# Patient Record
Sex: Female | Born: 1935 | Race: White | Hispanic: No | Smoking: Never smoker
Health system: Southern US, Community
[De-identification: ages and names within clinical notes are randomized; demographics above are authoritative.]

## PROBLEM LIST (undated history)

## (undated) DIAGNOSIS — N184 Chronic kidney disease, stage 4 (severe): Secondary | ICD-10-CM

## (undated) DIAGNOSIS — E785 Hyperlipidemia, unspecified: Secondary | ICD-10-CM

## (undated) DIAGNOSIS — J189 Pneumonia, unspecified organism: Secondary | ICD-10-CM

## (undated) DIAGNOSIS — I4891 Unspecified atrial fibrillation: Secondary | ICD-10-CM

## (undated) DIAGNOSIS — I10 Essential (primary) hypertension: Secondary | ICD-10-CM

## (undated) DIAGNOSIS — H9190 Unspecified hearing loss, unspecified ear: Secondary | ICD-10-CM

## (undated) DIAGNOSIS — I272 Pulmonary hypertension, unspecified: Secondary | ICD-10-CM

## (undated) DIAGNOSIS — D649 Anemia, unspecified: Secondary | ICD-10-CM

## (undated) DIAGNOSIS — I739 Peripheral vascular disease, unspecified: Secondary | ICD-10-CM

## (undated) DIAGNOSIS — K219 Gastro-esophageal reflux disease without esophagitis: Secondary | ICD-10-CM

## (undated) DIAGNOSIS — I119 Hypertensive heart disease without heart failure: Secondary | ICD-10-CM

## (undated) DIAGNOSIS — N289 Disorder of kidney and ureter, unspecified: Secondary | ICD-10-CM

## (undated) DIAGNOSIS — R42 Dizziness and giddiness: Secondary | ICD-10-CM

## (undated) DIAGNOSIS — I509 Heart failure, unspecified: Secondary | ICD-10-CM

## (undated) DIAGNOSIS — I2510 Atherosclerotic heart disease of native coronary artery without angina pectoris: Secondary | ICD-10-CM

## (undated) HISTORY — DX: Heart failure, unspecified: I50.9

## (undated) HISTORY — DX: Hyperlipidemia, unspecified: E78.5

## (undated) HISTORY — DX: Anemia, unspecified: D64.9

## (undated) HISTORY — DX: Peripheral vascular disease, unspecified: I73.9

---
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
## 2004-12-04 ENCOUNTER — Ambulatory Visit: Payer: Self-pay | Admitting: Family Medicine

## 2006-03-15 ENCOUNTER — Emergency Department: Payer: Self-pay | Admitting: Internal Medicine

## 2006-03-15 ENCOUNTER — Other Ambulatory Visit: Payer: Self-pay

## 2006-10-31 ENCOUNTER — Other Ambulatory Visit: Payer: Self-pay

## 2006-11-01 ENCOUNTER — Inpatient Hospital Stay: Payer: Self-pay | Admitting: Internal Medicine

## 2007-04-03 ENCOUNTER — Ambulatory Visit: Payer: Self-pay | Admitting: Internal Medicine

## 2007-05-26 ENCOUNTER — Ambulatory Visit: Payer: Self-pay | Admitting: Internal Medicine

## 2007-06-30 ENCOUNTER — Ambulatory Visit: Payer: Self-pay | Admitting: Internal Medicine

## 2007-08-03 ENCOUNTER — Ambulatory Visit: Payer: Self-pay | Admitting: Vascular Surgery

## 2009-06-13 ENCOUNTER — Ambulatory Visit: Payer: Self-pay | Admitting: Unknown Physician Specialty

## 2009-10-16 ENCOUNTER — Inpatient Hospital Stay: Payer: Self-pay | Admitting: Vascular Surgery

## 2010-08-01 ENCOUNTER — Ambulatory Visit: Payer: Self-pay | Admitting: Internal Medicine

## 2010-09-05 ENCOUNTER — Ambulatory Visit: Payer: Self-pay | Admitting: Otolaryngology

## 2010-11-07 ENCOUNTER — Ambulatory Visit: Payer: Self-pay | Admitting: Family Medicine

## 2010-11-08 ENCOUNTER — Inpatient Hospital Stay: Payer: Self-pay | Admitting: Surgery

## 2011-03-16 ENCOUNTER — Ambulatory Visit: Payer: Self-pay | Admitting: Internal Medicine

## 2011-03-17 ENCOUNTER — Inpatient Hospital Stay: Payer: Self-pay | Admitting: Internal Medicine

## 2011-05-07 ENCOUNTER — Ambulatory Visit: Payer: Self-pay | Admitting: Gastroenterology

## 2011-05-10 LAB — PATHOLOGY REPORT

## 2014-03-21 DIAGNOSIS — G5600 Carpal tunnel syndrome, unspecified upper limb: Secondary | ICD-10-CM | POA: Insufficient documentation

## 2014-08-15 ENCOUNTER — Ambulatory Visit: Payer: Self-pay | Admitting: Physician Assistant

## 2014-08-27 ENCOUNTER — Ambulatory Visit: Payer: Self-pay | Admitting: Family Medicine

## 2014-09-03 ENCOUNTER — Emergency Department: Payer: Self-pay | Admitting: Emergency Medicine

## 2014-09-03 LAB — COMPREHENSIVE METABOLIC PANEL
Albumin: 3.1 g/dL — ABNORMAL LOW (ref 3.4–5.0)
Alkaline Phosphatase: 85 U/L
Anion Gap: 6 — ABNORMAL LOW (ref 7–16)
BUN: 34 mg/dL — ABNORMAL HIGH (ref 7–18)
Bilirubin,Total: 0.5 mg/dL (ref 0.2–1.0)
Calcium, Total: 8.6 mg/dL (ref 8.5–10.1)
Chloride: 107 mmol/L (ref 98–107)
Co2: 27 mmol/L (ref 21–32)
Creatinine: 1.66 mg/dL — ABNORMAL HIGH (ref 0.60–1.30)
EGFR (African American): 38 — ABNORMAL LOW
EGFR (Non-African Amer.): 31 — ABNORMAL LOW
Glucose: 113 mg/dL — ABNORMAL HIGH (ref 65–99)
Osmolality: 288 (ref 275–301)
Potassium: 4.4 mmol/L (ref 3.5–5.1)
SGOT(AST): 14 U/L — ABNORMAL LOW (ref 15–37)
SGPT (ALT): 9 U/L — ABNORMAL LOW
Sodium: 140 mmol/L (ref 136–145)
Total Protein: 7.1 g/dL (ref 6.4–8.2)

## 2014-09-03 LAB — CBC
HCT: 25.1 % — ABNORMAL LOW (ref 35.0–47.0)
HGB: 8.2 g/dL — ABNORMAL LOW (ref 12.0–16.0)
MCH: 27 pg (ref 26.0–34.0)
MCHC: 32.6 g/dL (ref 32.0–36.0)
MCV: 83 fL (ref 80–100)
Platelet: 158 10*3/uL (ref 150–440)
RBC: 3.04 10*6/uL — ABNORMAL LOW (ref 3.80–5.20)
RDW: 16.2 % — ABNORMAL HIGH (ref 11.5–14.5)
WBC: 5.6 10*3/uL (ref 3.6–11.0)

## 2014-09-08 ENCOUNTER — Encounter: Payer: Self-pay | Admitting: Surgery

## 2014-09-10 ENCOUNTER — Emergency Department: Payer: Self-pay | Admitting: Emergency Medicine

## 2014-09-15 ENCOUNTER — Encounter: Payer: Self-pay | Admitting: Surgery

## 2014-09-22 ENCOUNTER — Encounter: Payer: Self-pay | Admitting: Surgery

## 2014-09-22 DIAGNOSIS — I872 Venous insufficiency (chronic) (peripheral): Secondary | ICD-10-CM | POA: Insufficient documentation

## 2014-09-22 DIAGNOSIS — I272 Pulmonary hypertension, unspecified: Secondary | ICD-10-CM | POA: Insufficient documentation

## 2014-10-03 ENCOUNTER — Encounter: Payer: Self-pay | Admitting: Surgery

## 2014-10-20 DIAGNOSIS — I340 Nonrheumatic mitral (valve) insufficiency: Secondary | ICD-10-CM | POA: Insufficient documentation

## 2014-10-20 DIAGNOSIS — I350 Nonrheumatic aortic (valve) stenosis: Secondary | ICD-10-CM | POA: Insufficient documentation

## 2014-10-20 DIAGNOSIS — I071 Rheumatic tricuspid insufficiency: Secondary | ICD-10-CM | POA: Insufficient documentation

## 2014-11-01 ENCOUNTER — Encounter: Payer: Self-pay | Admitting: Surgery

## 2015-01-05 DIAGNOSIS — I10 Essential (primary) hypertension: Secondary | ICD-10-CM | POA: Insufficient documentation

## 2016-09-12 ENCOUNTER — Encounter: Payer: Self-pay | Admitting: *Deleted

## 2016-09-12 ENCOUNTER — Inpatient Hospital Stay
Admission: EM | Admit: 2016-09-12 | Discharge: 2016-09-16 | DRG: 871 | Disposition: A | Discharge status: Home / Self Care | Payer: Medicare Other | Attending: Internal Medicine | Admitting: Internal Medicine

## 2016-09-12 ENCOUNTER — Emergency Department: Payer: Medicare Other

## 2016-09-12 DIAGNOSIS — A419 Sepsis, unspecified organism: Secondary | ICD-10-CM | POA: Diagnosis not present

## 2016-09-12 DIAGNOSIS — R0602 Shortness of breath: Secondary | ICD-10-CM | POA: Diagnosis not present

## 2016-09-12 DIAGNOSIS — J9601 Acute respiratory failure with hypoxia: Secondary | ICD-10-CM | POA: Diagnosis present

## 2016-09-12 DIAGNOSIS — I129 Hypertensive chronic kidney disease with stage 1 through stage 4 chronic kidney disease, or unspecified chronic kidney disease: Secondary | ICD-10-CM | POA: Diagnosis present

## 2016-09-12 DIAGNOSIS — M6281 Muscle weakness (generalized): Secondary | ICD-10-CM

## 2016-09-12 DIAGNOSIS — R0902 Hypoxemia: Secondary | ICD-10-CM

## 2016-09-12 DIAGNOSIS — N179 Acute kidney failure, unspecified: Secondary | ICD-10-CM | POA: Diagnosis present

## 2016-09-12 DIAGNOSIS — R262 Difficulty in walking, not elsewhere classified: Secondary | ICD-10-CM

## 2016-09-12 DIAGNOSIS — N189 Chronic kidney disease, unspecified: Secondary | ICD-10-CM | POA: Diagnosis present

## 2016-09-12 DIAGNOSIS — R2681 Unsteadiness on feet: Secondary | ICD-10-CM

## 2016-09-12 DIAGNOSIS — J189 Pneumonia, unspecified organism: Secondary | ICD-10-CM | POA: Diagnosis present

## 2016-09-12 DIAGNOSIS — Z79899 Other long term (current) drug therapy: Secondary | ICD-10-CM | POA: Diagnosis not present

## 2016-09-12 DIAGNOSIS — J181 Lobar pneumonia, unspecified organism: Secondary | ICD-10-CM

## 2016-09-12 DIAGNOSIS — R0781 Pleurodynia: Secondary | ICD-10-CM

## 2016-09-12 HISTORY — DX: Essential (primary) hypertension: I10

## 2016-09-12 LAB — CBC WITH DIFFERENTIAL/PLATELET
Basophils Absolute: 0.1 10*3/uL (ref 0–0.1)
Basophils Relative: 1 %
Eosinophils Absolute: 0 10*3/uL (ref 0–0.7)
Eosinophils Relative: 0 %
HCT: 30.6 % — ABNORMAL LOW (ref 35.0–47.0)
Hemoglobin: 10.4 g/dL — ABNORMAL LOW (ref 12.0–16.0)
Lymphocytes Relative: 13 %
Lymphs Abs: 1 10*3/uL (ref 1.0–3.6)
MCH: 27.9 pg (ref 26.0–34.0)
MCHC: 33.8 g/dL (ref 32.0–36.0)
MCV: 82.4 fL (ref 80.0–100.0)
Monocytes Absolute: 1.2 10*3/uL — ABNORMAL HIGH (ref 0.2–0.9)
Monocytes Relative: 15 %
Neutro Abs: 5.4 10*3/uL (ref 1.4–6.5)
Neutrophils Relative %: 71 %
Platelets: 102 10*3/uL — ABNORMAL LOW (ref 150–440)
RBC: 3.72 MIL/uL — ABNORMAL LOW (ref 3.80–5.20)
RDW: 15.8 % — ABNORMAL HIGH (ref 11.5–14.5)
WBC: 7.7 10*3/uL (ref 3.6–11.0)

## 2016-09-12 LAB — COMPREHENSIVE METABOLIC PANEL
ALT: 10 U/L — ABNORMAL LOW (ref 14–54)
AST: 23 U/L (ref 15–41)
Albumin: 3.8 g/dL (ref 3.5–5.0)
Alkaline Phosphatase: 72 U/L (ref 38–126)
Anion gap: 12 (ref 5–15)
BUN: 33 mg/dL — ABNORMAL HIGH (ref 6–20)
CO2: 21 mmol/L — ABNORMAL LOW (ref 22–32)
Calcium: 8.3 mg/dL — ABNORMAL LOW (ref 8.9–10.3)
Chloride: 104 mmol/L (ref 101–111)
Creatinine, Ser: 1.38 mg/dL — ABNORMAL HIGH (ref 0.44–1.00)
GFR calc Af Amer: 39 mL/min — ABNORMAL LOW (ref >60)
GFR calc non Af Amer: 34 mL/min — ABNORMAL LOW (ref >60)
Glucose, Bld: 126 mg/dL — ABNORMAL HIGH (ref 65–99)
Potassium: 3.7 mmol/L (ref 3.5–5.1)
Sodium: 137 mmol/L (ref 135–145)
Total Bilirubin: 0.5 mg/dL (ref 0.3–1.2)
Total Protein: 7.5 g/dL (ref 6.5–8.1)

## 2016-09-12 LAB — CBC
HCT: 29 % — ABNORMAL LOW (ref 35.0–47.0)
Hemoglobin: 9.7 g/dL — ABNORMAL LOW (ref 12.0–16.0)
MCH: 27.8 pg (ref 26.0–34.0)
MCHC: 33.5 g/dL (ref 32.0–36.0)
MCV: 83 fL (ref 80.0–100.0)
Platelets: 100 10*3/uL — ABNORMAL LOW (ref 150–440)
RBC: 3.49 MIL/uL — ABNORMAL LOW (ref 3.80–5.20)
RDW: 15.9 % — ABNORMAL HIGH (ref 11.5–14.5)
WBC: 11.6 10*3/uL — ABNORMAL HIGH (ref 3.6–11.0)

## 2016-09-12 LAB — CREATININE, SERUM
Creatinine, Ser: 1.48 mg/dL — ABNORMAL HIGH (ref 0.44–1.00)
GFR calc Af Amer: 36 mL/min — ABNORMAL LOW (ref >60)
GFR calc non Af Amer: 31 mL/min — ABNORMAL LOW (ref >60)

## 2016-09-12 LAB — RAPID INFLUENZA A&B ANTIGENS
Influenza A (ARMC): NEGATIVE
Influenza B (ARMC): NEGATIVE

## 2016-09-12 LAB — LACTIC ACID, PLASMA: Lactic Acid, Venous: 0.7 mmol/L (ref 0.5–1.9)

## 2016-09-12 LAB — GLUCOSE, CAPILLARY: Glucose-Capillary: 96 mg/dL (ref 65–99)

## 2016-09-12 MED ORDER — ACETAMINOPHEN 500 MG PO TABS
ORAL_TABLET | ORAL | Status: AC
  Administered 2016-09-12: 1000 mg via ORAL
  Filled 2016-09-12: qty 2

## 2016-09-12 MED ORDER — ACETAMINOPHEN 500 MG PO TABS
1000.0000 mg | ORAL_TABLET | Freq: Once | ORAL | Status: AC
Start: 2016-09-12 — End: 2016-09-12
  Administered 2016-09-12: 1000 mg via ORAL

## 2016-09-12 MED ORDER — IRBESARTAN 75 MG PO TABS
300.0000 mg | ORAL_TABLET | Freq: Every day | ORAL | Status: DC
  Administered 2016-09-12 – 2016-09-13 (×2): 300 mg via ORAL
  Filled 2016-09-12: qty 1
  Filled 2016-09-12 (×2): qty 4

## 2016-09-12 MED ORDER — DEXTROSE 5 % IV SOLN
500.0000 mg | INTRAVENOUS | Status: DC
  Administered 2016-09-12 – 2016-09-15 (×4): 500 mg via INTRAVENOUS
  Filled 2016-09-12 (×5): qty 500

## 2016-09-12 MED ORDER — CEFTRIAXONE SODIUM-DEXTROSE 1-3.74 GM-% IV SOLR
INTRAVENOUS | Status: DC
Start: 2016-09-12 — End: 2016-09-12
  Filled 2016-09-12: qty 50

## 2016-09-12 MED ORDER — DEXTROSE 5 % IV SOLN
INTRAVENOUS | Status: AC
  Administered 2016-09-12: 500 mg via INTRAVENOUS
  Filled 2016-09-12: qty 500

## 2016-09-12 MED ORDER — IPRATROPIUM-ALBUTEROL 0.5-2.5 (3) MG/3ML IN SOLN
3.0000 mL | RESPIRATORY_TRACT | Status: DC
Start: 2016-09-12 — End: 2016-09-13
  Administered 2016-09-12 – 2016-09-13 (×3): 3 mL via RESPIRATORY_TRACT
  Filled 2016-09-12 (×3): qty 3

## 2016-09-12 MED ORDER — SODIUM CHLORIDE 0.9 % IV BOLUS (SEPSIS)
500.0000 mL | Freq: Once | INTRAVENOUS | Status: AC
  Administered 2016-09-12: 500 mL via INTRAVENOUS

## 2016-09-12 MED ORDER — AMLODIPINE BESYLATE 10 MG PO TABS
10.0000 mg | ORAL_TABLET | Freq: Every day | ORAL | Status: DC
  Administered 2016-09-12 – 2016-09-16 (×5): 10 mg via ORAL
  Filled 2016-09-12 (×5): qty 1

## 2016-09-12 MED ORDER — SODIUM CHLORIDE 0.9% FLUSH
3.0000 mL | Freq: Two times a day (BID) | INTRAVENOUS | Status: DC
  Administered 2016-09-12 – 2016-09-16 (×8): 3 mL via INTRAVENOUS

## 2016-09-12 MED ORDER — ONDANSETRON 4 MG PO TBDP
4.0000 mg | ORAL_TABLET | Freq: Once | ORAL | Status: AC
  Administered 2016-09-12: 4 mg via ORAL
  Filled 2016-09-12: qty 1

## 2016-09-12 MED ORDER — VANCOMYCIN HCL IN DEXTROSE 1-5 GM/200ML-% IV SOLN
INTRAVENOUS | Status: AC
  Filled 2016-09-12: qty 200

## 2016-09-12 MED ORDER — CLONIDINE HCL 0.3 MG/24HR TD PTWK
0.3000 mg | MEDICATED_PATCH | TRANSDERMAL | Status: DC
  Filled 2016-09-12: qty 1

## 2016-09-12 MED ORDER — ACETAMINOPHEN 325 MG PO TABS
650.0000 mg | ORAL_TABLET | Freq: Four times a day (QID) | ORAL | Status: DC | PRN
  Administered 2016-09-12: 650 mg via ORAL
  Filled 2016-09-12: qty 2

## 2016-09-12 MED ORDER — PIPERACILLIN-TAZOBACTAM 3.375 G IVPB 30 MIN
3.3750 g | Freq: Once | INTRAVENOUS | Status: AC
  Administered 2016-09-12: 3.375 g via INTRAVENOUS
  Filled 2016-09-12: qty 50

## 2016-09-12 MED ORDER — ONDANSETRON HCL 4 MG PO TABS: 4.0000 mg | ORAL_TABLET | Freq: Four times a day (QID) | ORAL | Status: DC | PRN

## 2016-09-12 MED ORDER — CEFTRIAXONE SODIUM 1 G IJ SOLR: 1.0000 g | INTRAMUSCULAR | Status: DC

## 2016-09-12 MED ORDER — GUAIFENESIN ER 600 MG PO TB12
600.0000 mg | ORAL_TABLET | Freq: Two times a day (BID) | ORAL | Status: DC
  Administered 2016-09-12 – 2016-09-16 (×8): 600 mg via ORAL
  Filled 2016-09-12 (×11): qty 1

## 2016-09-12 MED ORDER — CARVEDILOL 6.25 MG PO TABS
6.2500 mg | ORAL_TABLET | Freq: Two times a day (BID) | ORAL | Status: DC
  Administered 2016-09-12 – 2016-09-13 (×2): 6.25 mg via ORAL
  Filled 2016-09-12 (×3): qty 1

## 2016-09-12 MED ORDER — ONDANSETRON HCL 4 MG/2ML IJ SOLN: 4.0000 mg | Freq: Four times a day (QID) | INTRAMUSCULAR | Status: DC | PRN

## 2016-09-12 MED ORDER — ENOXAPARIN SODIUM 30 MG/0.3ML ~~LOC~~ SOLN
30.0000 mg | SUBCUTANEOUS | Status: DC
  Administered 2016-09-12 – 2016-09-15 (×4): 30 mg via SUBCUTANEOUS
  Filled 2016-09-12 (×4): qty 0.3

## 2016-09-12 MED ORDER — TRAMADOL HCL 50 MG PO TABS: 50.0000 mg | ORAL_TABLET | Freq: Four times a day (QID) | ORAL | Status: DC | PRN

## 2016-09-12 MED ORDER — PANTOPRAZOLE SODIUM 40 MG PO TBEC
40.0000 mg | DELAYED_RELEASE_TABLET | Freq: Every day | ORAL | Status: DC
  Administered 2016-09-12 – 2016-09-16 (×5): 40 mg via ORAL
  Filled 2016-09-12 (×5): qty 1

## 2016-09-12 MED ORDER — IPRATROPIUM-ALBUTEROL 0.5-2.5 (3) MG/3ML IN SOLN
3.0000 mL | Freq: Once | RESPIRATORY_TRACT | Status: AC
  Administered 2016-09-12: 3 mL via RESPIRATORY_TRACT

## 2016-09-12 MED ORDER — IPRATROPIUM-ALBUTEROL 0.5-2.5 (3) MG/3ML IN SOLN
RESPIRATORY_TRACT | Status: AC
  Filled 2016-09-12: qty 3

## 2016-09-12 MED ORDER — CEFTRIAXONE SODIUM-DEXTROSE 1-3.74 GM-% IV SOLR
1.0000 g | INTRAVENOUS | Status: DC
  Administered 2016-09-12 – 2016-09-15 (×4): 1 g via INTRAVENOUS
  Filled 2016-09-12 (×5): qty 50

## 2016-09-12 MED ORDER — ACETAMINOPHEN 650 MG RE SUPP: 650.0000 mg | Freq: Four times a day (QID) | RECTAL | Status: DC | PRN

## 2016-09-12 MED ORDER — VANCOMYCIN HCL IN DEXTROSE 1-5 GM/200ML-% IV SOLN
1000.0000 mg | Freq: Once | INTRAVENOUS | Status: AC
  Administered 2016-09-12: 1000 mg via INTRAVENOUS
  Filled 2016-09-12: qty 200

## 2016-09-12 MED ORDER — HYDROCOD POLST-CPM POLST ER 10-8 MG/5ML PO SUER
5.0000 mL | Freq: Two times a day (BID) | ORAL | Status: DC
  Administered 2016-09-12 – 2016-09-16 (×9): 5 mL via ORAL
  Filled 2016-09-12 (×10): qty 5

## 2016-09-12 MED ORDER — ALBUTEROL SULFATE (2.5 MG/3ML) 0.083% IN NEBU: 2.5000 mg | INHALATION_SOLUTION | Freq: Four times a day (QID) | RESPIRATORY_TRACT | Status: DC

## 2016-09-12 MED ORDER — IPRATROPIUM-ALBUTEROL 0.5-2.5 (3) MG/3ML IN SOLN
RESPIRATORY_TRACT | Status: AC
  Administered 2016-09-12: 3 mL via RESPIRATORY_TRACT
  Filled 2016-09-12: qty 3

## 2016-09-12 NOTE — ED Provider Notes (Signed)
Time Seen: Approximately 0915 I have reviewed the triage notes  Chief Complaint: Fever and Cough   History of Present Illness: Felicia [name] is a 83 y.o. female who presents with acute onset no fever, productive cough, and some shortness of breath. Patient was transported here by EMS from home. She arrives with a temperature of 102.9 orally and pulse ox of 89% on room air. She denies any chest pain. She describes her cough is "" wet, laterally "" cough. She denies any hemoptysis though green tinged sputum. She denies any pulmonary emboli risk factors. She states she felt fine the previous evening.   Past Medical History:  Diagnosis Date  . Hypertension     There are no active problems to display for this patient.   History reviewed. No pertinent surgical history.  History reviewed. No pertinent surgical history.    Allergies:  Patient has no known allergies.  Family History: No family history on file.  Social History: Social History  Substance Use Topics  . Smoking status: Never Smoker  . Smokeless tobacco: Never Used  . 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   Review of Systems:   10 point review of systems was performed and was otherwise negative:  Constitutional: Fever noted above Eyes: No visual disturbances ENT: No sore throat, ear pain Cardiac: No chest pain Respiratory: Shortness of breath especially while lying flat Abdomen: No abdominal pain, no vomiting, No diarrhea Endocrine: No weight loss, No night sweats Extremities: No peripheral edema, cyanosis Skin: No rashes, easy bruising Neurologic: No focal weakness, trouble with speech or swollowing Urologic: No dysuria, Hematuria, or urinary frequency   Physical Exam:  ED Triage Vitals  Enc Vitals Group     BP 09/12/16 0836 (!) 208/76     Pulse Rate 09/12/16 0836 98     Resp 09/12/16 0836 18     Temp 09/12/16 0836 (!) 102.9 F (39.4 C)     Temp  Source 09/12/16 0836 Oral     SpO2 09/12/16 0836 (!) 89 %     Weight 09/12/16 0837 190 lb (86.2 kg)     Height 09/12/16 0837 5\' 9"  (1.753 m)     Head Circumference --      Peak Flow --      Pain Score --      Pain Loc --      Pain Edu? --      Excl. in [place]? --     General: Awake , Alert , and Oriented times 3; GCS 15 Patient slightly tachypneic though able to speak in full and complete sentences with wet sounding cough, no wheezing Head: Normal cephalic , atraumatic Eyes: Pupils equal , round, reactive to light Nose/Throat: No nasal drainage, patent upper airway without erythema or exudate.  Neck: Supple, Full range of motion, No anterior adenopathy or palpable thyroid masses. No meningeal signs Lungs: Coarse breath sounds auscultated bilaterally especially at the bases without wheezing noted  Heart: Regular rate, regular rhythm without murmurs , gallops , or rubs Abdomen: Soft, non tender without rebound, guarding , or rigidity; bowel sounds positive and symmetric in all 4 quadrants. No organomegaly .        Extremities: 2 plus symmetric pulses. No edema, clubbing or cyanosis Neurologic: normal ambulation, Motor symmetric without deficits, sensory intact Skin: warm, dry, no rashes   Labs:   All laboratory work was reviewed including any pertinent negatives or positives listed below:  Labs  Reviewed  CBC WITH DIFFERENTIAL/PLATELET - Abnormal; Notable for the following:       Result Value   RBC 3.72 (*)    Hemoglobin 10.4 (*)    HCT 30.6 (*)    RDW 15.8 (*)    Platelets 102 (*)    Monocytes Absolute 1.2 (*)    All other components within normal limits  COMPREHENSIVE METABOLIC PANEL - Abnormal; Notable for the following:    CO2 21 (*)    Glucose, Bld 126 (*)    BUN 33 (*)    Creatinine, Ser 1.38 (*)    Calcium 8.3 (*)    ALT 10 (*)    GFR calc non Af Amer 34 (*)    GFR calc Af Amer 39 (*)    All other components within normal limits  RAPID INFLUENZA A&B ANTIGENS ([facility]  ONLY)  CULTURE, BLOOD (ROUTINE X 2)  CULTURE, BLOOD (ROUTINE X 2)  LACTIC ACID, PLASMA    Radiology:  "Dg Chest 2 View  Result Date: 09/12/2016 CLINICAL DATA:  Productive cough, fever. EXAM: CHEST  2 VIEW COMPARISON:  Radiographs of March 17, 2011. FINDINGS: Stable cardiomediastinal silhouette. Atherosclerosis of thoracic aorta is noted. No pneumothorax is noted. Right lung is clear. Mild left basilar opacity is noted concerning for pneumonia or atelectasis. Bony thorax is unremarkable. IMPRESSION: Left basilar pneumonia or atelectasis. Aortic atherosclerosis. Followup PA and lateral chest X-ray is recommended in 3-4 weeks following trial of antibiotic therapy to ensure resolution and exclude underlying malignancy. Electronically Signed   By: Marijo Conception, M.D.   On: 09/12/2016 09:27  "  I personally reviewed the radiologic studies    ED Course:  Patient arrives slightly hypoxic. Patient has the appearance of community-acquired pneumonia with fever and infiltrate seen on chest x-ray with a negative flu study. Patient was placed on a 2 L nasal cannula which improved her pulse ox up into the mid 90s. She was given a DuoNeb had been started on Zosyn and IV vancomycin with thoughts we may be able to treat and discharge her. However, after the breathing treatment and antibiotics she was ambulated here in emergency department her pulse ox dropped to 76% on room air. Clinical Course      Assessment:  Community-acquired pneumonia Hypoxia      Plan:  Inpatient            Daymon Larsen, MD 09/12/16 1241

## 2016-09-12 NOTE — ED Notes (Signed)
Pt resting in bed, resp even and unlabored, family at bedside

## 2016-09-12 NOTE — ED Notes (Signed)
Patient ambulated in hall with no oxygen. Patient SOB and SpO2 dropped to 76%. Patient returned to room and placed on 2L [place]. SpO2 up to 95%.

## 2016-09-12 NOTE — ED Triage Notes (Signed)
Pt arrives via EMS from home with a fever and productive green cough, pt awake upon arrival, states symptoms began this AM, EDP at bedside, rhonchi in lung fields

## 2016-09-12 NOTE — ED Notes (Signed)
Soiled brief changed at this time. Clean, dry brief applied. Linens changed.

## 2016-09-12 NOTE — ED Notes (Signed)
Pt placed on 2L [place]

## 2016-09-12 NOTE — ED Notes (Signed)
Pt sleeping in room on 2L via [place]. Pt's sat 86%. Pt increased to 4L via [place].

## 2016-09-12 NOTE — H&P (Signed)
[place] at [place] NAME: Felicia [name]    MR#:  [id number]  DATE OF BIRTH:  [birth year]  DATE OF ADMISSION:  09/12/2016  PRIMARY CARE PHYSICIAN: No primary care provider on file.   REQUESTING/REFERRING PHYSICIAN:   CHIEF COMPLAINT:   Chief Complaint  Patient presents with  . Fever  . Cough    HISTORY OF PRESENT ILLNESS: Felicia [name]  is a 83 y.o. female with a known history of Essential hypertension, who presents to the hospital with complaints of cough, yellow sputum production, shortness of breath, wheezing, left-sided lower thoracic pains, chills. On arrival to emergency room, she was noted to have high fever to 102.9, tachycardic, which is x-ray revealed pneumonia, hospitalist services were contacted for admission  PAST MEDICAL HISTORY:   Past Medical History:  Diagnosis Date  . Hypertension     PAST SURGICAL HISTORY: History reviewed. No pertinent surgical history.  SOCIAL HISTORY:  Social History  Substance Use Topics  . Smoking status: Never Smoker  . Smokeless tobacco: Never Used  . Alcohol use No    FAMILY HISTORY: No family history on file.  DRUG ALLERGIES: No Known Allergies  Review of Systems  Constitutional: Positive for chills. Negative for fever and weight loss.  HENT: Negative for congestion.   Eyes: Negative for blurred vision and double vision.  Respiratory: Positive for cough, sputum production, shortness of breath and wheezing.   Cardiovascular: Positive for chest pain. Negative for palpitations, orthopnea, leg swelling and PND.  Gastrointestinal: Positive for nausea. Negative for abdominal pain, blood in stool, constipation, diarrhea and vomiting.  Genitourinary: Negative for dysuria, frequency, hematuria and urgency.  Musculoskeletal: Negative for falls.  Neurological: Positive for weakness. Negative for dizziness, tremors, focal weakness and headaches.  Endo/Heme/Allergies: Does not  bruise/bleed easily.  Psychiatric/Behavioral: Negative for depression. The patient does not have insomnia.     MEDICATIONS AT HOME:  Prior to Admission medications   Medication Sig Start Date End Date Taking? Authorizing Provider  amLODipine (NORVASC) 10 MG tablet Take 10 mg by mouth daily.   Yes Historical Provider, MD  carvedilol (COREG) 6.25 MG tablet Take 6.25 mg by mouth 2 (two) times daily with a meal.   Yes Historical Provider, MD  cloNIDine (CATAPRES - DOSED IN MG/24 HR) 0.3 mg/24hr patch Place 0.3 mg onto the skin once a week.   Yes Historical Provider, MD  Ferrous Sulfate (IRON) 325 (65 Fe) MG TABS Take by mouth.   Yes Historical Provider, MD  omeprazole (PRILOSEC) 20 MG capsule Take 20 mg by mouth daily.   Yes Historical Provider, MD  telmisartan (MICARDIS) 80 MG tablet Take 80 mg by mouth daily.   Yes Historical Provider, MD      PHYSICAL EXAMINATION:   VITAL SIGNS: Blood pressure (!) 141/41, pulse 75, temperature 98.8 F (37.1 C), temperature source Oral, resp. rate 15, height 5\' 9"  (1.753 m), weight 86.2 kg (190 lb), SpO2 95 %.  GENERAL:  83 y.o.-year-old patient lying in the bed In mild-to-moderate respiratory distress due to dyspnea and intermittent cough, rattling in the chest.  EYES: Pupils equal, round, reactive to light and accommodation. No scleral icterus. Extraocular muscles intact.  HEENT: Head atraumatic, normocephalic. Oropharynx and nasopharynx clear.  NECK:  Supple, no jugular venous distention. No thyroid enlargement, no tenderness.  LUNGS: Some diminished breath sounds bilaterally, no wheezing, bilateral copious rales,rhonchi and crepitations noted. Intermittent use of accessory muscles of respiration, with movements and speech.  CARDIOVASCULAR: S1,  S2 , tachycardic. No murmurs, rubs, or gallops.  ABDOMEN: Soft, nontender, nondistended. Bowel sounds present. No organomegaly or mass.  EXTREMITIES: No pedal edema, cyanosis, or clubbing.  NEUROLOGIC: Cranial  nerves II through XII are intact. Muscle strength 5/5 in all extremities. Sensation intact. Gait not checked.  PSYCHIATRIC: The patient is alert and oriented x 3.  SKIN: No obvious rash, lesion, or ulcer.   LABORATORY PANEL:   CBC  Recent Labs Lab 09/12/16 0843  WBC 7.7  HGB 10.4*  HCT 30.6*  PLT 102*  MCV 82.4  MCH 27.9  MCHC 33.8  RDW 15.8*  LYMPHSABS 1.0  MONOABS 1.2*  EOSABS 0.0  BASOSABS 0.1   ------------------------------------------------------------------------------------------------------------------  Chemistries   Recent Labs Lab 09/12/16 0843  NA 137  K 3.7  CL 104  CO2 21*  GLUCOSE 126*  BUN 33*  CREATININE 1.38*  CALCIUM 8.3*  AST 23  ALT 10*  ALKPHOS 72  BILITOT 0.5   ------------------------------------------------------------------------------------------------------------------  Cardiac Enzymes No results for input(s): TROPONINI in the last 168 hours. ------------------------------------------------------------------------------------------------------------------  RADIOLOGY: Dg Chest 2 View  Result Date: 09/12/2016 CLINICAL DATA:  Productive cough, fever. EXAM: CHEST  2 VIEW COMPARISON:  Radiographs of March 17, 2011. FINDINGS: Stable cardiomediastinal silhouette. Atherosclerosis of thoracic aorta is noted. No pneumothorax is noted. Right lung is clear. Mild left basilar opacity is noted concerning for pneumonia or atelectasis. Bony thorax is unremarkable. IMPRESSION: Left basilar pneumonia or atelectasis. Aortic atherosclerosis. Followup PA and lateral chest X-ray is recommended in 3-4 weeks following trial of antibiotic therapy to ensure resolution and exclude underlying malignancy. Electronically Signed   By: Marijo Conception, M.D.   On: 09/12/2016 09:27    EKG: Orders placed or performed in visit on 10/31/06  . EKG 12-Lead    IMPRESSION AND PLAN:  Active Problems:   Sepsis ([place])   Community acquired pneumonia   Hypoxia    Pleuritic chest pain   Pneumonia  #1. Sepsis due to community-acquired pneumonia, admit patient to medical floor, initiated on Rocephin and Zithromax,, get sputum culture #2. Community-acquired pneumonia, continue antibiotic therapy, sputum culture, just antibiotics depending on culture results #3. Hypoxia, oxygen therapy, wean off oxygen as tolerated, patient is not on oxygen at home #4. Chest pain, pleuritic, due to left-sided pneumonia, supportive therapy, tramadol when necessary #5 chronic renal insufficiency, seems to be stable since 2016 #6. Malignant essential hypertension, continue outpatient medications, advanced him as needed  All the records are reviewed and case discussed with ED provider. Management plans discussed with the patient, family and they are in agreement.  CODE STATUS: Code Status History    This patient does not have a recorded code status. Please follow your organizational policy for patients in this situation.       TOTAL TIME TAKING CARE OF THIS PATIENT: 50 minutes.    Theodoro Grist M.D on 09/12/2016 at 1:33 PM  Between 7am to 6pm - Pager - [phone] After 6pm go to [email or web address] - password [email or web address] [facility]  [facility]  Office  [phone]  CC: Primary care physician; No primary care provider on file.

## 2016-09-13 LAB — BLOOD CULTURE ID PANEL (REFLEXED)

## 2016-09-13 LAB — BASIC METABOLIC PANEL
Anion gap: 7 (ref 5–15)
BUN: 42 mg/dL — ABNORMAL HIGH (ref 6–20)
CO2: 25 mmol/L (ref 22–32)
Calcium: 7.5 mg/dL — ABNORMAL LOW (ref 8.9–10.3)
Chloride: 105 mmol/L (ref 101–111)
Creatinine, Ser: 1.86 mg/dL — ABNORMAL HIGH (ref 0.44–1.00)
GFR calc Af Amer: 27 mL/min — ABNORMAL LOW (ref >60)
GFR calc non Af Amer: 24 mL/min — ABNORMAL LOW (ref >60)
Glucose, Bld: 99 mg/dL (ref 65–99)
Potassium: 3.7 mmol/L (ref 3.5–5.1)
Sodium: 137 mmol/L (ref 135–145)

## 2016-09-13 LAB — CBC
HCT: 28.2 % — ABNORMAL LOW (ref 35.0–47.0)
Hemoglobin: 9.4 g/dL — ABNORMAL LOW (ref 12.0–16.0)
MCH: 27.5 pg (ref 26.0–34.0)
MCHC: 33.3 g/dL (ref 32.0–36.0)
MCV: 82.5 fL (ref 80.0–100.0)
Platelets: 93 10*3/uL — ABNORMAL LOW (ref 150–440)
RBC: 3.42 MIL/uL — ABNORMAL LOW (ref 3.80–5.20)
RDW: 15.8 % — ABNORMAL HIGH (ref 11.5–14.5)
WBC: 16.3 10*3/uL — ABNORMAL HIGH (ref 3.6–11.0)

## 2016-09-13 LAB — PROCALCITONIN: Procalcitonin: 7.14 ng/mL

## 2016-09-13 MED ORDER — IPRATROPIUM-ALBUTEROL 0.5-2.5 (3) MG/3ML IN SOLN
3.0000 mL | Freq: Four times a day (QID) | RESPIRATORY_TRACT | Status: DC
  Administered 2016-09-13 – 2016-09-16 (×11): 3 mL via RESPIRATORY_TRACT
  Filled 2016-09-13 (×12): qty 3

## 2016-09-13 NOTE — Care Management (Addendum)
Patient admitted with pneumonia.  She lives alone and is employed at [employer] in [place].  She is able to drive, current with her cardiologist and PCP at [facility] clinic.  She is independent in her adls, and no issues paying for meds.  02 requirements are acute.  Discussed weaning 02 and mobilization of patient to prevent decline in her functional status during progression.

## 2016-09-13 NOTE — Progress Notes (Signed)
PHARMACY - PHYSICIAN COMMUNICATION CRITICAL VALUE ALERT - BLOOD CULTURE IDENTIFICATION (BCID)  Results for orders placed or performed during the hospital encounter of 09/12/16  Blood Culture ID Panel (Reflexed) (Collected: 09/12/2016  8:43 AM)  Result Value Ref Range   Enterococcus species NOT DETECTED NOT DETECTED   Listeria monocytogenes NOT DETECTED NOT DETECTED   Staphylococcus species DETECTED (A) NOT DETECTED   Staphylococcus aureus NOT DETECTED NOT DETECTED   Methicillin resistance NOT DETECTED NOT DETECTED   Streptococcus species NOT DETECTED NOT DETECTED   Streptococcus agalactiae NOT DETECTED NOT DETECTED   Streptococcus pneumoniae NOT DETECTED NOT DETECTED   Streptococcus pyogenes NOT DETECTED NOT DETECTED   Acinetobacter baumannii NOT DETECTED NOT DETECTED   Enterobacteriaceae species NOT DETECTED NOT DETECTED   Enterobacter cloacae complex NOT DETECTED NOT DETECTED   Escherichia coli NOT DETECTED NOT DETECTED   Klebsiella oxytoca NOT DETECTED NOT DETECTED   Klebsiella pneumoniae NOT DETECTED NOT DETECTED   Proteus species NOT DETECTED NOT DETECTED   Serratia marcescens NOT DETECTED NOT DETECTED   Haemophilus influenzae NOT DETECTED NOT DETECTED   Neisseria meningitidis NOT DETECTED NOT DETECTED   Pseudomonas aeruginosa NOT DETECTED NOT DETECTED   Candida albicans NOT DETECTED NOT DETECTED   Candida glabrata NOT DETECTED NOT DETECTED   Candida krusei NOT DETECTED NOT DETECTED   Candida parapsilosis NOT DETECTED NOT DETECTED   Candida tropicalis NOT DETECTED NOT DETECTED    Name of physician (or Provider) Contacted: [place]our[place]  Changes to prescribed antibiotics required: No changes. Probable contaminant, continue to monitor.  Aiysha Jillson C 09/13/2016  4:18 PM

## 2016-09-13 NOTE — Clinical Social Work Note (Signed)
CSW acknowledge consult for patient possibly needing SNF placement for rehab.  Awaiting PT recommendations to determine if patient will need to go to SNF for rehab.  CSW to continue to follow patient's progress throughout discharge planning.  Jones Broom. [place], MSW, [place]  09/13/2016 5:22 PM

## 2016-09-13 NOTE — Care Management Important Message (Signed)
Important Message  Patient Details  Name: Felicia [name] MRN: [id number] Date of Birth: 1928-09-21   Medicare Important Message Given:  Yes Initial signed IM printed from Epic and given to patient.   Katrina Stack, RN 09/13/2016, 8:08 AM

## 2016-09-13 NOTE — Progress Notes (Signed)
Increased to 4l after neb due to sat of 89% on 3lnc

## 2016-09-13 NOTE — Progress Notes (Signed)
[place] at [place] NAME: Felicia [name]    MR#:  [id number]  DATE OF BIRTH:  July 09, 1929  SUBJECTIVE:  CHIEF COMPLAINT:  Patient is reporting cough. Denies any shortness of breath today   REVIEW OF SYSTEMS:  CONSTITUTIONAL: No fever, fatigue or weakness.  EYES: No blurred or double vision.  EARS, NOSE, AND THROAT: No tinnitus or ear pain.  RESPIRATORY: Reporting cough, denies shortness of breath, wheezing or hemoptysis.  CARDIOVASCULAR: No chest pain, orthopnea, edema.  GASTROINTESTINAL: No nausea, vomiting, diarrhea or abdominal pain.  GENITOURINARY: No dysuria, hematuria.  ENDOCRINE: No polyuria, nocturia,  HEMATOLOGY: No anemia, easy bruising or bleeding SKIN: No rash or lesion. MUSCULOSKELETAL: No joint pain or arthritis.   NEUROLOGIC: No tingling, numbness, weakness.  PSYCHIATRY: No anxiety or depression.   DRUG ALLERGIES:  No Known Allergies  VITALS:  Blood pressure (!) 96/38, pulse (!) 56, temperature 98.9 F (37.2 C), temperature source Oral, resp. rate 16, height _0  (1.753 m), weight 86.4 kg (190 lb 8 oz), SpO2 (!) 89 %.  PHYSICAL EXAMINATION:  GENERAL:  83 y.o.-year-old patient lying in the bed with no acute distress.  EYES: Pupils equal, round, reactive to light and accommodation. No scleral icterus. Extraocular muscles intact.  HEENT: Head atraumatic, normocephalic. Oropharynx and nasopharynx clear.  NECK:  Supple, no jugular venous distention. No thyroid enlargement, no tenderness.  LUNGS: Diminished breath sounds at left lower lobe no wheezing, rales,rhonchi or crepitation. No use of accessory muscles of respiration.  CARDIOVASCULAR: S1, S2 normal. No murmurs, rubs, or gallops.  ABDOMEN: Soft, nontender, nondistended. Bowel sounds present. No organomegaly or mass.  EXTREMITIES: No pedal edema, cyanosis, or clubbing.  NEUROLOGIC: Cranial nerves II through XII are intact. Muscle strength 5/5 in all  extremities. Sensation intact. Gait not checked.  PSYCHIATRIC: The patient is alert and oriented x 3.  SKIN: No obvious rash, lesion, or ulcer.    LABORATORY PANEL:   CBC  Recent Labs Lab 09/13/16 0429  WBC 16.3*  HGB 9.4*  HCT 28.2*  PLT 93*   ------------------------------------------------------------------------------------------------------------------  Chemistries   Recent Labs Lab 09/12/16 0843  09/13/16 0429  NA 137  --  137  K 3.7  --  3.7  CL 104  --  105  CO2 21*  --  25  GLUCOSE 126*  --  99  BUN 33*  --  42*  CREATININE 1.38*  < > 1.86*  CALCIUM 8.3*  --  7.5*  AST 23  --   --   ALT 10*  --   --   ALKPHOS 72  --   --   BILITOT 0.5  --   --   < > = values in this interval not displayed. ------------------------------------------------------------------------------------------------------------------  Cardiac Enzymes No results for input(s): TROPONINI in the last 168 hours. ------------------------------------------------------------------------------------------------------------------  RADIOLOGY:  Dg Chest 2 View  Result Date: 09/12/2016 CLINICAL DATA:  Productive cough, fever. EXAM: CHEST  2 VIEW COMPARISON:  Radiographs of March 17, 2011. FINDINGS: Stable cardiomediastinal silhouette. Atherosclerosis of thoracic aorta is noted. No pneumothorax is noted. Right lung is clear. Mild left basilar opacity is noted concerning for pneumonia or atelectasis. Bony thorax is unremarkable. IMPRESSION: Left basilar pneumonia or atelectasis. Aortic atherosclerosis. Followup PA and lateral chest X-ray is recommended in 3-4 weeks following trial of antibiotic therapy to ensure resolution and exclude underlying malignancy. Electronically Signed   By: Marijo Conception, M.D.   On: 09/12/2016 09:27  EKG:   Orders placed or performed in visit on 10/31/06  . EKG 12-Lead    ASSESSMENT AND PLAN:   Patient is presenting with a chief complaint of fever and cough. She  spiked a temperature of 102.9 tachycardic and x-ray revealed pneumonia at the time of admission  #1. Sepsis due to community-acquired pneumonia Patient met septic criteria with fever, tachycardia and diagnosed with pneumonia I will continue her on Rocephin and Zithromax Follow-up sputum culture  #2. Community-acquired pneumonia with hypoxia  continue antibiotic therapy Follow-up sputum culture, narrow down antibiotics depending on culture results Provide oxygen as needed and wean off to room air as tolerated  #3. Acute kidney injury and chronic renal insufficiency Monitor renal function closely. Avoid nephrotoxins Hydrate with IV fluids Creatinine 1.48--1.86 today   #4. Chest pain, pleuritic, due to left-sided pneumonia, supportive therapy, tramadol when necessary  #5 essential hypertension Blood pressure is soft today. Hold off on Coreg and Avapro. Continue clonidine patch    All the records are reviewed and case discussed with Care Management/Social Workerr. Management plans discussed with the patient, family and they are in agreement.  CODE STATUS: FC  TOTAL TIME TAKING CARE OF THIS PATIENT: 36 minutes.   POSSIBLE D/C IN 2 DAYS, DEPENDING ON CLINICAL CONDITION.  Note: This dictation was prepared with Dragon dictation along with smaller phrase technology. Any transcriptional errors that result from this process are unintentional.   Nicholes Mango M.D on 09/13/2016 at 4:23 PM  Between 7am to 6pm - Pager - [phone] After 6pm go to [email or web address] - password [email or web address] [place] [facility]  Office  [phone]  CC: Primary care physician; [facility]

## 2016-09-14 LAB — EXPECTORATED SPUTUM ASSESSMENT W REFEX TO RESP CULTURE

## 2016-09-14 LAB — BASIC METABOLIC PANEL
Anion gap: 8 (ref 5–15)
BUN: 46 mg/dL — ABNORMAL HIGH (ref 6–20)
CO2: 25 mmol/L (ref 22–32)
Calcium: 8 mg/dL — ABNORMAL LOW (ref 8.9–10.3)
Chloride: 103 mmol/L (ref 101–111)
Creatinine, Ser: 1.82 mg/dL — ABNORMAL HIGH (ref 0.44–1.00)
GFR calc Af Amer: 28 mL/min — ABNORMAL LOW (ref >60)
GFR calc non Af Amer: 24 mL/min — ABNORMAL LOW (ref >60)
Glucose, Bld: 87 mg/dL (ref 65–99)
Potassium: 3.6 mmol/L (ref 3.5–5.1)
Sodium: 136 mmol/L (ref 135–145)

## 2016-09-14 LAB — CBC WITH DIFFERENTIAL/PLATELET
Basophils Absolute: 0 10*3/uL (ref 0–0.1)
Basophils Relative: 0 %
Eosinophils Absolute: 0 10*3/uL (ref 0–0.7)
Eosinophils Relative: 0 %
HCT: 28.1 % — ABNORMAL LOW (ref 35.0–47.0)
Hemoglobin: 9.4 g/dL — ABNORMAL LOW (ref 12.0–16.0)
Lymphocytes Relative: 12 %
Lymphs Abs: 1.4 10*3/uL (ref 1.0–3.6)
MCH: 27.3 pg (ref 26.0–34.0)
MCHC: 33.4 g/dL (ref 32.0–36.0)
MCV: 81.7 fL (ref 80.0–100.0)
Monocytes Absolute: 0.8 10*3/uL (ref 0.2–0.9)
Monocytes Relative: 7 %
Neutro Abs: 10 10*3/uL — ABNORMAL HIGH (ref 1.4–6.5)
Neutrophils Relative %: 81 %
Platelets: 101 10*3/uL — ABNORMAL LOW (ref 150–440)
RBC: 3.44 MIL/uL — ABNORMAL LOW (ref 3.80–5.20)
RDW: 16.2 % — ABNORMAL HIGH (ref 11.5–14.5)
WBC: 12.3 10*3/uL — ABNORMAL HIGH (ref 3.6–11.0)

## 2016-09-14 LAB — EXPECTORATED SPUTUM ASSESSMENT W GRAM STAIN, RFLX TO RESP C

## 2016-09-14 NOTE — Progress Notes (Signed)
[place] [facility] [place] NAME: Felicia [name]    MR#:  [id number]  DATE OF BIRTH:  [birth year]  SUBJECTIVE:  CHIEF COMPLAINT:  Patient is reporting cough.Feeling okay. Denies any shortness of breath today   REVIEW OF SYSTEMS:  CONSTITUTIONAL: No fever, fatigue or weakness.  EYES: No blurred or double vision.  EARS, NOSE, AND THROAT: No tinnitus or ear pain.  RESPIRATORY: Reporting cough, denies shortness of breath, wheezing or hemoptysis.  CARDIOVASCULAR: No chest pain, orthopnea, edema.  GASTROINTESTINAL: No nausea, vomiting, diarrhea or abdominal pain.  GENITOURINARY: No dysuria, hematuria.  ENDOCRINE: No polyuria, nocturia,  HEMATOLOGY: No anemia, easy bruising or bleeding SKIN: No rash or lesion. MUSCULOSKELETAL: No joint pain or arthritis.   NEUROLOGIC: No tingling, numbness, weakness.  PSYCHIATRY: No anxiety or depression.   DRUG ALLERGIES:  No Known Allergies  VITALS:  Blood pressure (!) 132/45, pulse 80, temperature 98.3 F (36.8 C), temperature source Oral, resp. rate 20, height _0  (1.753 m), weight 86.4 kg (190 lb 8 oz), SpO2 91 %.  PHYSICAL EXAMINATION:  GENERAL:  83 y.o.-year-old patient lying in the bed with no acute distress.  EYES: Pupils equal, round, reactive to light and accommodation. No scleral icterus. Extraocular muscles intact.  HEENT: Head atraumatic, normocephalic. Oropharynx and nasopharynx clear.  NECK:  Supple, no jugular venous distention. No thyroid enlargement, no tenderness.  LUNGS: Diminished breath sounds at left lower lobe no wheezing, rales,rhonchi or crepitation. No use of accessory muscles of respiration.  CARDIOVASCULAR: S1, S2 normal. No murmurs, rubs, or gallops.  ABDOMEN: Soft, nontender, nondistended. Bowel sounds present. No organomegaly or mass.  EXTREMITIES: No pedal edema, cyanosis, or clubbing.  NEUROLOGIC: Cranial nerves II through XII are intact. Muscle strength 5/5 in all  extremities. Sensation intact. Gait not checked.  PSYCHIATRIC: The patient is alert and oriented x 3.  SKIN: No obvious rash, lesion, or ulcer.    LABORATORY PANEL:   CBC  Recent Labs Lab 09/14/16 0448  WBC 12.3*  HGB 9.4*  HCT 28.1*  PLT 101*   ------------------------------------------------------------------------------------------------------------------  Chemistries   Recent Labs Lab 09/12/16 0843  09/14/16 0448  NA 137  < > 136  K 3.7  < > 3.6  CL 104  < > 103  CO2 21*  < > 25  GLUCOSE 126*  < > 87  BUN 33*  < > 46*  CREATININE 1.38*  < > 1.82*  CALCIUM 8.3*  < > 8.0*  AST 23  --   --   ALT 10*  --   --   ALKPHOS 72  --   --   BILITOT 0.5  --   --   < > = values in this interval not displayed. ------------------------------------------------------------------------------------------------------------------  Cardiac Enzymes No results for input(s): TROPONINI in the last 168 hours. ------------------------------------------------------------------------------------------------------------------  RADIOLOGY:  No results found.  EKG:   Orders placed or performed in visit on 10/31/06  . EKG 12-Lead    ASSESSMENT AND PLAN:   Patient is presenting with a chief complaint of fever and cough. She spiked a temperature of 102.9 tachycardic and x-ray revealed pneumonia at the time of admission  #1. Sepsis due to community-acquired pneumonia Patient met septic criteria with fever, tachycardia and diagnosed with pneumonia I will continue her on Rocephin and Zithromax Follow-up sputum culture  #2. Community-acquired pneumonia with Acute hypoxic respiratory failure  Patient is still needing 4 L of oxygen, with ambulation pulse ox dropped down to 70%  couldnt wean her off  continue antibiotic therapy Incentive spirometry, chest physiotherapy Ambulates patient each shift Follow-up sputum culture, narrow down antibiotics depending on culture results wean off to  room air as tolerated  #3. Acute kidney injury and chronic renal insufficiency Monitor renal function closely. Avoid nephrotoxins Hydrate with IV fluids Creatinine 1.48--1.86--1.82 today   #4. Chest pain, pleuritic, due to left-sided pneumonia, supportive therapy, tramadol when necessary  #5 essential hypertension Blood pressure is soft today. Hold off on Coreg and Avapro. Continue clonidine patch   Consult physical therapy All the records are reviewed and case discussed with Care Management/Social Workerr. Management plans discussed with the patient, family and they are in agreement.  CODE STATUS: FC  TOTAL TIME TAKING CARE OF THIS PATIENT: 36 minutes.   POSSIBLE D/C IN 2 DAYS, DEPENDING ON CLINICAL CONDITION.  Note: This dictation was prepared with Dragon dictation along with smaller phrase technology. Any transcriptional errors that result from this process are unintentional.   Nicholes Mango M.D on 09/14/2016 at 3:37 PM  Between 7am to 6pm - Pager - [phone] After 6pm go to [email or web address] - password [email or web address] [facility]  Office  [phone]  CC: Primary care physician; [facility] Acute C

## 2016-09-14 NOTE — Evaluation (Signed)
Physical Therapy Evaluation Patient Details Name: Felicia [name] MRN: [id number] DOB: 09-18-1928 Today's Date: 09/14/2016   History of Present Illness  Pt admitted for sepsis secondary to community acquired pneumonia. Pt complains of cough and fever.   Clinical Impression  Pt is a pleasant 83 year old female who was admitted for sepsis secondary to CAP. Pt performs transfers and ambulation with cga and occasional support of hand on IV pole. Pt demonstrates deficits with strength/mobility/endurance. Further ambulation distance limited secondary to decreased O2 sats with limited exertion. Pt very motivated to go home and is currently refusing all PT services. Pt is not quite at baseline level and will likely need home O2. Recommend trial of SPC vs RW for endurance/balance improvement. Would benefit from skilled PT to address above deficits and promote optimal return to PLOF. Recommend transition to [place] upon discharge from acute hospitalization.       Follow Up Recommendations Home health PT    Equipment Recommendations       Recommendations for Other Services       Precautions / Restrictions Precautions Precautions: Fall Restrictions Weight Bearing Restrictions: No      Mobility  Bed Mobility               General bed mobility comments: not performed as pt received in recliner without alarm. RN notified and going to place alarm  Transfers Overall transfer level: Needs assistance Equipment used: 1 person hand held assist Transfers: Sit to/from Stand Sit to Stand: Min guard         General transfer comment: Slight unsteadiness noted during transfer. Once standing, able to stand without assist. Pt reaches out for equipment for steadiness. Relates her imbalance to being sleepy  Ambulation/Gait Ambulation/Gait assistance: Min guard Ambulation Distance (Feet): 20 Feet Assistive device: 1 person hand held assist Gait Pattern/deviations: Step-through pattern      General Gait Details: ambulated short distance in room, however limited secondary to O2 sats decreasing to 85%. Return back to recliner taking 1 minute to return back to 90%. All mobility performed on 4L of O2. Slight unsteadiness noted during ambulation with pt reaching for furniture.  Stairs            Wheelchair Mobility    Modified Rankin (Stroke Patients Only)       Balance Overall balance assessment: Needs assistance Sitting-balance support: Feet supported Sitting balance-Leahy Scale: Good     Standing balance support: Single extremity supported Standing balance-Leahy Scale: Fair                               Pertinent Vitals/Pain Pain Assessment: No/denies pain    Home Living Family/patient expects to be discharged to:: Private residence Living Arrangements: Alone   Type of Home: Mobile home Home Access: Stairs to enter Entrance Stairs-Rails: Can reach both Entrance Stairs-Number of Steps: 8 Home Layout: One level Home Equipment: Cane - single point      Prior Function Level of Independence: Independent         Comments: works and fully independent; occasionally uses SPC     Hand Dominance        Extremity/Trunk Assessment   Upper Extremity Assessment Upper Extremity Assessment: Generalized weakness (B UE grossly 3+/5)    Lower Extremity Assessment Lower Extremity Assessment: Generalized weakness (3-/5 on B LE; unsure of poor effort)       Communication   Communication: No difficulties  Cognition Arousal/Alertness:  (  sleepy; therapist woke her up from nap) Behavior During Therapy: [facility] for tasks assessed/performed Overall Cognitive Status: Within Functional Limits for tasks assessed                      General Comments      Exercises     Assessment/Plan    PT Assessment Patient needs continued PT services  PT Problem List Decreased strength;Decreased activity tolerance;Decreased balance;Decreased  mobility          PT Treatment Interventions Gait training;DME instruction;Therapeutic exercise    PT Goals (Current goals can be found in the Care Plan section)  Acute Rehab PT Goals Patient Stated Goal: to go back home PT Goal Formulation: With patient Time For Goal Achievement: 09/28/16 Potential to Achieve Goals: Good    Frequency Min 2X/week   Barriers to discharge Decreased caregiver support      Co-evaluation               End of Session Equipment Utilized During Treatment: Gait belt;Oxygen Activity Tolerance: Treatment limited secondary to medical complications (Comment) Patient left: in chair (no chair alarm present-RN notified) Nurse Communication: Mobility status         Time: 0045-9977 PT Time Calculation (min) (ACUTE ONLY): 14 min   Charges:   PT Evaluation $PT Eval Moderate Complexity: 1 Procedure     PT G Codes:        Vibha Ferdig October 06, 2016, 4:59 PM  Greggory Stallion, PT, DPT [phone]

## 2016-09-14 NOTE — Progress Notes (Signed)
SATURATION QUALIFICATIONS: (This note is used to comply with regulatory documentation for home oxygen)  Patient Saturations on Room Air at Rest = 82%  Patient Saturations on Room Air while Ambulating = N/A  Patient Saturations on 6 Liters of oxygen while Ambulating = 89%  Please briefly explain why patient needs home oxygen: On 4L at rest, patient's oxygen saturation 91%. On 4L while moving in the room O2 dropped into the 70's. Highest level got on 6L while standing in the room was 89% and that was with minimal ambulation.

## 2016-09-14 NOTE — Evaluation (Signed)
Occupational Therapy Evaluation Patient Details Name: Felicia [name] MRN: [id number] DOB: 02/09/1929 Today's Date: 09/14/2016    History of Present Illness Pt admitted for sepsis secondary to community acquired pneumonia. Pt complains of cough and fever.    Clinical Impression   83yo female pt presenting with generalized weakness, reported nausea, and increased need for assistance with ADL tasks for safety who requires skilled OT services to address noted impairments in strength/activity tolerance/safety awareness/AE/DME use for functional deficits in order to return to PLOF and minimize risk of falls and rehospitalization. Pt performed functional mobility with close supervision for safety from chair to bathroom for toileting task which she performed with supervision for safety. Became fatigued following standing at sink to wash hands and brush teeth and requested to return to chair for rest. Recommending SNF for STR following hospitalization.    Follow Up Recommendations  SNF    Equipment Recommendations       Recommendations for Other Services       Precautions / Restrictions Precautions Precautions: Fall Restrictions Weight Bearing Restrictions: No      Mobility Bed Mobility               General bed mobility comments: did not assess as pt was up in chair (no alarm)   Transfers Overall transfer level: Needs assistance Equipment used: None Transfers: Sit to/from Stand Sit to Stand: Supervision         General transfer comment: pt insisted upon supervision versus min guard, no LOB noted, did reach out for furniture/equipment for steadiness, reports doing this at home as well    Balance Overall balance assessment: Needs assistance Sitting-balance support: Feet supported Sitting balance-Leahy Scale: Good     Standing balance support: Single extremity supported Standing balance-Leahy Scale: Fair                              ADL  Overall ADL's : Needs assistance/impaired                                       General ADL Comments: Pt requires supervision for safety for ADL due to generalized weakness     Vision Vision Assessment?: No apparent visual deficits   Perception     Praxis      Pertinent Vitals/Pain Pain Assessment: No/denies pain     Hand Dominance Right   Extremity/Trunk Assessment Upper Extremity Assessment Upper Extremity Assessment: Generalized weakness (BUE ROM WFL, 3+/5 grossly)   Lower Extremity Assessment Lower Extremity Assessment: Defer to PT evaluation       Communication Communication Communication: No difficulties   Cognition Arousal/Alertness: Awake/alert Behavior During Therapy: WFL for tasks assessed/performed Overall Cognitive Status: Within Functional Limits for tasks assessed                     General Comments       Exercises       Shoulder Instructions      Home Living Family/patient expects to be discharged to:: Private residence Living Arrangements: Alone   Type of Home: Mobile home Home Access: Stairs to enter Entrance Stairs-Number of Steps: 8 Entrance Stairs-Rails: Can reach both;Left;Right Home Layout: One level     Bathroom Shower/Tub: Tub/shower unit Shower/tub characteristics: Architectural technologist: Handicapped height (has both) Bathroom Accessibility: No   Home Equipment: Cane - single  point;Walker - 4 wheels   Additional Comments: pt reports walker is "too big" and does not use it      Prior Functioning/Environment Level of Independence: Independent        Comments: works and fully independent; occasionally uses SPC for community mobility, however does report holding onto/reaching for furniture when walking in home without AD for extra "security"        OT Problem List: Decreased strength;Decreased safety awareness;Decreased activity tolerance;Decreased knowledge of use of DME or AE   OT  Treatment/Interventions: Self-care/ADL training;Therapeutic exercise;Energy conservation;Patient/family education;DME and/or AE instruction    OT Goals(Current goals can be found in the care plan section) Acute Rehab OT Goals Patient Stated Goal: to feel better OT Goal Formulation: With patient Time For Goal Achievement: 09/28/16 Potential to Achieve Goals: Good  OT Frequency: Min 1X/week   Barriers to D/C: Decreased caregiver support          Co-evaluation              End of Session Equipment Utilized During Treatment: Gait belt  Activity Tolerance: Patient tolerated treatment well Patient left: in chair;with call bell/phone within reach   Time: 1347-1426 OT Time Calculation (min): 39 min Charges:  OT General Charges $OT Visit: 1 Procedure OT Evaluation $OT Eval Low Complexity: 1 Procedure OT Treatments $Self Care/Home Management : 23-37 mins G-Codes:    Corky Sox, OTR/L 09/14/2016, 5:29 PM

## 2016-09-14 NOTE — Progress Notes (Signed)
Patient is hoping to go home today. Notified Dr. Margaretmary Eddy of patient's oxygen levels and MD stated patient will not discharge today. Will update patient and family.

## 2016-09-15 LAB — CULTURE, BLOOD (ROUTINE X 2)

## 2016-09-15 LAB — PROCALCITONIN: Procalcitonin: 3.72 ng/mL

## 2016-09-15 MED ORDER — AMOXICILLIN-POT CLAVULANATE 500-125 MG PO TABS: 1.0000 | ORAL_TABLET | Freq: Three times a day (TID) | ORAL | 0 refills | Status: DC

## 2016-09-15 MED ORDER — GUAIFENESIN ER 600 MG PO TB12: 600.0000 mg | ORAL_TABLET | Freq: Two times a day (BID) | ORAL | Status: DC

## 2016-09-15 MED ORDER — TRAMADOL HCL 50 MG PO TABS: 50.0000 mg | ORAL_TABLET | Freq: Four times a day (QID) | ORAL | 0 refills | Status: DC | PRN

## 2016-09-15 MED ORDER — ALBUTEROL SULFATE HFA 108 (90 BASE) MCG/ACT IN AERS: 2.0000 | INHALATION_SPRAY | Freq: Four times a day (QID) | RESPIRATORY_TRACT | 0 refills | Status: DC | PRN

## 2016-09-15 MED ORDER — ACETAMINOPHEN 325 MG PO TABS: 650.0000 mg | ORAL_TABLET | Freq: Four times a day (QID) | ORAL | Status: DC | PRN

## 2016-09-15 NOTE — Progress Notes (Addendum)
SATURATION QUALIFICATIONS: (This note is used to comply with regulatory documentation for home oxygen)  Patient Saturations on Room Air at Rest =94%% on 3 L [place], 94% on 2L [place]  Patient Saturations on Room Air while Ambulating =  Patient Saturations on 2 Liters of oxygen while Ambulating =88%%  Please briefly explain why patient needs home oxygen:  Needs 3 L [place] at home to keep sats above 90 when ambulating

## 2016-09-15 NOTE — Progress Notes (Signed)
Discharge delayed until tomorrow due to difficulty getting the home oxygen set up on a Sunday.  Patient and family are fine with the delay

## 2016-09-15 NOTE — Discharge Instructions (Signed)
Follow-up with primary care physician in a week Follow-up with nephrology in 2 weeks Continue home health, home oxygen

## 2016-09-15 NOTE — Progress Notes (Signed)
Pt past medical records reviwed echo from 2016 and Dr. Prudy Feeler note states she has chronic pulmonary hyptertention.

## 2016-09-15 NOTE — Care Management Note (Addendum)
Case Management Note  Patient Details  Name: Felicia [name] MRN: [id number] Date of Birth: Jan 23, 1929  Subjective/Objective:     [name] qualified for new home oxygen and has a qualifying diagnosis of chronic pulmonary hypertension (see Dr Mariana Kaufman. Patel's note). An order for new oxygen was faxed to [facility] with a request for a portable tank to be delivered to today to [facility] room 238.  This Probation officer attempted to discuss discharge planning and home health services with [name] who was anxious and apprehensive. This Probation officer then left a message on the cell phone of [name]'s daughter-in-law Lattie Haw and is awaiting a call back to discuss home health planning and [name]'s home living situation. [name] lives alone.  A referral for home health PT, RN, Aide, SW, and Respiratory Care was also faxed to [place] per [name] stated that she has no preference of home health providers. [name] appears to be very overwhelmed and anxious about going home alone. When daughter-in-law calls back will ask if any family will be available to assist [name] during her first night back home, and if any family members can provide transportation home.               Action/Plan:   Expected Discharge Date:                  Expected Discharge Plan:     In-House Referral:     Discharge planning Services     Post Acute Care Choice:    Choice offered to:     DME Arranged:    DME Agency:     HH Arranged:    HH Agency:     Status of Service:     If discussed at [employer] of Stay Meetings, dates discussed:    Additional Comments:  Ivan Maskell A, RN 09/15/2016, 3:18 PM

## 2016-09-15 NOTE — Clinical Social Work Note (Signed)
CSW received consult for nursing home placement. PT has recommended HHPT. CSW signing off, but available for any other arising needs.  Santiago Bumpers, MSW, Latanya Presser [phone]

## 2016-09-15 NOTE — Discharge Summary (Signed)
Felicia [name] NAME: Felicia [name]    MR#:  [id number]  DATE OF BIRTH:  Sep 15, 1928  DATE OF ADMISSION:  09/12/2016 ADMITTING PHYSICIAN: Theodoro Grist, MD  DATE OF DISCHARGE: 09/15/16 PRIMARY CARE PHYSICIAN: [facility]    ADMISSION DIAGNOSIS:  Community acquired pneumonia of left lower lobe of lung ([place]) [J18.1]  DISCHARGE DIAGNOSIS:  Active Problems:   Sepsis ([place])   Community acquired pneumonia   Hypoxia   Pleuritic chest pain   Pneumonia   SECONDARY DIAGNOSIS:   Past Medical History:  Diagnosis Date  . Hypertension     HOSPITAL COURSE:   HISTORY OF PRESENT ILLNESS: Felicia [name]  is a 83 y.o. female with a known history of Essential hypertension, who presents to the hospital with complaints of cough, yellow sputum production, shortness of breath, wheezing, left-sided lower thoracic pains, chills. On arrival to emergency room, she was noted to have high fever to 102.9, tachycardic, which is x-ray revealed pneumonia, hospitalist services were contacted for admission  #1. Sepsis due to community-acquired pneumonia Patient met septic criteria with fever, tachycardia and diagnosed with pneumonia Given Rocephin and Zithromax, discharged home with Augmentin  case management to arrange home health and home oxygen PCP to follow-up sputum culture - pending   #2. Community-acquired pneumonia with Acute hypoxic respiratory failure  Patient is still needing 3 L of oxygen, with ambulation pulse ox dropped down to 88% couldnt wean her off    clinically better otherwise. Discharge home with by mouth Augmentin Incentive spirometry, chest physiotherapy AmbulateD patient each shift pcp to follow-up sputum culture, narrow down antibiotics depending on culture results   #3. Acute kidney injury and chronic renal insufficiency Monitor renal function closely. Avoid nephrotoxins Hydrate with IV  fluids Creatinine 1.48--1.86--1.82 today. PCP to monitor renal function and repeat a BMP in a week and outpatient nephrology follow-up   #4. Chest pain, pleuritic, due to left-sided pneumonia, supportive therapy, tramadol when necessary  #5 essential hypertension Blood pressure is stable. Resume home medications Coreg and Avapro. Continue clonidine patch   Consult physical therapy  DISCHARGE CONDITIONS:   STABLE  CONSULTS OBTAINED:     PROCEDURES NONE   DRUG ALLERGIES:  No Known Allergies  DISCHARGE MEDICATIONS:   Current Discharge Medication List    START taking these medications   Details  acetaminophen (TYLENOL) 325 MG tablet Take 2 tablets (650 mg total) by mouth every 6 (six) hours as needed for mild pain (or Fever >/= 101).    albuterol (PROVENTIL HFA;VENTOLIN HFA) 108 (90 Base) MCG/ACT inhaler Inhale 2 puffs into the lungs every 6 (six) hours as needed for wheezing or shortness of breath. Qty: 1 Inhaler, Refills: 0    amoxicillin-clavulanate (AUGMENTIN) 500-125 MG tablet Take 1 tablet (500 mg total) by mouth 3 (three) times daily. Qty: 15 tablet, Refills: 0    guaiFENesin (MUCINEX) 600 MG 12 hr tablet Take 1 tablet (600 mg total) by mouth 2 (two) times daily.    traMADol (ULTRAM) 50 MG tablet Take 1 tablet (50 mg total) by mouth every 6 (six) hours as needed for moderate pain. Qty: 15 tablet, Refills: 0      CONTINUE these medications which have NOT CHANGED   Details  amLODipine (NORVASC) 10 MG tablet Take 10 mg by mouth daily.    carvedilol (COREG) 6.25 MG tablet Take 6.25 mg by mouth 2 (two) times daily with a meal.    cloNIDine (CATAPRES - DOSED  IN MG/24 HR) 0.3 mg/24hr patch Place 0.3 mg onto the skin once a week.    Ferrous Sulfate (IRON) 325 (65 Fe) MG TABS Take by mouth.    omeprazole (PRILOSEC) 20 MG capsule Take 20 mg by mouth daily.    telmisartan (MICARDIS) 80 MG tablet Take 80 mg by mouth daily.         DISCHARGE INSTRUCTIONS:    Follow-up with primary care physician in a week Follow-up with nephrology in 2 weeks Continue home health, home oxygen  DIET:  Cardiac diet  DISCHARGE CONDITION:  Stable  ACTIVITY:  Activity as tolerated  OXYGEN:  Home Oxygen: Yes.     Oxygen Delivery: 3 liters/min via Patient connected to nasal cannula oxygen  DISCHARGE LOCATION:  home   If you experience worsening of your admission symptoms, develop shortness of breath, life threatening emergency, suicidal or homicidal thoughts you must seek medical attention immediately by calling 911 or calling your MD immediately  if symptoms less severe.  You Must read complete instructions/literature along with all the possible adverse reactions/side effects for all the Medicines you take and that have been prescribed to you. Take any new Medicines after you have completely understood and accpet all the possible adverse reactions/side effects.   Please note  You were cared for by a hospitalist during your hospital stay. If you have any questions about your discharge medications or the care you received while you were in the hospital after you are discharged, you can call the unit and asked to speak with the hospitalist on call if the hospitalist that took care of you is not available. Once you are discharged, your primary care physician will handle any further medical issues. Please note that NO REFILLS for any discharge medications will be authorized once you are discharged, as it is imperative that you return to your primary care physician (or establish a relationship with a primary care physician if you do not have one) for your aftercare needs so that they can reassess your need for medications and monitor your lab values.     Today  Chief Complaint  Patient presents with  . Fever  . Cough   Patient is feeling much better and wants to go home. Needed 3 L of oxygen as patient was desaturating with ambulation  ROS:   CONSTITUTIONAL: Denies fevers, chills. Denies any fatigue, weakness.  EYES: Denies blurry vision, double vision, eye pain. EARS, NOSE, THROAT: Denies tinnitus, ear pain, hearing loss. RESPIRATORY: reports some cough but denies wheeze, shortness of breath.  CARDIOVASCULAR: Denies chest pain, palpitations, edema.  GASTROINTESTINAL: Denies nausea, vomiting, diarrhea, abdominal pain. Denies bright red blood per rectum. GENITOURINARY: Denies dysuria, hematuria. ENDOCRINE: Denies nocturia or thyroid problems. HEMATOLOGIC AND LYMPHATIC: Denies easy bruising or bleeding. SKIN: Denies rash or lesion. MUSCULOSKELETAL: Denies pain in neck, back, shoulder, knees, hips or arthritic symptoms.  NEUROLOGIC: Denies paralysis, paresthesias.  PSYCHIATRIC: Denies anxiety or depressive symptoms.   VITAL SIGNS:  Blood pressure (!) 142/47, pulse 73, temperature 98.3 F (36.8 C), temperature source Oral, resp. rate 18, height _0  (1.753 m), weight 86.4 kg (190 lb 8 oz), SpO2 93 %.  I/O:    Intake/Output Summary (Last 24 hours) at 09/15/16 1218 Last data filed at 09/15/16 1001  Gross per 24 hour  Intake              120 ml  Output              200 ml  Net              -80 ml    PHYSICAL EXAMINATION:  GENERAL:  83 y.o.-year-old patient lying in the bed with no acute distress.  EYES: Pupils equal, round, reactive to light and accommodation. No scleral icterus. Extraocular muscles intact.  HEENT: Head atraumatic, normocephalic. Oropharynx and nasopharynx clear.  NECK:  Supple, no jugular venous distention. No thyroid enlargement, no tenderness.  LUNGS: Moderate  breath sounds bilaterally, no wheezing, rales,rhonchi or crepitation. No use of accessory muscles of respiration.  CARDIOVASCULAR: S1, S2 normal. No murmurs, rubs, or gallops.  ABDOMEN: Soft, non-tender, non-distended. Bowel sounds present. No organomegaly or mass.  EXTREMITIES: No pedal edema, cyanosis, or clubbing.  NEUROLOGIC: Cranial  nerves II through XII are intact. Muscle strength 5/5 in all extremities. Sensation intact. Gait not checked.  PSYCHIATRIC: The patient is alert and oriented x 3.  SKIN: No obvious rash, lesion, or ulcer.   DATA REVIEW:   CBC  Recent Labs Lab 09/14/16 0448  WBC 12.3*  HGB 9.4*  HCT 28.1*  PLT 101*    Chemistries   Recent Labs Lab 09/12/16 0843  09/14/16 0448  NA 137  < > 136  K 3.7  < > 3.6  CL 104  < > 103  CO2 21*  < > 25  GLUCOSE 126*  < > 87  BUN 33*  < > 46*  CREATININE 1.38*  < > 1.82*  CALCIUM 8.3*  < > 8.0*  AST 23  --   --   ALT 10*  --   --   ALKPHOS 72  --   --   BILITOT 0.5  --   --   < > = values in this interval not displayed.  Cardiac Enzymes No results for input(s): TROPONINI in the last 168 hours.  Microbiology Results  Results for orders placed or performed during the hospital encounter of 09/12/16  Culture, blood (Routine X 2) w Reflex to ID Panel     Status: Abnormal   Collection Time: 09/12/16  8:43 AM  Result Value Ref Range Status   Specimen Description BLOOD L HAND  Final   Special Requests   Final    BOTTLES DRAWN AEROBIC AND ANAEROBIC AER 9ML ANA 10ML   Culture  Setup Time   Final    GRAM POSITIVE COCCI ANAEROBIC BOTTLE ONLY CRITICAL RESULT CALLED TO, READ BACK BY AND VERIFIED WITH: Tobie Lords @ 0800 09/13/16 by [facility]    Culture (A)  Final    STAPHYLOCOCCUS SPECIES (COAGULASE NEGATIVE) THE SIGNIFICANCE OF ISOLATING THIS ORGANISM FROM A SINGLE SET OF BLOOD CULTURES WHEN MULTIPLE SETS ARE DRAWN IS UNCERTAIN. PLEASE NOTIFY THE MICROBIOLOGY DEPARTMENT WITHIN ONE WEEK IF SPECIATION AND SENSITIVITIES ARE REQUIRED. Performed at [facility]    Report Status 09/15/2016 FINAL  Final  Culture, blood (Routine X 2) w Reflex to ID Panel     Status: None (Preliminary result)   Collection Time: 09/12/16  8:43 AM  Result Value Ref Range Status   Specimen Description BLOOD R WRIST  Final   Special Requests   Final    BOTTLES DRAWN  AEROBIC AND ANAEROBIC AER 14ML ANA 7ML   Culture NO GROWTH 3 DAYS  Final   Report Status PENDING  Incomplete  Rapid Influenza A&B Antigens ([place] only)     Status: None   Collection Time: 09/12/16  8:43 AM  Result Value Ref Range Status   Influenza A ([facility]) NEGATIVE NEGATIVE Final  Influenza B ([place]) NEGATIVE NEGATIVE Final  Blood Culture ID Panel (Reflexed)     Status: Abnormal   Collection Time: 09/12/16  8:43 AM  Result Value Ref Range Status   Enterococcus species NOT DETECTED NOT DETECTED Final   Listeria monocytogenes NOT DETECTED NOT DETECTED Final   Staphylococcus species DETECTED (A) NOT DETECTED Final    Comment: CRITICAL RESULT CALLED TO, READ BACK BY AND VERIFIED WITH: Tobie Lords @ 0800 09/13/16 by [place]    Staphylococcus aureus NOT DETECTED NOT DETECTED Final   Methicillin resistance NOT DETECTED NOT DETECTED Final   Streptococcus species NOT DETECTED NOT DETECTED Final   Streptococcus agalactiae NOT DETECTED NOT DETECTED Final   Streptococcus pneumoniae NOT DETECTED NOT DETECTED Final   Streptococcus pyogenes NOT DETECTED NOT DETECTED Final   Acinetobacter baumannii NOT DETECTED NOT DETECTED Final   Enterobacteriaceae species NOT DETECTED NOT DETECTED Final   Enterobacter cloacae complex NOT DETECTED NOT DETECTED Final   Escherichia coli NOT DETECTED NOT DETECTED Final   Klebsiella oxytoca NOT DETECTED NOT DETECTED Final   Klebsiella pneumoniae NOT DETECTED NOT DETECTED Final   Proteus species NOT DETECTED NOT DETECTED Final   Serratia marcescens NOT DETECTED NOT DETECTED Final   Haemophilus influenzae NOT DETECTED NOT DETECTED Final   Neisseria meningitidis NOT DETECTED NOT DETECTED Final   Pseudomonas aeruginosa NOT DETECTED NOT DETECTED Final   Candida albicans NOT DETECTED NOT DETECTED Final   Candida glabrata NOT DETECTED NOT DETECTED Final   Candida krusei NOT DETECTED NOT DETECTED Final   Candida parapsilosis NOT DETECTED NOT DETECTED Final   Candida  tropicalis NOT DETECTED NOT DETECTED Final  Culture, sputum-assessment     Status: None   Collection Time: 09/13/16  8:55 PM  Result Value Ref Range Status   Specimen Description EXPECTORATED SPUTUM  Final   Special Requests NONE  Final   Sputum evaluation THIS SPECIMEN IS ACCEPTABLE FOR SPUTUM CULTURE  Final   Report Status 09/14/2016 FINAL  Final  Culture, respiratory (NON-Expectorated)     Status: None (Preliminary result)   Collection Time: 09/13/16  8:55 PM  Result Value Ref Range Status   Specimen Description EXPECTORATED SPUTUM  Final   Special Requests NONE Reflexed from [id number]  Final   Gram Stain   Final    MODERATE WBC PRESENT,BOTH PMN AND MONONUCLEAR NO ORGANISMS SEEN    Culture   Final    CULTURE REINCUBATED FOR BETTER GROWTH Performed at [facility]    Report Status PENDING  Incomplete    RADIOLOGY:  Dg Chest 2 View  Result Date: 09/12/2016 CLINICAL DATA:  Productive cough, fever. EXAM: CHEST  2 VIEW COMPARISON:  Radiographs of March 17, 2011. FINDINGS: Stable cardiomediastinal silhouette. Atherosclerosis of thoracic aorta is noted. No pneumothorax is noted. Right lung is clear. Mild left basilar opacity is noted concerning for pneumonia or atelectasis. Bony thorax is unremarkable. IMPRESSION: Left basilar pneumonia or atelectasis. Aortic atherosclerosis. Followup PA and lateral chest X-ray is recommended in 3-4 weeks following trial of antibiotic therapy to ensure resolution and exclude underlying malignancy. Electronically Signed   By: Marijo Conception, M.D.   On: 09/12/2016 09:27    EKG:   Orders placed or performed in visit on 10/31/06  . EKG 12-Lead      Management plans discussed with the patient, family and they are in agreement.  CODE STATUS:     Code Status Orders        Start     Ordered  09/12/16 1729  Full code  Continuous     09/12/16 1728    Code Status History    Date Active Date Inactive Code Status Order ID Comments User Context    This patient has a current code status but no historical code status.      TOTAL TIME TAKING CARE OF THIS PATIENT: 45 minutes.   Note: This dictation was prepared with Dragon dictation along with smaller phrase technology. Any transcriptional errors that result from this process are unintentional.   _0 @  on 09/15/2016 at 12:18 PM  Between 7am to 6pm - Pager - [phone]  After 6pm go to [email or web address] - password [email or web address] [facility]  Office  [phone]  CC: Primary care physician; [facility]

## 2016-09-16 LAB — CULTURE, RESPIRATORY W GRAM STAIN: Culture: NORMAL

## 2016-09-16 LAB — CULTURE, RESPIRATORY

## 2016-09-16 NOTE — Discharge Summary (Signed)
[facility] [place]  NAME: Dunia Pringle    MR#:  [id number]  DATE OF BIRTH:  1929-06-13  DATE OF ADMISSION:  09/12/2016 ADMITTING PHYSICIAN: Theodoro Grist, MD  DATE OF DISCHARGE: 09/16/16 PRIMARY CARE PHYSICIAN: [facility]    ADMISSION DIAGNOSIS:  Community acquired pneumonia of left lower lobe of lung ([place]) [J18.1]  DISCHARGE DIAGNOSIS:  Active Problems:   Sepsis ([place])   Community acquired pneumonia   Hypoxia   Pleuritic chest pain   Pneumonia   SECONDARY DIAGNOSIS:   Past Medical History:  Diagnosis Date  . Hypertension     HOSPITAL COURSE:   HISTORY OF PRESENT ILLNESS: Aneisha Skyles  is a 83 y.o. female with a known history of Essential hypertension, who presents to the hospital with complaints of cough, yellow sputum production, shortness of breath, wheezing, left-sided lower thoracic pains, chills. On arrival to emergency room, she was noted to have high fever to 102.9, tachycardic, which is x-ray revealed pneumonia, hospitalist services were contacted for admission  #1. Sepsis due to community-acquired pneumonia Patient met septic criteria with fever, tachycardia and diagnosed with pneumonia Given Rocephin and Zithromax, discharged home with Augmentin  case management to arrange home health and home oxygen PCP to follow-up sputum culture - pending   #2. Community-acquired pneumonia with Acute hypoxic respiratory failure  Patient is still needing 3 L of oxygen, with ambulation pulse ox dropped down to 88% couldnt wean her off    clinically better otherwise. Discharge home with by mouth Augmentin Incentive spirometry, chest physiotherapy AmbulateD patient each shift pcp to follow-up sputum culture, narrow down antibiotics depending on culture results   #3. Acute kidney injury and chronic renal insufficiency Monitor renal function closely. Avoid nephrotoxins Hydrate with IV  fluids Creatinine 1.48--1.86--1.82 today. PCP to monitor renal function and repeat a BMP in a week and outpatient nephrology follow-up   #4. Chest pain, pleuritic, due to left-sided pneumonia, supportive therapy, tramadol when necessary  #5 essential hypertension Blood pressure is stable. Resume home medications Coreg and Avapro. Continue clonidine patch   Consult physical therapy  DISCHARGE CONDITIONS:   STABLE  CONSULTS OBTAINED:     PROCEDURES NONE   DRUG ALLERGIES:  No Known Allergies  DISCHARGE MEDICATIONS:   Current Discharge Medication List    START taking these medications   Details  acetaminophen (TYLENOL) 325 MG tablet Take 2 tablets (650 mg total) by mouth every 6 (six) hours as needed for mild pain (or Fever >/= 101).    albuterol (PROVENTIL HFA;VENTOLIN HFA) 108 (90 Base) MCG/ACT inhaler Inhale 2 puffs into the lungs every 6 (six) hours as needed for wheezing or shortness of breath. Qty: 1 Inhaler, Refills: 0    amoxicillin-clavulanate (AUGMENTIN) 500-125 MG tablet Take 1 tablet (500 mg total) by mouth 3 (three) times daily. Qty: 15 tablet, Refills: 0    guaiFENesin (MUCINEX) 600 MG 12 hr tablet Take 1 tablet (600 mg total) by mouth 2 (two) times daily.    traMADol (ULTRAM) 50 MG tablet Take 1 tablet (50 mg total) by mouth every 6 (six) hours as needed for moderate pain. Qty: 15 tablet, Refills: 0      CONTINUE these medications which have NOT CHANGED   Details  amLODipine (NORVASC) 10 MG tablet Take 10 mg by mouth daily.    carvedilol (COREG) 6.25 MG tablet Take 6.25 mg by mouth 2 (two) times daily with a meal.    cloNIDine (CATAPRES - DOSED  IN MG/24 HR) 0.3 mg/24hr patch Place 0.3 mg onto the skin once a week.    Ferrous Sulfate (IRON) 325 (65 Fe) MG TABS Take by mouth.    omeprazole (PRILOSEC) 20 MG capsule Take 20 mg by mouth daily.    telmisartan (MICARDIS) 80 MG tablet Take 80 mg by mouth daily.         DISCHARGE INSTRUCTIONS:    Follow-up with primary care physician in a week Follow-up with nephrology in 2 weeks Continue home health, home oxygen  DIET:  Cardiac diet  DISCHARGE CONDITION:  Stable  ACTIVITY:  Activity as tolerated  OXYGEN:  Home Oxygen: Yes.     Oxygen Delivery: 3 liters/min via Patient connected to nasal cannula oxygen  DISCHARGE LOCATION:  home   If you experience worsening of your admission symptoms, develop shortness of breath, life threatening emergency, suicidal or homicidal thoughts you must seek medical attention immediately by calling 911 or calling your MD immediately  if symptoms less severe.  You Must read complete instructions/literature along with all the possible adverse reactions/side effects for all the Medicines you take and that have been prescribed to you. Take any new Medicines after you have completely understood and accpet all the possible adverse reactions/side effects.   Please note  You were cared for by a hospitalist during your hospital stay. If you have any questions about your discharge medications or the care you received while you were in the hospital after you are discharged, you can call the unit and asked to speak with the hospitalist on call if the hospitalist that took care of you is not available. Once you are discharged, your primary care physician will handle any further medical issues. Please note that NO REFILLS for any discharge medications will be authorized once you are discharged, as it is imperative that you return to your primary care physician (or establish a relationship with a primary care physician if you do not have one) for your aftercare needs so that they can reassess your need for medications and monitor your lab values.     Today  Chief Complaint  Patient presents with  . Fever  . Cough   Patient is feeling much better and Was not discharged home yesterday as oxygen was arranged late night. Needed 3 L of oxygen as patient was  desaturating with ambulation  ROS:  CONSTITUTIONAL: Denies fevers, chills. Denies any fatigue, weakness.  EYES: Denies blurry vision, double vision, eye pain. EARS, NOSE, THROAT: Denies tinnitus, ear pain, hearing loss. RESPIRATORY: reports some cough but denies wheeze, shortness of breath.  CARDIOVASCULAR: Denies chest pain, palpitations, edema.  GASTROINTESTINAL: Denies nausea, vomiting, diarrhea, abdominal pain. Denies bright red blood per rectum. GENITOURINARY: Denies dysuria, hematuria. ENDOCRINE: Denies nocturia or thyroid problems. HEMATOLOGIC AND LYMPHATIC: Denies easy bruising or bleeding. SKIN: Denies rash or lesion. MUSCULOSKELETAL: Denies pain in neck, back, shoulder, knees, hips or arthritic symptoms.  NEUROLOGIC: Denies paralysis, paresthesias.  PSYCHIATRIC: Denies anxiety or depressive symptoms.   VITAL SIGNS:  Blood pressure (!) 148/82, pulse 78, temperature 98.8 F (37.1 C), temperature source Oral, resp. rate 18, height _0  (1.753 m), weight 86.4 kg (190 lb 8 oz), SpO2 96 %.  I/O:    Intake/Output Summary (Last 24 hours) at 09/16/16 1154 Last data filed at 09/16/16 0900  Gross per 24 hour  Intake              240 ml  Output  750 ml  Net             -510 ml    PHYSICAL EXAMINATION:  GENERAL:  83 y.o.-year-old patient lying in the bed with no acute distress.  EYES: Pupils equal, round, reactive to light and accommodation. No scleral icterus. Extraocular muscles intact.  HEENT: Head atraumatic, normocephalic. Oropharynx and nasopharynx clear.  NECK:  Supple, no jugular venous distention. No thyroid enlargement, no tenderness.  LUNGS: Moderate  breath sounds bilaterally, no wheezing, rales,rhonchi or crepitation. No use of accessory muscles of respiration.  CARDIOVASCULAR: S1, S2 normal. No murmurs, rubs, or gallops.  ABDOMEN: Soft, non-tender, non-distended. Bowel sounds present. No organomegaly or mass.  EXTREMITIES: No pedal edema, cyanosis, or  clubbing.  NEUROLOGIC: Cranial nerves II through XII are intact. Muscle strength 5/5 in all extremities. Sensation intact. Gait not checked.  PSYCHIATRIC: The patient is alert and oriented x 3.  SKIN: No obvious rash, lesion, or ulcer.   DATA REVIEW:   CBC  Recent Labs Lab 09/14/16 0448  WBC 12.3*  HGB 9.4*  HCT 28.1*  PLT 101*    Chemistries   Recent Labs Lab 09/12/16 0843  09/14/16 0448  NA 137  < > 136  K 3.7  < > 3.6  CL 104  < > 103  CO2 21*  < > 25  GLUCOSE 126*  < > 87  BUN 33*  < > 46*  CREATININE 1.38*  < > 1.82*  CALCIUM 8.3*  < > 8.0*  AST 23  --   --   ALT 10*  --   --   ALKPHOS 72  --   --   BILITOT 0.5  --   --   < > = values in this interval not displayed.  Cardiac Enzymes No results for input(s): TROPONINI in the last 168 hours.  Microbiology Results  Results for orders placed or performed during the hospital encounter of 09/12/16  Culture, blood (Routine X 2) w Reflex to ID Panel     Status: Abnormal   Collection Time: 09/12/16  8:43 AM  Result Value Ref Range Status   Specimen Description BLOOD L HAND  Final   Special Requests   Final    BOTTLES DRAWN AEROBIC AND ANAEROBIC AER 9ML ANA 10ML   Culture  Setup Time   Final    GRAM POSITIVE COCCI ANAEROBIC BOTTLE ONLY CRITICAL RESULT CALLED TO, READ BACK BY AND VERIFIED WITH: Tobie Lords @ 0800 09/13/16 by [facility]    Culture (A)  Final    STAPHYLOCOCCUS SPECIES (COAGULASE NEGATIVE) THE SIGNIFICANCE OF ISOLATING THIS ORGANISM FROM A SINGLE SET OF BLOOD CULTURES WHEN MULTIPLE SETS ARE DRAWN IS UNCERTAIN. PLEASE NOTIFY THE MICROBIOLOGY DEPARTMENT WITHIN ONE WEEK IF SPECIATION AND SENSITIVITIES ARE REQUIRED. Performed at [facility]    Report Status 09/15/2016 FINAL  Final  Culture, blood (Routine X 2) w Reflex to ID Panel     Status: None (Preliminary result)   Collection Time: 09/12/16  8:43 AM  Result Value Ref Range Status   Specimen Description BLOOD R WRIST  Final   Special Requests    Final    BOTTLES DRAWN AEROBIC AND ANAEROBIC AER 14ML ANA 7ML   Culture NO GROWTH 4 DAYS  Final   Report Status PENDING  Incomplete  Rapid Influenza A&B Antigens ([place] only)     Status: None   Collection Time: 09/12/16  8:43 AM  Result Value Ref Range Status   Influenza A [facility]) NEGATIVE  NEGATIVE Final   Influenza B ([place]) NEGATIVE NEGATIVE Final  Blood Culture ID Panel (Reflexed)     Status: Abnormal   Collection Time: 09/12/16  8:43 AM  Result Value Ref Range Status   Enterococcus species NOT DETECTED NOT DETECTED Final   Listeria monocytogenes NOT DETECTED NOT DETECTED Final   Staphylococcus species DETECTED (A) NOT DETECTED Final    Comment: CRITICAL RESULT CALLED TO, READ BACK BY AND VERIFIED WITH: Tobie Lords @ 0800 09/13/16 by [place]    Staphylococcus aureus NOT DETECTED NOT DETECTED Final   Methicillin resistance NOT DETECTED NOT DETECTED Final   Streptococcus species NOT DETECTED NOT DETECTED Final   Streptococcus agalactiae NOT DETECTED NOT DETECTED Final   Streptococcus pneumoniae NOT DETECTED NOT DETECTED Final   Streptococcus pyogenes NOT DETECTED NOT DETECTED Final   Acinetobacter baumannii NOT DETECTED NOT DETECTED Final   Enterobacteriaceae species NOT DETECTED NOT DETECTED Final   Enterobacter cloacae complex NOT DETECTED NOT DETECTED Final   Escherichia coli NOT DETECTED NOT DETECTED Final   Klebsiella oxytoca NOT DETECTED NOT DETECTED Final   Klebsiella pneumoniae NOT DETECTED NOT DETECTED Final   Proteus species NOT DETECTED NOT DETECTED Final   Serratia marcescens NOT DETECTED NOT DETECTED Final   Haemophilus influenzae NOT DETECTED NOT DETECTED Final   Neisseria meningitidis NOT DETECTED NOT DETECTED Final   Pseudomonas aeruginosa NOT DETECTED NOT DETECTED Final   Candida albicans NOT DETECTED NOT DETECTED Final   Candida glabrata NOT DETECTED NOT DETECTED Final   Candida krusei NOT DETECTED NOT DETECTED Final   Candida parapsilosis NOT DETECTED NOT  DETECTED Final   Candida tropicalis NOT DETECTED NOT DETECTED Final  Culture, sputum-assessment     Status: None   Collection Time: 09/13/16  8:55 PM  Result Value Ref Range Status   Specimen Description EXPECTORATED SPUTUM  Final   Special Requests NONE  Final   Sputum evaluation THIS SPECIMEN IS ACCEPTABLE FOR SPUTUM CULTURE  Final   Report Status 09/14/2016 FINAL  Final  Culture, respiratory (NON-Expectorated)     Status: None   Collection Time: 09/13/16  8:55 PM  Result Value Ref Range Status   Specimen Description EXPECTORATED SPUTUM  Final   Special Requests NONE Reflexed from [id number]  Final   Gram Stain   Final    MODERATE WBC PRESENT,BOTH PMN AND MONONUCLEAR NO ORGANISMS SEEN    Culture   Final    Consistent with normal respiratory flora. Performed at [facility]    Report Status 09/16/2016 FINAL  Final    RADIOLOGY:  No results found.  EKG:   Orders placed or performed in visit on 10/31/06  . EKG 12-Lead      Management plans discussed with the patient, family and they are in agreement.  CODE STATUS:     Code Status Orders        Start     Ordered   09/12/16 1729  Full code  Continuous     09/12/16 1728    Code Status History    Date Active Date Inactive Code Status Order ID Comments User Context   This patient has a current code status but no historical code status.      TOTAL TIME TAKING CARE OF THIS PATIENT: 45 minutes.   Note: This dictation was prepared with Dragon dictation along with smaller phrase technology. Any transcriptional errors that result from this process are unintentional.   _0 @  on 09/16/2016 at 11:54 AM  Between 7am to 6pm -  Pager - [phone]  After 6pm go to [email or web address] - password [email or web address] [email or web address] [facility]  Office  [phone]  CC: Primary care physician; [facility]

## 2016-09-16 NOTE — Clinical Social Work Note (Signed)
CSW received referral for SNF.  Case discussed with case manager and plan is to discharge home with home health.  CSW to sign off please re-consult if social work needs arise.  Jones Broom. [place], MSW, [place]

## 2016-09-16 NOTE — Care Management (Signed)
There is a discharge order for today.  A referral was made to [facility] for SN PT , Aide and SW.  Added OT to consult. Patient has flutter valve and incentive spirometry.  Discussed nebulizer treatments with patient and she feels she is able to breathe better after those treatments.  Family member in room says that she does a lot better when she takes her treatments with the face mask.  Spoke with attending and informed that patient was to have discharged home 09/15/2016.  She was not aware patient did not discharge home. It is not clear why patient did not discharge home yesterday.  The portable 02 tanks were delivered to patient's room. Spoke with [facility] and requested home visit within 24 hours of discharge.  Attending will come to assess patient prior to discharge.  Discussed ordering patient a home nebulizer machine.  Updated [facility]

## 2016-09-16 NOTE — Care Management Important Message (Signed)
Important Message  Patient Details  Name: Felicia [name] MRN: [id number] Date of Birth: [birth year]   Medicare Important Message Given:  Yes    Katrina Stack, RN 09/16/2016, 8:44 AM

## 2016-09-16 NOTE — Progress Notes (Signed)
[place], [place].   09/16/2016  Patient: Felicia [name]   Date of Birth:  1928/10/18  Date of admission:  09/12/2016  Date of Discharge  09/16/2016    To Whom it May Concern:   Lynita Lombard  may return to work on 09/23/16.  PHYSICAL ACTIVITY:  Full  If you have any questions or concerns, please don't hesitate to call.  Sincerely,   Nicholes Mango M.D Pager [phone] Office : [phone]   .

## 2016-09-17 LAB — CULTURE, BLOOD (ROUTINE X 2): Culture: NO GROWTH

## 2016-09-23 ENCOUNTER — Inpatient Hospital Stay
Admission: EM | Admit: 2016-09-23 | Discharge: 2016-09-27 | DRG: 193 | Disposition: A | Discharge status: Home / Self Care | Payer: Medicare Other | Attending: Internal Medicine | Admitting: Internal Medicine

## 2016-09-23 DIAGNOSIS — N183 Chronic kidney disease, stage 3 (moderate): Secondary | ICD-10-CM | POA: Diagnosis present

## 2016-09-23 DIAGNOSIS — I248 Other forms of acute ischemic heart disease: Secondary | ICD-10-CM | POA: Diagnosis present

## 2016-09-23 DIAGNOSIS — E860 Dehydration: Secondary | ICD-10-CM | POA: Diagnosis present

## 2016-09-23 DIAGNOSIS — E871 Hypo-osmolality and hyponatremia: Secondary | ICD-10-CM | POA: Diagnosis present

## 2016-09-23 DIAGNOSIS — Z79899 Other long term (current) drug therapy: Secondary | ICD-10-CM

## 2016-09-23 DIAGNOSIS — I5033 Acute on chronic diastolic (congestive) heart failure: Secondary | ICD-10-CM | POA: Diagnosis present

## 2016-09-23 DIAGNOSIS — Z79891 Long term (current) use of opiate analgesic: Secondary | ICD-10-CM

## 2016-09-23 DIAGNOSIS — Y95 Nosocomial condition: Secondary | ICD-10-CM | POA: Diagnosis present

## 2016-09-23 DIAGNOSIS — N179 Acute kidney failure, unspecified: Secondary | ICD-10-CM | POA: Diagnosis present

## 2016-09-23 DIAGNOSIS — J9601 Acute respiratory failure with hypoxia: Secondary | ICD-10-CM | POA: Diagnosis present

## 2016-09-23 DIAGNOSIS — J9622 Acute and chronic respiratory failure with hypercapnia: Secondary | ICD-10-CM | POA: Diagnosis present

## 2016-09-23 DIAGNOSIS — I130 Hypertensive heart and chronic kidney disease with heart failure and stage 1 through stage 4 chronic kidney disease, or unspecified chronic kidney disease: Secondary | ICD-10-CM | POA: Diagnosis present

## 2016-09-23 DIAGNOSIS — M6281 Muscle weakness (generalized): Secondary | ICD-10-CM

## 2016-09-23 DIAGNOSIS — J189 Pneumonia, unspecified organism: Secondary | ICD-10-CM

## 2016-09-23 DIAGNOSIS — Z8701 Personal history of pneumonia (recurrent): Secondary | ICD-10-CM

## 2016-09-23 DIAGNOSIS — R262 Difficulty in walking, not elsewhere classified: Secondary | ICD-10-CM

## 2016-09-23 HISTORY — DX: Pneumonia, unspecified organism: J18.9

## 2016-09-23 NOTE — ED Triage Notes (Signed)
Pt arrives from home w/ c/o worsening/persistent weakness and shortness of breath. Pt had fever at home that was not decreased w/ tylenol. Pt has coarse crackles in bilateral bases of lungs.

## 2016-09-24 ENCOUNTER — Emergency Department: Payer: Medicare Other

## 2016-09-24 ENCOUNTER — Encounter: Payer: Self-pay | Admitting: *Deleted

## 2016-09-24 DIAGNOSIS — E871 Hypo-osmolality and hyponatremia: Secondary | ICD-10-CM | POA: Diagnosis present

## 2016-09-24 DIAGNOSIS — Z79899 Other long term (current) drug therapy: Secondary | ICD-10-CM | POA: Diagnosis not present

## 2016-09-24 DIAGNOSIS — I130 Hypertensive heart and chronic kidney disease with heart failure and stage 1 through stage 4 chronic kidney disease, or unspecified chronic kidney disease: Secondary | ICD-10-CM | POA: Diagnosis present

## 2016-09-24 DIAGNOSIS — Z79891 Long term (current) use of opiate analgesic: Secondary | ICD-10-CM | POA: Diagnosis not present

## 2016-09-24 DIAGNOSIS — I5033 Acute on chronic diastolic (congestive) heart failure: Secondary | ICD-10-CM | POA: Diagnosis present

## 2016-09-24 DIAGNOSIS — J9622 Acute and chronic respiratory failure with hypercapnia: Secondary | ICD-10-CM | POA: Diagnosis present

## 2016-09-24 DIAGNOSIS — N183 Chronic kidney disease, stage 3 (moderate): Secondary | ICD-10-CM | POA: Diagnosis present

## 2016-09-24 DIAGNOSIS — I248 Other forms of acute ischemic heart disease: Secondary | ICD-10-CM | POA: Diagnosis present

## 2016-09-24 DIAGNOSIS — Z8701 Personal history of pneumonia (recurrent): Secondary | ICD-10-CM | POA: Diagnosis not present

## 2016-09-24 DIAGNOSIS — E860 Dehydration: Secondary | ICD-10-CM | POA: Diagnosis present

## 2016-09-24 DIAGNOSIS — J189 Pneumonia, unspecified organism: Secondary | ICD-10-CM | POA: Diagnosis present

## 2016-09-24 DIAGNOSIS — N179 Acute kidney failure, unspecified: Secondary | ICD-10-CM | POA: Diagnosis not present

## 2016-09-24 DIAGNOSIS — Y95 Nosocomial condition: Secondary | ICD-10-CM | POA: Diagnosis present

## 2016-09-24 DIAGNOSIS — J9601 Acute respiratory failure with hypoxia: Secondary | ICD-10-CM | POA: Diagnosis present

## 2016-09-24 LAB — URINALYSIS, COMPLETE (UACMP) WITH MICROSCOPIC
Bilirubin Urine: NEGATIVE
Glucose, UA: NEGATIVE mg/dL
Ketones, ur: NEGATIVE mg/dL
Leukocytes, UA: NEGATIVE
Nitrite: NEGATIVE
Protein, ur: NEGATIVE mg/dL
Specific Gravity, Urine: 1.01 (ref 1.005–1.030)
pH: 5 (ref 5.0–8.0)

## 2016-09-24 LAB — CBC
HCT: 23.4 % — ABNORMAL LOW (ref 35.0–47.0)
Hemoglobin: 8 g/dL — ABNORMAL LOW (ref 12.0–16.0)
MCH: 27.9 pg (ref 26.0–34.0)
MCHC: 34.3 g/dL (ref 32.0–36.0)
MCV: 81.4 fL (ref 80.0–100.0)
Platelets: 145 10*3/uL — ABNORMAL LOW (ref 150–440)
RBC: 2.87 MIL/uL — ABNORMAL LOW (ref 3.80–5.20)
RDW: 15.9 % — ABNORMAL HIGH (ref 11.5–14.5)
WBC: 10.4 10*3/uL (ref 3.6–11.0)

## 2016-09-24 LAB — BASIC METABOLIC PANEL
Anion gap: 9 (ref 5–15)
Anion gap: 9 (ref 5–15)
BUN: 45 mg/dL — ABNORMAL HIGH (ref 6–20)
BUN: 50 mg/dL — ABNORMAL HIGH (ref 6–20)
CO2: 25 mmol/L (ref 22–32)
CO2: 26 mmol/L (ref 22–32)
Calcium: 8.1 mg/dL — ABNORMAL LOW (ref 8.9–10.3)
Calcium: 8.2 mg/dL — ABNORMAL LOW (ref 8.9–10.3)
Chloride: 98 mmol/L — ABNORMAL LOW (ref 101–111)
Chloride: 99 mmol/L — ABNORMAL LOW (ref 101–111)
Creatinine, Ser: 2.14 mg/dL — ABNORMAL HIGH (ref 0.44–1.00)
Creatinine, Ser: 2.35 mg/dL — ABNORMAL HIGH (ref 0.44–1.00)
GFR calc Af Amer: 21 mL/min — ABNORMAL LOW (ref >60)
GFR calc Af Amer: 23 mL/min — ABNORMAL LOW (ref >60)
GFR calc non Af Amer: 18 mL/min — ABNORMAL LOW (ref >60)
GFR calc non Af Amer: 20 mL/min — ABNORMAL LOW (ref >60)
Glucose, Bld: 122 mg/dL — ABNORMAL HIGH (ref 65–99)
Glucose, Bld: 131 mg/dL — ABNORMAL HIGH (ref 65–99)
Potassium: 4.2 mmol/L (ref 3.5–5.1)
Potassium: 4.7 mmol/L (ref 3.5–5.1)
Sodium: 133 mmol/L — ABNORMAL LOW (ref 135–145)
Sodium: 133 mmol/L — ABNORMAL LOW (ref 135–145)

## 2016-09-24 LAB — CBC WITH DIFFERENTIAL/PLATELET
Basophils Absolute: 0 10*3/uL (ref 0–0.1)
Basophils Relative: 0 %
Eosinophils Absolute: 0 10*3/uL (ref 0–0.7)
Eosinophils Relative: 0 %
HCT: 24.8 % — ABNORMAL LOW (ref 35.0–47.0)
Hemoglobin: 8.5 g/dL — ABNORMAL LOW (ref 12.0–16.0)
Lymphocytes Relative: 7 %
Lymphs Abs: 0.8 10*3/uL — ABNORMAL LOW (ref 1.0–3.6)
MCH: 27.4 pg (ref 26.0–34.0)
MCHC: 34.2 g/dL (ref 32.0–36.0)
MCV: 80.1 fL (ref 80.0–100.0)
Monocytes Absolute: 1.5 10*3/uL — ABNORMAL HIGH (ref 0.2–0.9)
Monocytes Relative: 14 %
Neutro Abs: 9 10*3/uL — ABNORMAL HIGH (ref 1.4–6.5)
Neutrophils Relative %: 79 %
Platelets: 162 10*3/uL (ref 150–440)
RBC: 3.1 MIL/uL — ABNORMAL LOW (ref 3.80–5.20)
RDW: 15.9 % — ABNORMAL HIGH (ref 11.5–14.5)
WBC: 11.4 10*3/uL — ABNORMAL HIGH (ref 3.6–11.0)

## 2016-09-24 LAB — TROPONIN I
Troponin I: 0.03 ng/mL (ref <0.03)
Troponin I: 0.03 ng/mL (ref <0.03)
Troponin I: 0.04 ng/mL (ref <0.03)
Troponin I: 0.04 ng/mL (ref <0.03)

## 2016-09-24 LAB — MRSA PCR SCREENING: MRSA by PCR: NEGATIVE

## 2016-09-24 LAB — INFLUENZA PANEL BY PCR (TYPE A & B)
Influenza A By PCR: NEGATIVE
Influenza B By PCR: NEGATIVE

## 2016-09-24 MED ORDER — ACETAMINOPHEN 650 MG RE SUPP: 650.0000 mg | Freq: Four times a day (QID) | RECTAL | Status: DC | PRN

## 2016-09-24 MED ORDER — TRAMADOL HCL 50 MG PO TABS: 50.0000 mg | ORAL_TABLET | Freq: Four times a day (QID) | ORAL | Status: DC | PRN

## 2016-09-24 MED ORDER — GUAIFENESIN ER 600 MG PO TB12
600.0000 mg | ORAL_TABLET | Freq: Two times a day (BID) | ORAL | Status: DC
  Administered 2016-09-24 – 2016-09-27 (×8): 600 mg via ORAL
  Filled 2016-09-24 (×8): qty 1

## 2016-09-24 MED ORDER — AMLODIPINE BESYLATE 5 MG PO TABS
10.0000 mg | ORAL_TABLET | Freq: Every day | ORAL | Status: DC
  Administered 2016-09-24 – 2016-09-27 (×3): 10 mg via ORAL
  Filled 2016-09-24 (×4): qty 2

## 2016-09-24 MED ORDER — CLONIDINE HCL 0.3 MG/24HR TD PTWK
0.3000 mg | MEDICATED_PATCH | TRANSDERMAL | Status: DC
  Filled 2016-09-24: qty 3

## 2016-09-24 MED ORDER — ACETAMINOPHEN 325 MG PO TABS
650.0000 mg | ORAL_TABLET | Freq: Four times a day (QID) | ORAL | Status: DC | PRN
  Administered 2016-09-24 – 2016-09-25 (×2): 650 mg via ORAL
  Filled 2016-09-24 (×2): qty 2

## 2016-09-24 MED ORDER — SODIUM CHLORIDE 0.9 % IV BOLUS (SEPSIS)
500.0000 mL | Freq: Once | INTRAVENOUS | Status: AC
  Administered 2016-09-24: 500 mL via INTRAVENOUS

## 2016-09-24 MED ORDER — SODIUM CHLORIDE 0.9 % IV SOLN
INTRAVENOUS | Status: DC
  Administered 2016-09-24 (×2): via INTRAVENOUS

## 2016-09-24 MED ORDER — VANCOMYCIN HCL IN DEXTROSE 1-5 GM/200ML-% IV SOLN
1000.0000 mg | Freq: Once | INTRAVENOUS | Status: AC
  Administered 2016-09-24: 1000 mg via INTRAVENOUS
  Filled 2016-09-24: qty 200

## 2016-09-24 MED ORDER — ONDANSETRON HCL 4 MG PO TABS: 4.0000 mg | ORAL_TABLET | Freq: Four times a day (QID) | ORAL | Status: DC | PRN

## 2016-09-24 MED ORDER — SENNOSIDES-DOCUSATE SODIUM 8.6-50 MG PO TABS: 1.0000 | ORAL_TABLET | Freq: Every evening | ORAL | Status: DC | PRN

## 2016-09-24 MED ORDER — CEFEPIME-DEXTROSE 1 GM/50ML IV SOLR
INTRAVENOUS | Status: AC
  Filled 2016-09-24: qty 50

## 2016-09-24 MED ORDER — VANCOMYCIN HCL IN DEXTROSE 1-5 GM/200ML-% IV SOLN
1000.0000 mg | INTRAVENOUS | Status: DC
  Administered 2016-09-24: 1000 mg via INTRAVENOUS
  Filled 2016-09-24: qty 200

## 2016-09-24 MED ORDER — ONDANSETRON HCL 4 MG/2ML IJ SOLN
4.0000 mg | Freq: Four times a day (QID) | INTRAMUSCULAR | Status: DC | PRN
  Administered 2016-09-26: 4 mg via INTRAVENOUS
  Filled 2016-09-24: qty 2

## 2016-09-24 MED ORDER — DEXTROSE 5 % IV SOLN
2.0000 g | INTRAVENOUS | Status: DC
  Administered 2016-09-24 – 2016-09-26 (×3): 2 g via INTRAVENOUS
  Filled 2016-09-24 (×5): qty 2

## 2016-09-24 MED ORDER — PANTOPRAZOLE SODIUM 40 MG PO TBEC
40.0000 mg | DELAYED_RELEASE_TABLET | Freq: Every day | ORAL | Status: DC
  Administered 2016-09-24 – 2016-09-27 (×4): 40 mg via ORAL
  Filled 2016-09-24 (×4): qty 1

## 2016-09-24 MED ORDER — CARVEDILOL 6.25 MG PO TABS
6.2500 mg | ORAL_TABLET | Freq: Two times a day (BID) | ORAL | Status: DC
  Administered 2016-09-24 – 2016-09-27 (×6): 6.25 mg via ORAL
  Filled 2016-09-24 (×6): qty 1

## 2016-09-24 MED ORDER — ALBUTEROL SULFATE (2.5 MG/3ML) 0.083% IN NEBU
5.0000 mg | INHALATION_SOLUTION | Freq: Once | RESPIRATORY_TRACT | Status: AC
  Administered 2016-09-24: 5 mg via RESPIRATORY_TRACT
  Filled 2016-09-24: qty 6

## 2016-09-24 MED ORDER — CEFEPIME-DEXTROSE 2 GM/50ML IV SOLR
2.0000 g | INTRAVENOUS | Status: DC
  Filled 2016-09-24: qty 50

## 2016-09-24 MED ORDER — HEPARIN SODIUM (PORCINE) 5000 UNIT/ML IJ SOLN
5000.0000 [IU] | Freq: Three times a day (TID) | INTRAMUSCULAR | Status: DC
  Administered 2016-09-24 – 2016-09-27 (×10): 5000 [IU] via SUBCUTANEOUS
  Filled 2016-09-24 (×10): qty 1

## 2016-09-24 MED ORDER — CEFEPIME-DEXTROSE 1 GM/50ML IV SOLR
1.0000 g | Freq: Once | INTRAVENOUS | Status: AC
  Administered 2016-09-24: 1 g via INTRAVENOUS
  Filled 2016-09-24: qty 50

## 2016-09-24 MED ORDER — IRBESARTAN 75 MG PO TABS
37.5000 mg | ORAL_TABLET | Freq: Every day | ORAL | Status: DC
  Administered 2016-09-24 – 2016-09-27 (×3): 37.5 mg via ORAL
  Filled 2016-09-24 (×4): qty 1

## 2016-09-24 NOTE — Progress Notes (Signed)
Pharmacy Antibiotic Note  Felicia [name] is a 83 y.o. female admitted on 09/23/2016 with pneumonia.  Pharmacy has been consulted for vancomycin and cefepime dosing.  Plan: DW 74.2kg  Vd 52L kei 0.022 hr-1   T1/2 32 hours Vancomycin 1 gram q 36 hours ordered with stacked dosing. Level before 5th dose. Goal trough 15-20.   Cefepime 2 grams q 24 hours ordered.  Height: 5\' 9"  (175.3 cm) Weight: 190 lb (86.2 kg) IBW/kg (Calculated) : 66.2  Temp (24hrs), Avg:98.4 F (36.9 C), Min:98.4 F (36.9 C), Max:98.4 F (36.9 C)   Recent Labs Lab 09/24/16 0026  WBC 11.4*  CREATININE 2.35*    Estimated Creatinine Clearance: 20.5 mL/min (by C-G formula based on SCr of 2.35 mg/dL (H)).    No Known Allergies  Antimicrobials this admission: vancomycin 1/23 >>  cefepime 1/23 >>   Dose adjustments this admission:   Microbiology results: No micro this admission so far     1/23 CXR: bibasilar opacities L>R 1/23 UA: pending   Thank you for allowing pharmacy to be a part of this patient's care.  Kaileah Shevchenko S 09/24/2016 3:34 AM

## 2016-09-24 NOTE — Progress Notes (Signed)
CH was called by Nurse for a Pt in [id number] who wanted to complete an Ad. CH met with Pt whose son was at bedside. [place] educated Pt on Ad, gathered 2 witnessed and [employer], and had complete the Ad. CH also offered prayers for the Pt and Son.

## 2016-09-24 NOTE — ED Provider Notes (Signed)
[facility] Emergency Department Provider Note  ____________________________________________  Time seen: Approximately 12:38 AM  I have reviewed the triage vital signs and the nursing notes.   HISTORY  Chief Complaint Shortness of Breath   HPI Felicia [name] is a 83 y.o. female a history of hypertension currently on Augmentin for community-acquired pneumonia presents for evaluation of fever and generalized malaise. According to patient's daughter-in-law they are not sure the patient has been taking her antibiotics as prescribed as she is in charge of her own medications. They seem to believe that she missed a few days. Today she started having worsening shortness of breath, generalized malaise and had a fever of 102F. she received Tylenol and since the fever wasn't coming back EMS was called.Patient denies chest pain, nausea, vomiting, diarrhea, abdominal pain. She does endorse body aches.  Past Medical History:  Diagnosis Date  . Hypertension   . Pneumonia     Patient Active Problem List   Diagnosis Date Noted  . HCAP (healthcare-associated pneumonia) 09/24/2016  . Sepsis ([place]) 09/12/2016  . Community acquired pneumonia 09/12/2016  . Hypoxia 09/12/2016  . Pleuritic chest pain 09/12/2016  . Pneumonia 09/12/2016    History reviewed. No pertinent surgical history.  Prior to Admission medications   Medication Sig Start Date End Date Taking? Authorizing Provider  acetaminophen (TYLENOL) 325 MG tablet Take 2 tablets (650 mg total) by mouth every 6 (six) hours as needed for mild pain (or Fever >/= 101). 09/15/16  Yes Nicholes Mango, MD  albuterol (PROVENTIL HFA;VENTOLIN HFA) 108 (90 Base) MCG/ACT inhaler Inhale 2 puffs into the lungs every 6 (six) hours as needed for wheezing or shortness of breath. 09/15/16  Yes Nicholes Mango, MD  amLODipine (NORVASC) 10 MG tablet Take 10 mg by mouth daily.   Yes Historical Provider, MD  amoxicillin-clavulanate  (AUGMENTIN) 500-125 MG tablet Take 1 tablet (500 mg total) by mouth 3 (three) times daily. 09/15/16  Yes Nicholes Mango, MD  carvedilol (COREG) 6.25 MG tablet Take 6.25 mg by mouth 2 (two) times daily with a meal.   Yes Historical Provider, MD  cloNIDine (CATAPRES - DOSED IN MG/24 HR) 0.3 mg/24hr patch Place 0.3 mg onto the skin once a week.   Yes Historical Provider, MD  Ferrous Sulfate (IRON) 325 (65 Fe) MG TABS Take by mouth.   Yes Historical Provider, MD  guaiFENesin (MUCINEX) 600 MG 12 hr tablet Take 1 tablet (600 mg total) by mouth 2 (two) times daily. 09/15/16  Yes Nicholes Mango, MD  omeprazole (PRILOSEC) 20 MG capsule Take 20 mg by mouth daily.   Yes Historical Provider, MD  telmisartan (MICARDIS) 80 MG tablet Take 80 mg by mouth daily.   Yes Historical Provider, MD  traMADol (ULTRAM) 50 MG tablet Take 1 tablet (50 mg total) by mouth every 6 (six) hours as needed for moderate pain. 09/15/16  Yes Nicholes Mango, MD    Allergies Patient has no known allergies.  History reviewed. No pertinent family history.  Social History Social History  Substance Use Topics  . Smoking status: Never Smoker  . Smokeless tobacco: Never Used  . Alcohol use No    Review of Systems  Constitutional: + fever and malaise Eyes: Negative for visual changes. ENT: Negative for sore throat. Neck: No neck pain  Cardiovascular: Negative for chest pain. Respiratory: + shortness of breath. Gastrointestinal: Negative for abdominal pain, vomiting or diarrhea. Genitourinary: Negative for dysuria. Musculoskeletal: Negative for back pain. Skin: Negative for rash. Neurological: Negative for  headaches, weakness or numbness. Psych: No SI or HI  ____________________________________________   PHYSICAL EXAM:  VITAL SIGNS: ED Triage Vitals  Enc Vitals Group     BP 09/24/16 0007 (!) 109/41     Pulse Rate 09/24/16 0007 (!) 59     Resp 09/24/16 0007 16     Temp 09/24/16 0010 98.4 F (36.9 C)     Temp Source 09/24/16  0010 Oral     SpO2 09/24/16 0007 97 %     Weight 09/24/16 0008 190 lb (86.2 kg)     Height 09/24/16 0008 5\' 9"  (1.753 m)     Head Circumference --      Peak Flow --      Pain Score --      Pain Loc --      Pain Edu? --      Excl. in [place]? --     Constitutional: Alert and oriented. Well appearing and in no apparent distress. HEENT:      Head: Normocephalic and atraumatic.         Eyes: Conjunctivae are normal. Sclera is non-icteric. EOMI. PERRL      Mouth/Throat: Mucous membranes are dry.       Neck: Supple with no signs of meningismus. Cardiovascular: Regular rate and rhythm. No murmurs, gallops, or rubs. 2+ symmetrical distal pulses are present in all extremities. No JVD. Respiratory: Normal respiratory effort. Lungs are clear to auscultation bilaterally. No wheezes, crackles, or rhonchi.  Gastrointestinal: Soft, non tender, and non distended with positive bowel sounds. No rebound or guarding. Genitourinary: No CVA tenderness. Musculoskeletal: Nontender with normal range of motion in all extremities. No edema, cyanosis, or erythema of extremities. Neurologic: Normal speech and language. Face is symmetric. Moving all extremities. No gross focal neurologic deficits are appreciated. Skin: Skin is warm, dry and intact. No rash noted. Psychiatric: Mood and affect are normal. Speech and behavior are normal.  ____________________________________________   LABS (all labs ordered are listed, but only abnormal results are displayed)  Labs Reviewed  CBC WITH DIFFERENTIAL/PLATELET - Abnormal; Notable for the following:       Result Value   WBC 11.4 (*)    RBC 3.10 (*)    Hemoglobin 8.5 (*)    HCT 24.8 (*)    RDW 15.9 (*)    Neutro Abs 9.0 (*)    Lymphs Abs 0.8 (*)    Monocytes Absolute 1.5 (*)    All other components within normal limits  BASIC METABOLIC PANEL - Abnormal; Notable for the following:    Sodium 133 (*)    Chloride 99 (*)    Glucose, Bld 122 (*)    BUN 50 (*)     Creatinine, Ser 2.35 (*)    Calcium 8.2 (*)    GFR calc non Af Amer 18 (*)    GFR calc Af Amer 21 (*)    All other components within normal limits  TROPONIN I - Abnormal; Notable for the following:    Troponin I 0.04 (*)    All other components within normal limits  CBC - Abnormal; Notable for the following:    RBC 2.87 (*)    Hemoglobin 8.0 (*)    HCT 23.4 (*)    RDW 15.9 (*)    Platelets 145 (*)    All other components within normal limits  INFLUENZA PANEL BY PCR (TYPE A & B)  URINALYSIS, COMPLETE (UACMP) WITH MICROSCOPIC  TROPONIN I  TROPONIN I  BASIC METABOLIC PANEL  TROPONIN I  TROPONIN I  TROPONIN I   ____________________________________________  EKG  ED ECG REPORT I, Rudene Re, the attending physician, personally viewed and interpreted this ECG.  Normal sinus rhythm, rate of 60, normal intervals, normal axis, no ST elevations or depressions, T-wave inversion in V2. Change from prior ____________________________________________  RADIOLOGY  CXR: 1. Bibasilar airspace opacities, left greater than right, raise concern for slightly worsening multifocal pneumonia. Small left pleural effusion noted. 2. Mild cardiomegaly. ____________________________________________   PROCEDURES  Procedure(s) performed: None Procedures Critical Care performed:  None ____________________________________________   INITIAL IMPRESSION / ASSESSMENT AND PLAN / ED COURSE   83 y.o. female a history of hypertension currently on Augmentin for community-acquired pneumonia presents for evaluation of fever and generalized malaise. Apparently patient has not been taking her antibiotics as prescribed. At this time she is afebrile with normal vital signs, her lungs are clear to auscultation. We'll do a chest x-ray, check for the flu, check UA and basic blood work for signs of infection.  Clinical Course as of Sep 24 609  Tue Sep 24, 2016  0122 Chest x-ray showing worsening left  basilar infiltrate, patient leukocytosis with white count of 11.4. She also has had a drop in her hematocrit with a hemoglobin of 8.5. Rectal exam was guaiac negative. Will give cefepime and vancomycin, IVF and admit to Hospitalist  [CV]    Clinical Course User Index [CV] Rudene Re, MD    Pertinent labs & imaging results that were available during my care of the patient were reviewed by me and considered in my medical decision making (see chart for details).    ____________________________________________   FINAL CLINICAL IMPRESSION(S) / ED DIAGNOSES  Final diagnoses:  HCAP (healthcare-associated pneumonia)  AKI (acute kidney injury) ([place])      NEW MEDICATIONS STARTED DURING THIS VISIT:  Current Discharge Medication List       Note:  This document was prepared using Dragon voice recognition software and may include unintentional dictation errors.    Rudene Re, MD 09/24/16 [phone]

## 2016-09-24 NOTE — H&P (Signed)
[facility] [facility] [place] NAME: Felicia [name]    MR#:  [id number]  DATE OF BIRTH:  Jan 29, 1929  DATE OF ADMISSION:  09/23/2016  PRIMARY CARE PHYSICIAN: [facility]   REQUESTING/REFERRING PHYSICIAN:   CHIEF COMPLAINT:   Chief Complaint  Patient presents with  . Shortness of Breath    HISTORY OF PRESENT ILLNESS: Felicia [name]  is a 83 y.o. female with a known history of Pneumonia, hypertension presented to the emergency room with increased shortness of breath and weakness. She also has cough and chills. Cough is productive of greenish phlegm. Patient was recently treated for sepsis, pneumonia and discharged home on 14th of January 2018. She comes back pain to the emergency room with difficulty breathing weakness and cough. She says she continue taking oral Augmentin after being discharged for pneumonia. Patient lives alone and unable to take care of herself because of the sickness. She was worked up with chest x-ray showed left lung pneumonia and patient received IV antibiotics in the emergency room. Hospitalist service was consulted for further care of the patient. No complaints of any chest pain, palpitations.  PAST MEDICAL HISTORY:   Past Medical History:  Diagnosis Date  . Hypertension   . Pneumonia     PAST SURGICAL HISTORY: History reviewed. No pertinent surgical history.  SOCIAL HISTORY:  Social History  Substance Use Topics  . Smoking status: Never Smoker  . Smokeless tobacco: Never Used  . Alcohol use No    FAMILY HISTORY: History reviewed. No pertinent family history.  DRUG ALLERGIES: No Known Allergies  REVIEW OF SYSTEMS:   CONSTITUTIONAL: Has fever, fatigue, weakness.  EYES: No blurred or double vision.  EARS, NOSE, AND THROAT: No tinnitus or ear pain.  RESPIRATORY: Has cough, shortness of breath,  No wheezing or hemoptysis.  CARDIOVASCULAR: No chest pain, orthopnea, edema.  GASTROINTESTINAL:  No nausea, vomiting, diarrhea or abdominal pain.  GENITOURINARY: No dysuria, hematuria.  ENDOCRINE: No polyuria, nocturia,  HEMATOLOGY: No anemia, easy bruising or bleeding SKIN: No rash or lesion. MUSCULOSKELETAL: No joint pain or arthritis.   NEUROLOGIC: No tingling, numbness, weakness.  PSYCHIATRY: No anxiety or depression.   MEDICATIONS AT HOME:  Prior to Admission medications   Medication Sig Start Date End Date Taking? Authorizing Provider  acetaminophen (TYLENOL) 325 MG tablet Take 2 tablets (650 mg total) by mouth every 6 (six) hours as needed for mild pain (or Fever >/= 101). 09/15/16  Yes Nicholes Mango, MD  albuterol (PROVENTIL HFA;VENTOLIN HFA) 108 (90 Base) MCG/ACT inhaler Inhale 2 puffs into the lungs every 6 (six) hours as needed for wheezing or shortness of breath. 09/15/16  Yes Nicholes Mango, MD  amLODipine (NORVASC) 10 MG tablet Take 10 mg by mouth daily.   Yes Historical Provider, MD  amoxicillin-clavulanate (AUGMENTIN) 500-125 MG tablet Take 1 tablet (500 mg total) by mouth 3 (three) times daily. 09/15/16  Yes Nicholes Mango, MD  carvedilol (COREG) 6.25 MG tablet Take 6.25 mg by mouth 2 (two) times daily with a meal.   Yes Historical Provider, MD  cloNIDine (CATAPRES - DOSED IN MG/24 HR) 0.3 mg/24hr patch Place 0.3 mg onto the skin once a week.   Yes Historical Provider, MD  Ferrous Sulfate (IRON) 325 (65 Fe) MG TABS Take by mouth.   Yes Historical Provider, MD  guaiFENesin (MUCINEX) 600 MG 12 hr tablet Take 1 tablet (600 mg total) by mouth 2 (two) times daily. 09/15/16  Yes Nicholes Mango, MD  omeprazole (PRILOSEC) 20 MG capsule Take 20 mg by mouth daily.   Yes Historical Provider, MD  telmisartan (MICARDIS) 80 MG tablet Take 80 mg by mouth daily.   Yes Historical Provider, MD  traMADol (ULTRAM) 50 MG tablet Take 1 tablet (50 mg total) by mouth every 6 (six) hours as needed for moderate pain. 09/15/16  Yes Nicholes Mango, MD      PHYSICAL EXAMINATION:   VITAL SIGNS: Blood pressure (!)  111/43, pulse 71, temperature 98.4 F (36.9 C), temperature source Oral, resp. rate 15, height 5\' 9"  (1.753 m), weight 86.2 kg (190 lb), SpO2 97 %.  GENERAL:  83 y.o.-year-old patient lying in the bed with no acute distress.  EYES: Pupils equal, round, reactive to light and accommodation. No scleral icterus. Extraocular muscles intact.  HEENT: Head atraumatic, normocephalic. Oropharynx dry and nasopharynx clear.  NECK:  Supple, no jugular venous distention. No thyroid enlargement, no tenderness.  LUNGS: Decreased breath sounds bilaterally, no wheezing, rales heard in left lung. No use of accessory muscles of respiration.  CARDIOVASCULAR: S1, S2 normal. No murmurs, rubs, or gallops.  ABDOMEN: Soft, nontender, nondistended. Bowel sounds present. No organomegaly or mass.  EXTREMITIES: No pedal edema, cyanosis, or clubbing.  NEUROLOGIC: Cranial nerves II through XII are intact. Muscle strength 5/5 in all extremities. Sensation intact. Gait not checked.  PSYCHIATRIC: The patient is alert and oriented x 3.  SKIN: No obvious rash, lesion, or ulcer.   LABORATORY PANEL:   CBC  Recent Labs Lab 09/24/16 0026  WBC 11.4*  HGB 8.5*  HCT 24.8*  PLT 162  MCV 80.1  MCH 27.4  MCHC 34.2  RDW 15.9*  LYMPHSABS 0.8*  MONOABS 1.5*  EOSABS 0.0  BASOSABS 0.0   ------------------------------------------------------------------------------------------------------------------  Chemistries   Recent Labs Lab 09/24/16 0026  NA 133*  K 4.7  CL 99*  CO2 25  GLUCOSE 122*  BUN 50*  CREATININE 2.35*  CALCIUM 8.2*   ------------------------------------------------------------------------------------------------------------------ estimated creatinine clearance is 20.5 mL/min (by C-G formula based on SCr of 2.35 mg/dL (H)). ------------------------------------------------------------------------------------------------------------------ No results for input(s): TSH, T4TOTAL, T3FREE, THYROIDAB in the  last 72 hours.  Invalid input(s): FREET3   Coagulation profile No results for input(s): INR, PROTIME in the last 168 hours. ------------------------------------------------------------------------------------------------------------------- No results for input(s): DDIMER in the last 72 hours. -------------------------------------------------------------------------------------------------------------------  Cardiac Enzymes  Recent Labs Lab 09/24/16 0026  TROPONINI 0.04*   ------------------------------------------------------------------------------------------------------------------ Invalid input(s): POCBNP  ---------------------------------------------------------------------------------------------------------------  Urinalysis No results found for: COLORURINE, APPEARANCEUR, LABSPEC, PHURINE, GLUCOSEU, HGBUR, BILIRUBINUR, KETONESUR, PROTEINUR, UROBILINOGEN, NITRITE, LEUKOCYTESUR   RADIOLOGY: Dg Chest 2 View  Result Date: 09/24/2016 CLINICAL DATA:  Acute onset of worsening generalized weakness and shortness of breath. Fever. Initial encounter. EXAM: CHEST  2 VIEW COMPARISON:  Chest radiograph performed 09/12/2016 FINDINGS: The lungs are well-aerated. Bibasilar airspace opacities, left greater than right, raise concern for slightly worsening multifocal pneumonia. A small left pleural effusion is noted. There is no evidence of pneumothorax. The heart is mildly enlarged. No acute osseous abnormalities are seen. IMPRESSION: 1. Bibasilar airspace opacities, left greater than right, raise concern for slightly worsening multifocal pneumonia. Small left pleural effusion noted. 2. Mild cardiomegaly. Electronically Signed   By: Garald Balding M.D.   On: 09/24/2016 01:36    EKG: Orders placed or performed during the hospital encounter of 09/23/16  . ED EKG  . ED EKG    IMPRESSION AND PLAN: 83 year old elderly female patient with history of pneumonia, hypertension presented to the  emergency room with shortness of  breath and cough and weakness. Admitting diagnosis 1. Healthcare associated pneumonia 2. Hyponatremia 3. Acute renal insufficiency 4. Dehydration 5. Hypertension 6. Borderline troponin secondary to demand ischemia Treatment plan Admit patient to medical floor Oxygen via nasal cannula Start patient on IV vancomycin and IV cefepime antibiotics DVT prophylaxis subcutaneous heparin 5000 units every 8 hourly IV fluid hydration Follow up sodium level and renal function Supportive care  All the records are reviewed and case discussed with ED provider. Management plans discussed with the patient, family and they are in agreement.  CODE STATUS:FULL CODE Surrogate decision maker : son Code Status History    Date Active Date Inactive Code Status Order ID Comments User Context   09/12/2016  5:28 PM 09/16/2016  3:24 PM Full Code [id number]  Theodoro Grist, MD ED       TOTAL TIME TAKING CARE OF THIS PATIENT: 57 minutes.    Saundra Shelling M.D on 09/24/2016 at 2:26 AM  Between 7am to 6pm - Pager - [phone]  After 6pm go to [email or web address] - password [email or web address] [place] [facility]  Office  [phone]  CC: Primary care physician; [facility]

## 2016-09-24 NOTE — Progress Notes (Signed)
[place] at [place] NAME: Felicia [name]    MR#:  [id number]  DATE OF BIRTH:  [birth year]  SUBJECTIVE:  CHIEF COMPLAINT:   Chief Complaint  Patient presents with  . Shortness of Breath    Was recently treated for sepsis and pneumonia and sent home with home health 2 weeks ago, she says she never felt better and continued to feel very weak. He came to Hospital again and found to have pneumonia.  On broad-spectrum antibiotic, very upset with overall care she received from [facility] last time.  REVIEW OF SYSTEMS:  CONSTITUTIONAL: No fever, fatigue or weakness.  EYES: No blurred or double vision.  EARS, NOSE, AND THROAT: No tinnitus or ear pain.  RESPIRATORY: positive for cough, shortness of breath,no wheezing or hemoptysis.  CARDIOVASCULAR: No chest pain, orthopnea, edema.  GASTROINTESTINAL: No nausea, vomiting, diarrhea or abdominal pain.  GENITOURINARY: No dysuria, hematuria.  ENDOCRINE: No polyuria, nocturia,  HEMATOLOGY: No anemia, easy bruising or bleeding SKIN: No rash or lesion. MUSCULOSKELETAL: No joint pain or arthritis.   NEUROLOGIC: No tingling, numbness, weakness.  PSYCHIATRY: No anxiety or depression.   ROS  DRUG ALLERGIES:  No Known Allergies  VITALS:  Blood pressure (!) 148/49, pulse 72, temperature 99.8 F (37.7 C), temperature source Oral, resp. rate 12, height 5\' 9"  (1.753 m), weight 84.9 kg (187 lb 1.6 oz), SpO2 96 %.  PHYSICAL EXAMINATION:  GENERAL:  83 y.o.-year-old patient lying in the bed with no acute distress.  EYES: Pupils equal, round, reactive to light and accommodation. No scleral icterus. Extraocular muscles intact.  HEENT: Head atraumatic, normocephalic. Oropharynx and nasopharynx clear.  NECK:  Supple, no jugular venous distention. No thyroid enlargement, no tenderness.  LUNGS: Normal breath sounds bilaterally, no wheezing, some crepitation. No use of accessory muscles of respiration.    On supplemental  oxygen. CARDIOVASCULAR: S1, S2 normal. No murmurs, rubs, or gallops.  ABDOMEN: Soft, nontender, nondistended. Bowel sounds present. No organomegaly or mass.  EXTREMITIES: No pedal edema, cyanosis, or clubbing.  NEUROLOGIC: Cranial nerves II through XII are intact. Muscle strength 4/5 in all extremities. Sensation intact. Gait not checked.  PSYCHIATRIC: The patient is alert and oriented x 3.  SKIN: No obvious rash, lesion, or ulcer.   Physical Exam LABORATORY PANEL:   CBC  Recent Labs Lab 09/24/16 0458  WBC 10.4  HGB 8.0*  HCT 23.4*  PLT 145*   ------------------------------------------------------------------------------------------------------------------  Chemistries   Recent Labs Lab 09/24/16 0458  NA 133*  K 4.2  CL 98*  CO2 26  GLUCOSE 131*  BUN 45*  CREATININE 2.14*  CALCIUM 8.1*   ------------------------------------------------------------------------------------------------------------------  Cardiac Enzymes  Recent Labs Lab 09/24/16 1122 09/24/16 1653  TROPONINI 0.03* 0.04*   ------------------------------------------------------------------------------------------------------------------  RADIOLOGY:  Dg Chest 2 View  Result Date: 09/24/2016 CLINICAL DATA:  Acute onset of worsening generalized weakness and shortness of breath. Fever. Initial encounter. EXAM: CHEST  2 VIEW COMPARISON:  Chest radiograph performed 09/12/2016 FINDINGS: The lungs are well-aerated. Bibasilar airspace opacities, left greater than right, raise concern for slightly worsening multifocal pneumonia. A small left pleural effusion is noted. There is no evidence of pneumothorax. The heart is mildly enlarged. No acute osseous abnormalities are seen. IMPRESSION: 1. Bibasilar airspace opacities, left greater than right, raise concern for slightly worsening multifocal pneumonia. Small left pleural effusion noted. 2. Mild cardiomegaly. Electronically Signed   By: Garald Balding M.D.   On:  09/24/2016 01:36    ASSESSMENT AND PLAN:  Principal Problem:   HCAP (healthcare-associated pneumonia)  * HCAp       On cefepime.   MRSA screen negative.   Try to get sputum culture.  * Ac on CKD stage 3   IV fluids and monitor  * Htn   Coreg, avapro, clonidine.  * hyponatremia    Likely due to infection    Monitor.  * ac respi failure   Due to Pneumonia   Was not on Oxygen before last admission.  All the records are reviewed and case discussed with Care Management/Social Workerr. Management plans discussed with the patient, family and they are in agreement.  CODE STATUS: Full.  TOTAL TIME TAKING CARE OF THIS PATIENT: 35 minutes.   POSSIBLE D/C IN 1-2 DAYS, DEPENDING ON CLINICAL CONDITION.   Vaughan Basta M.D on 09/24/2016   Between 7am to 6pm - Pager - [phone]  After 6pm go to [email or web address] - password [email or web address] [place] [facility]  Office  [phone]  CC: Primary care physician; [facility]  Note: This dictation was prepared with Dragon dictation along with smaller phrase technology. Any transcriptional errors that result from this process are unintentional.

## 2016-09-25 LAB — BASIC METABOLIC PANEL
Anion gap: 6 (ref 5–15)
BUN: 36 mg/dL — ABNORMAL HIGH (ref 6–20)
CO2: 26 mmol/L (ref 22–32)
Calcium: 8.1 mg/dL — ABNORMAL LOW (ref 8.9–10.3)
Chloride: 102 mmol/L (ref 101–111)
Creatinine, Ser: 1.54 mg/dL — ABNORMAL HIGH (ref 0.44–1.00)
GFR calc Af Amer: 34 mL/min — ABNORMAL LOW (ref >60)
GFR calc non Af Amer: 30 mL/min — ABNORMAL LOW (ref >60)
Glucose, Bld: 99 mg/dL (ref 65–99)
Potassium: 3.7 mmol/L (ref 3.5–5.1)
Sodium: 134 mmol/L — ABNORMAL LOW (ref 135–145)

## 2016-09-25 LAB — EXPECTORATED SPUTUM ASSESSMENT W REFEX TO RESP CULTURE

## 2016-09-25 LAB — EXPECTORATED SPUTUM ASSESSMENT W GRAM STAIN, RFLX TO RESP C: Special Requests: NORMAL

## 2016-09-25 NOTE — Evaluation (Signed)
Physical Therapy Evaluation Patient Details Name: Felicia [name] MRN: [id number] DOB: 1929/08/21 Today's Date: 09/25/2016   History of Present Illness  Pt admitted for HCAP with recent admission for similar symptoms. Pt with history of pneumonia and HTN. Pt with complaints of weakness and SOB symptoms.   Clinical Impression  Pt is a pleasant 83 year old female who was admitted for HCAP. Pt performs bed mobility with mod I, transfers with cga, and ambulation with cga and RW. All mobility performed on 2L of O2 with sats WNL. Per RN, pt slightly dizzy with mobility earlier this date. Chair follow for safety. Pt demonstrates deficits with strength/mobility/endurance. Pt very motivated to walk and would benefit from continued ambulation with RN staff, RN aware. Would benefit from skilled PT to address above deficits and promote optimal return to PLOF. Recommend transition to [place] upon discharge from acute hospitalization.       Follow Up Recommendations Home health PT    Equipment Recommendations       Recommendations for Other Services       Precautions / Restrictions Precautions Precautions: Fall Restrictions Weight Bearing Restrictions: No      Mobility  Bed Mobility Overal bed mobility: Modified Independent             General bed mobility comments: safe technique performed.  Transfers Overall transfer level: Needs assistance Equipment used: Rolling walker (2 wheeled) Transfers: Sit to/from Stand Sit to Stand: Min guard         General transfer comment: Safe technique performed with RW and 2L of O2. No LOB noted  Ambulation/Gait Ambulation/Gait assistance: Min guard Ambulation Distance (Feet): 200 Feet Assistive device: Rolling walker (2 wheeled) Gait Pattern/deviations: Step-through pattern     General Gait Details: ambulated around RN station with reciprocal gait pattern. Safe technique performed with correct use of RW. All mobility performed on 2L  of O2 with sats at 91% post ambulation. Chair follow for safety. No dizziness noted  Stairs            Wheelchair Mobility    Modified Rankin (Stroke Patients Only)       Balance Overall balance assessment: Needs assistance Sitting-balance support: Feet supported Sitting balance-Leahy Scale: Good     Standing balance support: Bilateral upper extremity supported Standing balance-Leahy Scale: Good                               Pertinent Vitals/Pain Pain Assessment: Faces Faces Pain Scale: Hurts little more Pain Location: burning sensation in B LE; reports improvement with walking Pain Descriptors / Indicators: Burning;Constant;Aching Pain Intervention(s): Limited activity within patient's tolerance;Repositioned    Home Living Family/patient expects to be discharged to:: Private residence Living Arrangements: Alone   Type of Home: Mobile home Home Access: Stairs to enter Entrance Stairs-Rails: Can reach both;Left;Right Entrance Stairs-Number of Steps: 8 Home Layout: One level Home Equipment: Cane - single point;Walker - 2 wheels      Prior Function Level of Independence: Independent with assistive device(s)         Comments: has been using RW in home     Hand Dominance   Dominant Hand: Right    Extremity/Trunk Assessment   Upper Extremity Assessment Upper Extremity Assessment: Generalized weakness (B UE grossly 4/5)    Lower Extremity Assessment Lower Extremity Assessment: Generalized weakness (B LE grossly 4/5)       Communication   Communication: No difficulties  Cognition Arousal/Alertness:  Awake/alert Behavior During Therapy: WFL for tasks assessed/performed Overall Cognitive Status: Within Functional Limits for tasks assessed                      General Comments      Exercises Other Exercises Other Exercises: Seated ther-ex performed on B LE including ankle pumps, quad sets, hip abd/add, and hip add sqeezes. All  ther-ex performed x 10 reps with supervision and cues for technique.   Assessment/Plan    PT Assessment Patient needs continued PT services  PT Problem List Decreased strength;Decreased activity tolerance;Decreased balance;Decreased mobility          PT Treatment Interventions Gait training;DME instruction;Therapeutic exercise    PT Goals (Current goals can be found in the Care Plan section)  Acute Rehab PT Goals Patient Stated Goal: to feel better PT Goal Formulation: With patient Time For Goal Achievement: 10/09/16 Potential to Achieve Goals: Good    Frequency Min 2X/week   Barriers to discharge Decreased caregiver support      Co-evaluation               End of Session Equipment Utilized During Treatment: Gait belt;Oxygen Activity Tolerance: Patient tolerated treatment well Patient left: in chair;with chair alarm set Nurse Communication: Mobility status         Time: 7078-6754 PT Time Calculation (min) (ACUTE ONLY): 21 min   Charges:   PT Evaluation $PT Eval Moderate Complexity: 1 Procedure PT Treatments $Therapeutic Exercise: 8-22 mins   PT G Codes:        Tina Temme October 11, 2016, 1:45 PM Greggory Stallion, PT, DPT [phone]

## 2016-09-25 NOTE — Progress Notes (Signed)
[place] at [place] NAME: Felicia [name]    MR#:  [id number]  DATE OF BIRTH:  Sep 29, 1928  SUBJECTIVE:  CHIEF COMPLAINT:   Chief Complaint  Patient presents with  . Shortness of Breath    Was recently treated for sepsis and pneumonia and sent home with home health 2 weeks ago, she says she never felt better and continued to feel very weak. He came to Hospital again and found to have pneumonia.  On broad-spectrum antibiotic, very upset with overall care she received from [facility] last time.   Still feels very weak.  REVIEW OF SYSTEMS:  CONSTITUTIONAL: No fever, fatigue or weakness.  EYES: No blurred or double vision.  EARS, NOSE, AND THROAT: No tinnitus or ear pain.  RESPIRATORY: positive for cough, shortness of breath,no wheezing or hemoptysis.  CARDIOVASCULAR: No chest pain, orthopnea, edema.  GASTROINTESTINAL: No nausea, vomiting, diarrhea or abdominal pain.  GENITOURINARY: No dysuria, hematuria.  ENDOCRINE: No polyuria, nocturia,  HEMATOLOGY: No anemia, easy bruising or bleeding SKIN: No rash or lesion. MUSCULOSKELETAL: No joint pain or arthritis.   NEUROLOGIC: No tingling, numbness, weakness.  PSYCHIATRY: No anxiety or depression.   ROS  DRUG ALLERGIES:  No Known Allergies  VITALS:  Blood pressure (!) 174/46, pulse 70, temperature 99.5 F (37.5 C), temperature source Oral, resp. rate 19, height 5\' 9"  (1.753 m), weight 84.9 kg (187 lb 1.6 oz), SpO2 98 %.  PHYSICAL EXAMINATION:  GENERAL:  83 y.o.-year-old patient lying in the bed with no acute distress.  EYES: Pupils equal, round, reactive to light and accommodation. No scleral icterus. Extraocular muscles intact.  HEENT: Head atraumatic, normocephalic. Oropharynx and nasopharynx clear.  NECK:  Supple, no jugular venous distention. No thyroid enlargement, no tenderness.  LUNGS: Normal breath sounds bilaterally, no wheezing, some crepitation. No use of accessory muscles of  respiration.    On supplemental oxygen. CARDIOVASCULAR: S1, S2 normal. No murmurs, rubs, or gallops.  ABDOMEN: Soft, nontender, nondistended. Bowel sounds present. No organomegaly or mass.  EXTREMITIES: No pedal edema, cyanosis, or clubbing.  NEUROLOGIC: Cranial nerves II through XII are intact. Muscle strength 4/5 in all extremities. Sensation intact. Gait not checked.  PSYCHIATRIC: The patient is alert and oriented x 3.  SKIN: No obvious rash, lesion, or ulcer.   Physical Exam LABORATORY PANEL:   CBC  Recent Labs Lab 09/24/16 0458  WBC 10.4  HGB 8.0*  HCT 23.4*  PLT 145*   ------------------------------------------------------------------------------------------------------------------  Chemistries   Recent Labs Lab 09/25/16 0441  NA 134*  K 3.7  CL 102  CO2 26  GLUCOSE 99  BUN 36*  CREATININE 1.54*  CALCIUM 8.1*   ------------------------------------------------------------------------------------------------------------------  Cardiac Enzymes  Recent Labs Lab 09/24/16 1122 09/24/16 1653  TROPONINI 0.03* 0.04*   ------------------------------------------------------------------------------------------------------------------  RADIOLOGY:  Dg Chest 2 View  Result Date: 09/24/2016 CLINICAL DATA:  Acute onset of worsening generalized weakness and shortness of breath. Fever. Initial encounter. EXAM: CHEST  2 VIEW COMPARISON:  Chest radiograph performed 09/12/2016 FINDINGS: The lungs are well-aerated. Bibasilar airspace opacities, left greater than right, raise concern for slightly worsening multifocal pneumonia. A small left pleural effusion is noted. There is no evidence of pneumothorax. The heart is mildly enlarged. No acute osseous abnormalities are seen. IMPRESSION: 1. Bibasilar airspace opacities, left greater than right, raise concern for slightly worsening multifocal pneumonia. Small left pleural effusion noted. 2. Mild cardiomegaly. Electronically Signed    By: Garald Balding M.D.   On: 09/24/2016 01:36  ASSESSMENT AND PLAN:   Principal Problem:   HCAP (healthcare-associated pneumonia)  * HCAp       On cefepime.   MRSA screen negative.   Try to get sputum culture.   Fever- Get blood culture.  * Ac on CKD stage 3   IV fluids and monitor- improved.  * Htn   Coreg, avapro, clonidine.  * hyponatremia    Likely due to infection    Monitor.  * ac respi failure   Due to Pneumonia   Was not on Oxygen before last admission.  All the records are reviewed and case discussed with Care Management/Social Workerr. Management plans discussed with the patient, family and they are in agreement.  CODE STATUS: Full.  TOTAL TIME TAKING CARE OF THIS PATIENT: 35 minutes.   POSSIBLE D/C IN 1-2 DAYS, DEPENDING ON CLINICAL CONDITION.   Vaughan Basta M.D on 09/25/2016   Between 7am to 6pm - Pager - [phone]  After 6pm go to [email or web address] - password [email or web address] [facility]  Office  [phone]  CC: Primary care physician; [facility] Acute C  Note: This dictation was prepared with Dragon dictation along with smaller phrase technology. Any transcriptional errors that result from this process are unintentional.

## 2016-09-25 NOTE — Progress Notes (Signed)
[facility]  Patient Status: Active  [facility] is providing the following services: SN/PT/SW  If patient discharges after hours, please call [phone].   Felicia [name] 09/25/2016, 10:07 AM

## 2016-09-26 LAB — CBC
HCT: 23.8 % — ABNORMAL LOW (ref 35.0–47.0)
Hemoglobin: 8.2 g/dL — ABNORMAL LOW (ref 12.0–16.0)
MCH: 27.8 pg (ref 26.0–34.0)
MCHC: 34.7 g/dL (ref 32.0–36.0)
MCV: 80.2 fL (ref 80.0–100.0)
Platelets: 159 10*3/uL (ref 150–440)
RBC: 2.96 MIL/uL — ABNORMAL LOW (ref 3.80–5.20)
RDW: 15.6 % — ABNORMAL HIGH (ref 11.5–14.5)
WBC: 4.8 10*3/uL (ref 3.6–11.0)

## 2016-09-26 LAB — BASIC METABOLIC PANEL
Anion gap: 7 (ref 5–15)
BUN: 25 mg/dL — ABNORMAL HIGH (ref 6–20)
CO2: 27 mmol/L (ref 22–32)
Calcium: 8.5 mg/dL — ABNORMAL LOW (ref 8.9–10.3)
Chloride: 103 mmol/L (ref 101–111)
Creatinine, Ser: 1.22 mg/dL — ABNORMAL HIGH (ref 0.44–1.00)
GFR calc Af Amer: 45 mL/min — ABNORMAL LOW (ref >60)
GFR calc non Af Amer: 39 mL/min — ABNORMAL LOW (ref >60)
Glucose, Bld: 89 mg/dL (ref 65–99)
Potassium: 3.6 mmol/L (ref 3.5–5.1)
Sodium: 137 mmol/L (ref 135–145)

## 2016-09-26 MED ORDER — FERROUS SULFATE 325 (65 FE) MG PO TABS
325.0000 mg | ORAL_TABLET | Freq: Two times a day (BID) | ORAL | Status: DC
  Administered 2016-09-26 – 2016-09-27 (×2): 325 mg via ORAL
  Filled 2016-09-26 (×2): qty 1

## 2016-09-26 MED ORDER — METOCLOPRAMIDE HCL 10 MG PO TABS
5.0000 mg | ORAL_TABLET | Freq: Once | ORAL | Status: AC
  Administered 2016-09-26: 5 mg via ORAL
  Filled 2016-09-26: qty 1

## 2016-09-26 MED ORDER — NITROGLYCERIN 0.4 MG SL SUBL
0.4000 mg | SUBLINGUAL_TABLET | SUBLINGUAL | Status: DC | PRN
  Administered 2016-09-26: 0.4 mg via SUBLINGUAL
  Filled 2016-09-26: qty 1

## 2016-09-26 NOTE — Progress Notes (Addendum)
Physical Therapy Treatment Patient Details Name: Otilia Kareem MRN: [id number] DOB: 13-May-1929 Today's Date: 09/26/2016    History of Present Illness Pt admitted for HCAP with recent admission for similar symptoms. Pt with history of pneumonia and HTN. Pt with complaints of weakness and SOB symptoms.     PT Comments    Pt is making good progress towards goals with increased safety with mobility and ambulation. Pt reports she "just wants to walk today". All mobility performed with 2L of O2 donned with sats WNL with increased speed compared to previous session. Pt very motivated to perform therapy. Pt is nearing baseline level. Educated and reinforced pt to continue mobility efforts with Conservation officer, historic buildings.  Follow Up Recommendations  Home health PT     Equipment Recommendations       Recommendations for Other Services       Precautions / Restrictions Precautions Precautions: Fall Restrictions Weight Bearing Restrictions: No    Mobility  Bed Mobility Overal bed mobility: Modified Independent             General bed mobility comments: safe technique performed.  Transfers Overall transfer level: Needs assistance Equipment used: Rolling walker (2 wheeled) Transfers: Sit to/from Stand Sit to Stand: Supervision         General transfer comment: safe technique performed with RW and upright posture. Once standing, pt able to stand with upright posture.  Ambulation/Gait Ambulation/Gait assistance: Supervision Ambulation Distance (Feet): 200 Feet Assistive device: Rolling walker (2 wheeled) Gait Pattern/deviations: Step-through pattern     General Gait Details: ambulated around RN station with 2L of O2 donned. Pt with reciprocal gait pattern and safe technique. All O2 sats WNL.   Stairs            Wheelchair Mobility    Modified Rankin (Stroke Patients Only)       Balance                                    Cognition Arousal/Alertness:  Awake/alert Behavior During Therapy: WFL for tasks assessed/performed Overall Cognitive Status: Within Functional Limits for tasks assessed                      Exercises      General Comments        Pertinent Vitals/Pain Pain Assessment: No/denies pain Pain Location: reported that she didn't sleep well last night    Home Living                      Prior Function            PT Goals (current goals can now be found in the care plan section) Acute Rehab PT Goals Patient Stated Goal: to go home PT Goal Formulation: With patient Time For Goal Achievement: 10/09/16 Potential to Achieve Goals: Good Progress towards PT goals: Progressing toward goals    Frequency    Min 2X/week      PT Plan Current plan remains appropriate    Co-evaluation             End of Session Equipment Utilized During Treatment: Gait belt;Oxygen Activity Tolerance: Patient tolerated treatment well Patient left: in bed;with bed alarm set;with family/visitor present (son in recliner)     Time: 0932-6712 PT Time Calculation (min) (ACUTE ONLY): 18 min  Charges:  $Gait Training: 8-22 mins  G Codes:      Aleksi Brummet 09/26/2016, 3:18 PM Greggory Stallion, PT, DPT [phone]

## 2016-09-26 NOTE — Progress Notes (Addendum)
Notified dr.diamond of pt c/o feeling "yucky" and c/o nausea, restless. Pt is clammy and weak.  Acknowledged and orders placed.

## 2016-09-26 NOTE — Progress Notes (Signed)
[place] at [place] NAME: Felicia [name]    MR#:  [id number]  DATE OF BIRTH:  05-Aug-1929  SUBJECTIVE:  CHIEF COMPLAINT:   Chief Complaint  Patient presents with  . Shortness of Breath    Was recently treated for sepsis and pneumonia and sent home with home health 2 weeks ago, she says she never felt better and continued to feel very weak. He came to Hospital again and found to have pneumonia.  On broad-spectrum antibiotic, very upset with overall care she received from [facility] last time.   Still feels weak.   She said- she was feeling nauseated yesterday.  REVIEW OF SYSTEMS:  CONSTITUTIONAL: No fever, fatigue or weakness.  EYES: No blurred or double vision.  EARS, NOSE, AND THROAT: No tinnitus or ear pain.  RESPIRATORY: positive for cough, shortness of breath,no wheezing or hemoptysis.  CARDIOVASCULAR: No chest pain, orthopnea, edema.  GASTROINTESTINAL: No nausea, vomiting, diarrhea or abdominal pain.  GENITOURINARY: No dysuria, hematuria.  ENDOCRINE: No polyuria, nocturia,  HEMATOLOGY: No anemia, easy bruising or bleeding SKIN: No rash or lesion. MUSCULOSKELETAL: No joint pain or arthritis.   NEUROLOGIC: No tingling, numbness, weakness.  PSYCHIATRY: No anxiety or depression.   ROS  DRUG ALLERGIES:  No Known Allergies  VITALS:  Blood pressure (!) 165/59, pulse 69, temperature 98.6 F (37 C), temperature source Oral, resp. rate 18, height 5\' 9"  (1.753 m), weight 84.9 kg (187 lb 1.6 oz), SpO2 97 %.  PHYSICAL EXAMINATION:  GENERAL:  83 y.o.-year-old patient lying in the bed with no acute distress.  EYES: Pupils equal, round, reactive to light and accommodation. No scleral icterus. Extraocular muscles intact.  HEENT: Head atraumatic, normocephalic. Oropharynx and nasopharynx clear.  NECK:  Supple, no jugular venous distention. No thyroid enlargement, no tenderness.  LUNGS: Normal breath sounds bilaterally, no wheezing, some  crepitation. No use of accessory muscles of respiration.    On supplemental oxygen. CARDIOVASCULAR: S1, S2 normal. No murmurs, rubs, or gallops.  ABDOMEN: Soft, nontender, nondistended. Bowel sounds present. No organomegaly or mass.  EXTREMITIES: No pedal edema, cyanosis, or clubbing.  NEUROLOGIC: Cranial nerves II through XII are intact. Muscle strength 4/5 in all extremities. Sensation intact. Gait not checked.  PSYCHIATRIC: The patient is alert and oriented x 3.  SKIN: No obvious rash, lesion, or ulcer.   Physical Exam LABORATORY PANEL:   CBC  Recent Labs Lab 09/26/16 0517  WBC 4.8  HGB 8.2*  HCT 23.8*  PLT 159   ------------------------------------------------------------------------------------------------------------------  Chemistries   Recent Labs Lab 09/26/16 0517  NA 137  K 3.6  CL 103  CO2 27  GLUCOSE 89  BUN 25*  CREATININE 1.22*  CALCIUM 8.5*   ------------------------------------------------------------------------------------------------------------------  Cardiac Enzymes  Recent Labs Lab 09/24/16 1122 09/24/16 1653  TROPONINI 0.03* 0.04*   ------------------------------------------------------------------------------------------------------------------  RADIOLOGY:  No results found.  ASSESSMENT AND PLAN:   Principal Problem:   HCAP (healthcare-associated pneumonia)  * HCAp       On cefepime.   MRSA screen negative.   Try to get sputum culture.   Fever- Get blood culture. Negative for 12 hours.  * Ac on CKD stage 3   IV fluids and monitor- improved.  * Htn   Coreg, avapro, clonidine.  * hyponatremia    Likely due to infection    Monitor.  * ac respi failure   Due to Pneumonia   Was not on Oxygen before last admission.  All the records are reviewed and  case discussed with Care Management/Social Workerr. Management plans discussed with the patient, family and they are in agreement.  CODE STATUS: Full.  TOTAL TIME TAKING  CARE OF THIS PATIENT: 35 minutes.   POSSIBLE D/C IN 1-2 DAYS, DEPENDING ON CLINICAL CONDITION. As per PT - home with HHA, PT.  Vaughan Basta M.D on 09/26/2016   Between 7am to 6pm - Pager - [phone]  After 6pm go to [email or web address] - password [email or web address] [facility]  Office  [phone]  CC: Primary care physician; [facility] Acute C  Note: This dictation was prepared with Dragon dictation along with smaller phrase technology. Any transcriptional errors that result from this process are unintentional.

## 2016-09-27 MED ORDER — CEFUROXIME AXETIL 250 MG PO TABS: 250.0000 mg | ORAL_TABLET | Freq: Two times a day (BID) | ORAL | 0 refills | Status: AC

## 2016-09-27 MED ORDER — DEXTROSE 5 % IV SOLN
2.0000 g | Freq: Two times a day (BID) | INTRAVENOUS | Status: DC
  Administered 2016-09-27: 2 g via INTRAVENOUS
  Filled 2016-09-27 (×2): qty 2

## 2016-09-27 NOTE — Progress Notes (Signed)
Pharmacy Antibiotic Note  Felicia [name] is a 83 y.o. female admitted on 09/23/2016 with pneumonia.  Pharmacy has been consulted for vancomycin and cefepime dosing.  Plan: Cefepime 2 grams q 24 hours ordered now with improvement in renal function. Will increase to cefepime 2 g IV q12h.    Height: 5\' 9"  (175.3 cm) Weight: 187 lb 1.6 oz (84.9 kg) IBW/kg (Calculated) : 66.2  Temp (24hrs), Avg:98.6 F (37 C), Min:98.5 F (36.9 C), Max:98.8 F (37.1 C)   Recent Labs Lab 09/24/16 0026 09/24/16 0458 09/25/16 0441 09/26/16 0517  WBC 11.4* 10.4  --  4.8  CREATININE 2.35* 2.14* 1.54* 1.22*    Estimated Creatinine Clearance: 39.2 mL/min (by C-G formula based on SCr of 1.22 mg/dL (H)).    No Known Allergies  Antimicrobials this admission: vancomycin 1/23 >> 1/23 cefepime 1/23 >>    Microbiology results: BCx x2 NGTD MRSA PCR neg Influenza PCR neg   Thank you for allowing pharmacy to be a part of this patient's care.  Rocky Morel 09/27/2016 9:29 AM

## 2016-09-27 NOTE — Progress Notes (Signed)
Instructions reviewed with the pt and her son.  rx given to the patient.  Patient is being sent out via wheelchair with her belongings and her oxygen

## 2016-09-27 NOTE — Discharge Summary (Signed)
[facility] NAME: Felicia [name]    MR#:  [id number]  DATE OF BIRTH:  September 21, 1928  DATE OF ADMISSION:  09/23/2016 ADMITTING PHYSICIAN: Saundra Shelling, MD  DATE OF DISCHARGE: 09/27/2016  PRIMARY CARE PHYSICIAN: [facility]    ADMISSION DIAGNOSIS:  AKI (acute kidney injury) ([place]) [N17.9] HCAP (healthcare-associated pneumonia) [J18.9]  DISCHARGE DIAGNOSIS:  Principal Problem:   HCAP (healthcare-associated pneumonia)   SECONDARY DIAGNOSIS:   Past Medical History:  Diagnosis Date  . Hypertension   . Pneumonia     HOSPITAL COURSE:   * HCAp       On cefepime.   MRSA screen negative.   Try to get sputum culture. Could not collect   Fever- Get blood culture. Negative for 2 days.  Pt feeling better and stronger now., d/c home with HHA.  * Ac on CKD stage 3   IV fluids and monitor- improved.   This was due to Not having good oral intake due to pneumonia.   Encouraged to use supplements like Boost also with her food.  * Htn   Coreg, avapro, clonidine.  * hyponatremia    Likely due to infection and dehydration, Improved.    Monitor.  * ac respi failure   Due to Pneumonia   Was not on Oxygen before last admission.   Still hypoxic ,so need oXygen on discharge.  DISCHARGE CONDITIONS:   Stable.  CONSULTS OBTAINED:    DRUG ALLERGIES:  No Known Allergies  DISCHARGE MEDICATIONS:   Current Discharge Medication List    START taking these medications   Details  cefUROXime (CEFTIN) 250 MG tablet Take 1 tablet (250 mg total) by mouth 2 (two) times daily with a meal. Qty: 8 tablet, Refills: 0      CONTINUE these medications which have NOT CHANGED   Details  acetaminophen (TYLENOL) 325 MG tablet Take 2 tablets (650 mg total) by mouth every 6 (six) hours as needed for mild pain (or Fever >/= 101).    albuterol (PROVENTIL HFA;VENTOLIN HFA) 108 (90 Base) MCG/ACT inhaler Inhale 2 puffs into  the lungs every 6 (six) hours as needed for wheezing or shortness of breath. Qty: 1 Inhaler, Refills: 0    amLODipine (NORVASC) 10 MG tablet Take 10 mg by mouth daily.    carvedilol (COREG) 6.25 MG tablet Take 6.25 mg by mouth 2 (two) times daily with a meal.    cloNIDine (CATAPRES - DOSED IN MG/24 HR) 0.3 mg/24hr patch Place 0.3 mg onto the skin once a week.    Ferrous Sulfate (IRON) 325 (65 Fe) MG TABS Take by mouth.    guaiFENesin (MUCINEX) 600 MG 12 hr tablet Take 1 tablet (600 mg total) by mouth 2 (two) times daily.    omeprazole (PRILOSEC) 20 MG capsule Take 20 mg by mouth daily.    telmisartan (MICARDIS) 80 MG tablet Take 80 mg by mouth daily.    traMADol (ULTRAM) 50 MG tablet Take 1 tablet (50 mg total) by mouth every 6 (six) hours as needed for moderate pain. Qty: 15 tablet, Refills: 0      STOP taking these medications     amoxicillin-clavulanate (AUGMENTIN) 500-125 MG tablet          DISCHARGE INSTRUCTIONS:    Follow with PMD in 2 weeks,..  If you experience worsening of your admission symptoms, develop shortness of breath, life threatening emergency, suicidal or homicidal thoughts you must seek medical attention immediately by  calling 911 or calling your MD immediately  if symptoms less severe.  You Must read complete instructions/literature along with all the possible adverse reactions/side effects for all the Medicines you take and that have been prescribed to you. Take any new Medicines after you have completely understood and accept all the possible adverse reactions/side effects.   Please note  You were cared for by a hospitalist during your hospital stay. If you have any questions about your discharge medications or the care you received while you were in the hospital after you are discharged, you can call the unit and asked to speak with the hospitalist on call if the hospitalist that took care of you is not available. Once you are discharged, your primary  care physician will handle any further medical issues. Please note that NO REFILLS for any discharge medications will be authorized once you are discharged, as it is imperative that you return to your primary care physician (or establish a relationship with a primary care physician if you do not have one) for your aftercare needs so that they can reassess your need for medications and monitor your lab values.    Today   CHIEF COMPLAINT:   Chief Complaint  Patient presents with  . Shortness of Breath    HISTORY OF PRESENT ILLNESS:  Felicia [name]  is a 83 y.o. female with a known history of Pneumonia, hypertension presented to the emergency room with increased shortness of breath and weakness. She also has cough and chills. Cough is productive of greenish phlegm. Patient was recently treated for sepsis, pneumonia and discharged home on 14th of January 2018. She comes back pain to the emergency room with difficulty breathing weakness and cough. She says she continue taking oral Augmentin after being discharged for pneumonia. Patient lives alone and unable to take care of herself because of the sickness. She was worked up with chest x-ray showed left lung pneumonia and patient received IV antibiotics in the emergency room. Hospitalist service was consulted for further care of the patient. No complaints of any chest pain, palpitations.  VITAL SIGNS:  Blood pressure (!) 124/56, pulse 79, temperature 98.5 F (36.9 C), temperature source Oral, resp. rate (!) 22, height 5\' 9"  (1.753 m), weight 84.9 kg (187 lb 1.6 oz), SpO2 97 %.  I/O:   Intake/Output Summary (Last 24 hours) at 09/27/16 1243 Last data filed at 09/27/16 0400  Gross per 24 hour  Intake              125 ml  Output              900 ml  Net             -775 ml    PHYSICAL EXAMINATION:   GENERAL:  83 y.o.-year-old patient lying in the bed with no acute distress.  EYES: Pupils equal, round, reactive to light and accommodation.  No scleral icterus. Extraocular muscles intact.  HEENT: Head atraumatic, normocephalic. Oropharynx and nasopharynx clear.  NECK:  Supple, no jugular venous distention. No thyroid enlargement, no tenderness.  LUNGS: Normal breath sounds bilaterally, no wheezing, some crepitation. No use of accessory muscles of respiration.    On supplemental oxygen. CARDIOVASCULAR: S1, S2 normal. No murmurs, rubs, or gallops.  ABDOMEN: Soft, nontender, nondistended. Bowel sounds present. No organomegaly or mass.  EXTREMITIES: No pedal edema, cyanosis, or clubbing.  NEUROLOGIC: Cranial nerves II through XII are intact. Muscle strength 4/5 in all extremities. Sensation intact. Gait not checked.  PSYCHIATRIC:  The patient is alert and oriented x 3.  SKIN: No obvious rash, lesion, or ulcer.   DATA REVIEW:   CBC  Recent Labs Lab 09/26/16 0517  WBC 4.8  HGB 8.2*  HCT 23.8*  PLT 159    Chemistries   Recent Labs Lab 09/26/16 0517  NA 137  K 3.6  CL 103  CO2 27  GLUCOSE 89  BUN 25*  CREATININE 1.22*  CALCIUM 8.5*    Cardiac Enzymes  Recent Labs Lab 09/24/16 1653  TROPONINI 0.04*    Microbiology Results  Results for orders placed or performed during the hospital encounter of 09/23/16  MRSA PCR Screening     Status: None   Collection Time: 09/24/16  1:09 PM  Result Value Ref Range Status   MRSA by PCR NEGATIVE NEGATIVE Final    Comment:        The GeneXpert MRSA Assay (FDA approved for NASAL specimens only), is one component of a comprehensive MRSA colonization surveillance program. It is not intended to diagnose MRSA infection nor to guide or monitor treatment for MRSA infections.   Culture, expectorated sputum-assessment     Status: None   Collection Time: 09/25/16  5:50 PM  Result Value Ref Range Status   Specimen Description Expect. Sput  Final   Special Requests Normal  Final   Sputum evaluation   Final    Sputum specimen not acceptable for testing.  Please recollect.    SPOKE TO [facility] SIMSER 09/25/16 @ 1905  [place]    Report Status 09/25/2016 FINAL  Final  CULTURE, BLOOD (ROUTINE X 2) w Reflex to ID Panel     Status: None (Preliminary result)   Collection Time: 09/25/16 10:14 PM  Result Value Ref Range Status   Specimen Description BLOOD  R AC   Final   Special Requests   Final    BOTTLES DRAWN AEROBIC AND ANAEROBIC  AER 8 ML ANA 11 ML   Culture NO GROWTH 2 DAYS  Final   Report Status PENDING  Incomplete  CULTURE, BLOOD (ROUTINE X 2) w Reflex to ID Panel     Status: None (Preliminary result)   Collection Time: 09/25/16 10:20 PM  Result Value Ref Range Status   Specimen Description BLOOD  L HAND  Final   Special Requests   Final    BOTTLES DRAWN AEROBIC AND ANAEROBIC   AER 8 ML ANA 4 ML    Culture NO GROWTH 2 DAYS  Final   Report Status PENDING  Incomplete    RADIOLOGY:  No results found.  EKG:   Orders placed or performed during the hospital encounter of 09/23/16  . ED EKG  . ED EKG  . EKG 12-Lead  . EKG 12-Lead      Management plans discussed with the patient, family and they are in agreement.  CODE STATUS:     Code Status Orders        Start     Ordered   09/24/16 0325  Full code  Continuous     09/24/16 0324    Code Status History    Date Active Date Inactive Code Status Order ID Comments User Context   09/12/2016  5:28 PM 09/16/2016  3:24 PM Full Code [id number]  Theodoro Grist, MD ED      TOTAL TIME TAKING CARE OF THIS PATIENT: 35 minutes.    Vaughan Basta M.D on 09/27/2016 at 12:43 PM  Between 7am to 6pm - Pager - [phone]  After 6pm go  to [email or web address] - password [email or web address] [facility]  Office  [phone]  CC: Primary care physician; [facility]   Note: This dictation was prepared with Dragon dictation along with smaller phrase technology. Any transcriptional errors that result from this process are unintentional.

## 2016-09-27 NOTE — Care Management (Signed)
Patient to discharge home today.  Lives at home alone.  Adult son lives locally for support.  Patient open with [facility].  Resumption orders have been placed.  Corene Cornea with [facility] notified.  Patient has chronic home O2 that was set up previous admission.  Patient was attempted to wean, however was unable.  Son to transport at discharge with portable tank. Information on Med Alert provided at son's request.  RNCM signing off

## 2016-09-27 NOTE — Care Management Important Message (Signed)
Important Message  Patient Details  Name: Felicia [name] MRN: [id number] Date of Birth: December 29, 1928   Medicare Important Message Given:  Yes    Beverly Sessions, RN 09/27/2016, 1:10 PM

## 2016-09-30 LAB — CULTURE, BLOOD (ROUTINE X 2)
Culture: NO GROWTH
Culture: NO GROWTH

## 2017-04-28 ENCOUNTER — Emergency Department: Payer: Medicare Other

## 2017-04-28 ENCOUNTER — Emergency Department
Admission: EM | Admit: 2017-04-28 | Discharge: 2017-04-29 | Disposition: A | Discharge status: Home / Self Care | Payer: Medicare Other | Attending: Emergency Medicine | Admitting: Emergency Medicine

## 2017-04-28 DIAGNOSIS — H8149 Vertigo of central origin, unspecified ear: Secondary | ICD-10-CM | POA: Diagnosis not present

## 2017-04-28 DIAGNOSIS — R42 Dizziness and giddiness: Secondary | ICD-10-CM

## 2017-04-28 DIAGNOSIS — I10 Essential (primary) hypertension: Secondary | ICD-10-CM | POA: Insufficient documentation

## 2017-04-28 DIAGNOSIS — Z79899 Other long term (current) drug therapy: Secondary | ICD-10-CM | POA: Diagnosis not present

## 2017-04-28 HISTORY — DX: Gastro-esophageal reflux disease without esophagitis: K21.9

## 2017-04-28 HISTORY — DX: Dizziness and giddiness: R42

## 2017-04-28 HISTORY — DX: Unspecified hearing loss, unspecified ear: H91.90

## 2017-04-28 MED ORDER — PROCHLORPERAZINE EDISYLATE 5 MG/ML IJ SOLN
5.0000 mg | Freq: Once | INTRAMUSCULAR | Status: AC
  Administered 2017-04-28: 5 mg via INTRAVENOUS
  Filled 2017-04-28: qty 2

## 2017-04-28 MED ORDER — MECLIZINE HCL 25 MG PO TABS: 25.0000 mg | ORAL_TABLET | Freq: Three times a day (TID) | ORAL | 0 refills | Status: DC | PRN

## 2017-04-28 MED ORDER — MECLIZINE HCL 25 MG PO TABS
25.0000 mg | ORAL_TABLET | Freq: Once | ORAL | Status: AC
Start: 2017-04-28 — End: 2017-04-28
  Administered 2017-04-28: 25 mg via ORAL
  Filled 2017-04-28: qty 1

## 2017-04-28 NOTE — Discharge Instructions (Signed)
Please schedule an appointment to follow-up with the ear nose and throat specialist within the next week for reexamination. Return to the emergency department for any concerns such as if her vertigo worsens, if you cannot stand, or for any other issues.

## 2017-04-28 NOTE — ED Provider Notes (Signed)
-----------------------------------------   12:34 PM on 04/28/2017 -----------------------------------------  MRI is negative. We will discharge the patient home. I discussed results with the patient she is agreeable to this plan.   Harvest Dark, MD 04/28/17 1234

## 2017-04-28 NOTE — ED Notes (Signed)
Call light answered. Pt needs phone to call son to being clothes. Pt provided phone and number was dialed for pt.

## 2017-04-28 NOTE — ED Triage Notes (Signed)
Per EMS: patient c/o vertigo and emesis. Pt given 4 mg of Zofran in route to ED.  Patient c/o dizziness/, nausea, vomiting

## 2017-04-28 NOTE — ED Provider Notes (Signed)
[facility] Emergency Department Provider Note  ____________________________________________   First MD Initiated Contact with Patient 04/28/17 [phone]     (approximate)  I have reviewed the triage vital signs and the nursing notes.   HISTORY  Chief Complaint Dizziness    HPI Felicia [name] is a 83 y.o. female who comes to the emergency department via EMS with constant room spinning vertigo that began at 4 AM roughly an hour and a half prior to arrival. She was in her usual state of health last night when she went to bed at 10 PM. She has a long-standing history of vertigo, however she does not know what kind. She denies headache. She denies numbness or weakness. The symptoms are worse when she opens her eyes and improved somewhat when she closes her eyes although they did not go away.   Past Medical History:  Diagnosis Date  . Hypertension   . Pneumonia     Patient Active Problem List   Diagnosis Date Noted  . HCAP (healthcare-associated pneumonia) 09/24/2016  . Sepsis ([place]) 09/12/2016  . Community acquired pneumonia 09/12/2016  . Hypoxia 09/12/2016  . Pleuritic chest pain 09/12/2016  . Pneumonia 09/12/2016    No past surgical history on file.  Prior to Admission medications   Medication Sig Start Date End Date Taking? Authorizing Provider  acetaminophen (TYLENOL) 325 MG tablet Take 2 tablets (650 mg total) by mouth every 6 (six) hours as needed for mild pain (or Fever >/= 101). 09/15/16   Gouru, Illene Silver, MD  albuterol (PROVENTIL HFA;VENTOLIN HFA) 108 (90 Base) MCG/ACT inhaler Inhale 2 puffs into the lungs every 6 (six) hours as needed for wheezing or shortness of breath. 09/15/16   Gouru, Illene Silver, MD  amLODipine (NORVASC) 10 MG tablet Take 10 mg by mouth daily.    [provider]  carvedilol (COREG) 6.25 MG tablet Take 6.25 mg by mouth 2 (two) times daily with a meal.    [provider]  cloNIDine (CATAPRES - DOSED IN MG/24  HR) 0.3 mg/24hr patch Place 0.3 mg onto the skin once a week.    [provider]  Ferrous Sulfate (IRON) 325 (65 Fe) MG TABS Take by mouth.    [provider]  guaiFENesin (MUCINEX) 600 MG 12 hr tablet Take 1 tablet (600 mg total) by mouth 2 (two) times daily. 09/15/16   Gouru, Illene Silver, MD  omeprazole (PRILOSEC) 20 MG capsule Take 20 mg by mouth daily.    [provider]  telmisartan (MICARDIS) 80 MG tablet Take 80 mg by mouth daily.    [provider]  traMADol (ULTRAM) 50 MG tablet Take 1 tablet (50 mg total) by mouth every 6 (six) hours as needed for moderate pain. 09/15/16   Nicholes Mango, MD    Allergies Patient has no known allergies.  No family history on file.  Social History Social History  Substance Use Topics  . Smoking status: Never Smoker  . Smokeless tobacco: Never Used  . Alcohol use No    Review of Systems Constitutional: No fever/chills Eyes: Positive vertigo ENT: No sore throat. Cardiovascular: Denies chest pain. Respiratory: Denies shortness of breath. Gastrointestinal: No abdominal pain.  No nausea, no vomiting.  No diarrhea.  No constipation. Genitourinary: Negative for dysuria. Musculoskeletal: Negative for back pain. Skin: Negative for rash. Neurological: Negative for headaches, focal weakness or numbness.   ____________________________________________   PHYSICAL EXAM:  VITAL SIGNS: ED Triage Vitals  Enc Vitals Group     BP  04/28/17 0531 (!) 169/137     Pulse Rate 04/28/17 0531 76     Resp 04/28/17 0531 14     Temp --      Temp src --      SpO2 04/28/17 0531 94 %     Weight 04/28/17 0533 180 lb (81.6 kg)     Height --      Head Circumference --      Peak Flow --      Pain Score --      Pain Loc --      Pain Edu? --      Excl. in [place]? --     Constitutional: Alert and oriented 4 appears extremely uncomfortable holding her eyes tightly closed Eyes: PERRL EOMI. pupils midrange and brisk rightward beating  nystagmus none on the leftward upward or downward gaze no rotational nystagmus Head: Atraumatic. Nose: No congestion/rhinnorhea. Mouth/Throat: No trismus Neck: No stridor.   Cardiovascular: Normal rate, regular rhythm. Grossly normal heart sounds.  Good peripheral circulation. Respiratory: Normal respiratory effort.  No retractions. Lungs CTAB and moving good air Gastrointestinal: Soft nontender Musculoskeletal: No lower extremity edema   Neurologic:  Normal speech and language.  No pronator drift cranial nerves 2-12 intact 5 out of 5 grips biceps triceps plantar flexion dorsiflexion Normal finger-nose-finger No dysdiadochokinesis Skin:  Skin is warm, dry and intact. No rash noted. Psychiatric: Mood and affect are normal. Speech and behavior are normal.    ____________________________________________   DIFFERENTIAL includes but not limited to  Central vertigo, peripheral vertigo  ____________________________________________   LABS (all labs ordered are listed, but only abnormal results are displayed)  Labs Reviewed - No data to display   __________________________________________  EKG  ED ECG REPORT I, Darel Hong, the attending physician, personally viewed and interpreted this ECG.  Date: 04/28/2017 Rate: 76 Rhythm: normal sinus rhythm QRS Axis: normal Intervals: normal ST/T Wave abnormalities: normal Narrative Interpretation: no evidence of acute ischemia _________________________________________  RADIOLOGY  MRI of the brain is pending ____________________________________________   PROCEDURES  Procedure(s) performed: no  Procedures  Critical Care performed: no  Observation: no ____________________________________________   INITIAL IMPRESSION / ASSESSMENT AND PLAN / ED COURSE  Pertinent labs & imaging results that were available during my care of the patient were reviewed by me and considered in my medical decision making (see chart for  details).  The patient has profound vertigo with nystagmus on rightward gaze. No central findings, although it is somewhat unusual that her symptoms are constant and do not go away. Even if this were a stroke, she is outside the window for TPA as she went to bed roughly 8 hours prior to her arrival in the emergency department. I will get an MRI of her brain to evaluate for central etiology.    ----------------------------------------- 5:42 AM on 04/28/2017 -----------------------------------------  The patient now reports that she fell and hit her head roughly 3 weeks ago and has had intermittent episodes of dizziness since then. I will add on aCT scan looking for subdural hematoma.  ____________________________________________   FINAL CLINICAL IMPRESSION(S) / ED DIAGNOSES  Final diagnoses:  None      NEW MEDICATIONS STARTED DURING THIS VISIT:  New Prescriptions   No medications on file     Note:  This document was prepared using Dragon voice recognition software and may include unintentional dictation errors.     Darel Hong, MD 04/28/17 [phone]

## 2017-04-28 NOTE — ED Notes (Signed)
Patient reports she fell 3 weeks ago and hit posterior head.  Patient reports increasing episodes of vertigo since fall.  MD informed.

## 2017-04-28 NOTE — ED Notes (Signed)
Patient transported to CT 

## 2017-04-28 NOTE — ED Notes (Signed)
This RN and Langley Gauss, RN to bedside, notified by MRI that pt had episode of incontinence of urine in the bed. Pt changed and linen replaced. Vicente Males, RN notified at this time.

## 2017-05-08 ENCOUNTER — Ambulatory Visit
Admission: EM | Admit: 2017-05-08 | Discharge: 2017-05-08 | Disposition: A | Discharge status: Home / Self Care | Payer: Medicare Other | Attending: Family Medicine | Admitting: Family Medicine

## 2017-05-08 DIAGNOSIS — J3489 Other specified disorders of nose and nasal sinuses: Secondary | ICD-10-CM

## 2017-05-08 DIAGNOSIS — S0093XD Contusion of unspecified part of head, subsequent encounter: Secondary | ICD-10-CM | POA: Diagnosis not present

## 2017-05-08 DIAGNOSIS — H9122 Sudden idiopathic hearing loss, left ear: Secondary | ICD-10-CM | POA: Diagnosis not present

## 2017-05-08 MED ORDER — PREDNISONE 10 MG (21) PO TBPK: ORAL_TABLET | ORAL | 0 refills | Status: DC

## 2017-05-08 MED ORDER — FEXOFENADINE-PSEUDOEPHED ER 180-240 MG PO TB24: 1.0000 | ORAL_TABLET | Freq: Every day | ORAL | 0 refills | Status: DC

## 2017-05-08 MED ORDER — AMOXICILLIN-POT CLAVULANATE 875-125 MG PO TABS: 1.0000 | ORAL_TABLET | Freq: Two times a day (BID) | ORAL | 0 refills | Status: DC

## 2017-05-08 NOTE — ED Triage Notes (Signed)
Patient complains of "rattling" in her ears. Patient states that she has seen ENT a few times and they only give her hearing test and have not found out the problem. Patient states that she cannot hear anything besides voices sometimes. Patient reports that she has to listen to the tv very loudly and that this problem has been ongoing for months. Patient does not wear hearing aids.

## 2017-05-08 NOTE — ED Provider Notes (Signed)
[facility]    CSN: [id number] Arrival date & time: 05/08/17  1418     History   Chief Complaint Chief Complaint  Patient presents with  . Hearing Loss    HPI Felicia [name] is a 83 y.o. female.   Patient is an 83 year old white female with an unusual history. According to her she was seen several years ago because of hearing loss on the right side. She was not happy with the ENT she sought the time because she states he didn't do anything but gave her a hearing test after seeing her right. She was told that she had hearing loss and damage in the right ear but nothing was offered for treatment.. Some time in the last several weeks she's had increased hearing loss in the left ear. She states she can hear voices but can't really make them out well. She is quite perplexed and quite upset about this turn of events. While she is having trouble hearing out of the left ear she states she has pressure and pain over the right side of her face and that she's not moving air out of her right side of her nostril. She reports this is been plugged up and closed for several weeks now. Apparently along with the pressure she had an episode of vertigo about 10 days ago which did not go away and early the morning she was taken to the ED and a CT scan of her head was done to make sure that she had not suffered a stroke. That was negative however since then about 7 days ago she was walking between 2 cars at work was startled by a dog that was in the car fell and hit her head. EMS came and saw her evaluated her but did not recommend going to the ED at that time. Since then she's got a goose egg on the right side of her head over the occipital area that slightly tender to touch. She denies any loss of consciousness. Not sure how this is related to the hearing loss on the left side.  She's had a history of hypertension, GERD pneumonia of vertigo in the past. She's never smoked. No pertinent family  medical history no known drug allergies and no known surgeries.    The history is provided by the patient. No language interpreter was used.  Ear Fullness  This is a chronic problem. The problem occurs constantly. The problem has been gradually worsening. Pertinent negatives include no chest pain, no abdominal pain, no headaches and no shortness of breath. Nothing aggravates the symptoms. Nothing relieves the symptoms. She has tried nothing for the symptoms. The treatment provided no relief.    Past Medical History:  Diagnosis Date  . GERD (gastroesophageal reflux disease)   . HOH (hard of hearing)   . Hypertension   . Pneumonia   . Vertigo     Patient Active Problem List   Diagnosis Date Noted  . HCAP (healthcare-associated pneumonia) 09/24/2016  . Sepsis ([place]) 09/12/2016  . Community acquired pneumonia 09/12/2016  . Hypoxia 09/12/2016  . Pleuritic chest pain 09/12/2016  . Pneumonia 09/12/2016    History reviewed. No pertinent surgical history.  OB History    No data available       Home Medications    Prior to Admission medications   Medication Sig Start Date End Date Taking? Authorizing Provider  acetaminophen (TYLENOL) 325 MG tablet Take 2 tablets (650 mg total) by mouth every 6 (six) hours  as needed for mild pain (or Fever >/= 101). 09/15/16  Yes Gouru, Illene Silver, MD  albuterol (PROVENTIL HFA;VENTOLIN HFA) 108 (90 Base) MCG/ACT inhaler Inhale 2 puffs into the lungs every 6 (six) hours as needed for wheezing or shortness of breath. 09/15/16  Yes Gouru, Illene Silver, MD  amLODipine (NORVASC) 10 MG tablet Take 10 mg by mouth daily.   Yes [provider]  carvedilol (COREG) 6.25 MG tablet Take 6.25 mg by mouth 2 (two) times daily with a meal.   Yes [provider]  cloNIDine (CATAPRES - DOSED IN MG/24 HR) 0.3 mg/24hr patch Place 0.3 mg onto the skin once a week.   Yes [provider]  Ferrous Sulfate (IRON) 325 (65 Fe) MG TABS Take by mouth.   Yes [provider]  guaiFENesin (MUCINEX) 600 MG 12 hr tablet Take 1 tablet (600 mg total) by mouth 2 (two) times daily. 09/15/16  Yes Gouru, Illene Silver, MD  meclizine (ANTIVERT) 25 MG tablet Take 1 tablet (25 mg total) by mouth 3 (three) times daily as needed for dizziness. 04/28/17  Yes Darel Hong, MD  omeprazole (PRILOSEC) 20 MG capsule Take 20 mg by mouth daily.   Yes [provider]  telmisartan (MICARDIS) 80 MG tablet Take 80 mg by mouth daily.   Yes [provider]  traMADol (ULTRAM) 50 MG tablet Take 1 tablet (50 mg total) by mouth every 6 (six) hours as needed for moderate pain. 09/15/16  Yes Gouru, Illene Silver, MD  amoxicillin-clavulanate (AUGMENTIN) 875-125 MG tablet Take 1 tablet by mouth 2 (two) times daily. 05/08/17   Frederich Cha, MD  fexofenadine-pseudoephedrine (ALLEGRA-D ALLERGY & CONGESTION) 180-240 MG 24 hr tablet Take 1 tablet by mouth daily. 05/08/17   Frederich Cha, MD  predniSONE (STERAPRED UNI-PAK 21 TAB) 10 MG (21) TBPK tablet Sig 6 tablet day 1, 5 tablets day 2, 4 tablets day 3,,3tablets day 4, 2 tablets day 5, 1 tablet day 6 take all tablets orally 05/08/17   Frederich Cha, MD    Family History History reviewed. No pertinent family history.  Social History Social History  Substance Use Topics  . Smoking status: Never Smoker  . Smokeless tobacco: Never Used  . Alcohol use No     Allergies   Patient has no known allergies.   Review of Systems Review of Systems  HENT: Positive for hearing loss, sinus pain and sinus pressure.   Respiratory: Negative for shortness of breath.   Cardiovascular: Negative for chest pain.  Gastrointestinal: Negative for abdominal pain.  Neurological: Positive for dizziness. Negative for headaches.  All other systems reviewed and are negative.    Physical Exam Triage Vital Signs ED Triage Vitals  Enc Vitals Group     BP 05/08/17 1433 (!) 162/59     Pulse Rate 05/08/17 1433 64     Resp 05/08/17 1433 15     Temp 05/08/17 1433  99.1 F (37.3 C)     Temp Source 05/08/17 1433 Oral     SpO2 05/08/17 1433 97 %     Weight 05/08/17 1431 180 lb (81.6 kg)     Height 05/08/17 1431 5\' 8"  (1.727 m)     Head Circumference --      Peak Flow --      Pain Score 05/08/17 1431 3     Pain Loc --      Pain Edu? --      Excl. in [place]? --    No data found.   Updated Vital  Signs BP (!) 162/59 (BP Location: Left Arm) Comment: Patient forgot to take her medication today  Pulse 64   Temp 99.1 F (37.3 C) (Oral)   Resp 15   Ht 5\' 8"  (1.727 m)   Wt 180 lb (81.6 kg)   SpO2 97%   BMI 27.37 kg/m   Visual Acuity Right Eye Distance:   Left Eye Distance:   Bilateral Distance:    Right Eye Near:   Left Eye Near:    Bilateral Near:     Physical Exam  Constitutional: She appears well-developed and well-nourished.  Non-toxic appearance. She does not have a sickly appearance. She does not appear ill. She appears distressed.  Relief white female  HENT:  Head: Normocephalic.    Right Ear: Tympanic membrane, external ear and ear canal normal. Decreased hearing is noted.  Left Ear: Tympanic membrane, external ear and ear canal normal. Decreased hearing is noted.  Nose: Mucosal edema present. Right sinus exhibits maxillary sinus tenderness and frontal sinus tenderness. Left sinus exhibits no maxillary sinus tenderness and no frontal sinus tenderness.  Mouth/Throat: Uvula is midline. No uvula swelling.  Small hematoma present on the right side of her occipital area  Eyes: Pupils are equal, round, and reactive to light. EOM are normal.  Neck: Normal range of motion. Neck supple.  Pulmonary/Chest: Effort normal.  Musculoskeletal: Normal range of motion. She exhibits no tenderness or deformity.  Lymphadenopathy:    She has no cervical adenopathy.  Neurological: She is alert.  Skin: Skin is warm.  Psychiatric: She has a normal mood and affect.  Vitals reviewed.    UC Treatments / Results  Labs (all labs ordered are listed,  but only abnormal results are displayed) Labs Reviewed - No data to display  EKG  EKG Interpretation None       Radiology No results found.  Procedures Procedures (including critical care time)  Medications Ordered in UC Medications - No data to display   Initial Impression / Assessment and Plan / UC Course  I have reviewed the triage vital signs and the nursing notes.  Pertinent labs & imaging results that were available during my care of the patient were reviewed by me and considered in my medical decision making (see chart for details).     Patient with a small hematoma she just had a CT scan before she fell about having further trouble can't really recommend a repeat CT scan at this time..Explained to her that I'm not sure with the pressure in the right side is relevant to the hearing loss in the left ear. However she employs be to give her something treat this because she is so frustrated. She does have an appointment with the ENT in about 2 weeks and will going to place her on Augmentin 875 one tablet twice a day a 6 day course of prednisone and Allegra-D. 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   FEXOFENADINE-PSEUDOEPHEDRINE (ALLEGRA-D ALLERGY & CONGESTION) 180-240 MG 24 HR TABLET    Take 1 tablet by mouth daily.   PREDNISONE (STERAPRED UNI-PAK 21 TAB) 10  MG (21) TBPK TABLET    Sig 6 tablet day 1, 5 tablets day 2, 4 tablets day 3,,3tablets day 4, 2 tablets day 5, 1 tablet day 6 take all tablets orally   Note: This dictation was prepared with Dragon dictation along with  smaller phrase technology. Any transcriptional errors that result from this process are unintentional. Controlled Substance Prescriptions [place] Controlled Substance Registry consulted? Not Applicable   Frederich Cha, MD 05/08/17 1537

## 2018-01-28 DIAGNOSIS — I119 Hypertensive heart disease without heart failure: Secondary | ICD-10-CM | POA: Insufficient documentation

## 2019-02-24 DIAGNOSIS — R42 Dizziness and giddiness: Secondary | ICD-10-CM | POA: Insufficient documentation

## 2019-08-05 ENCOUNTER — Other Ambulatory Visit: Payer: Self-pay | Admitting: Nephrology

## 2019-08-05 DIAGNOSIS — N184 Chronic kidney disease, stage 4 (severe): Secondary | ICD-10-CM

## 2019-08-16 ENCOUNTER — Ambulatory Visit: Payer: Medicare Other | Attending: Nephrology

## 2019-09-01 DIAGNOSIS — M19049 Primary osteoarthritis, unspecified hand: Secondary | ICD-10-CM | POA: Insufficient documentation

## 2019-09-01 DIAGNOSIS — M2141 Flat foot [pes planus] (acquired), right foot: Secondary | ICD-10-CM | POA: Insufficient documentation

## 2019-09-01 DIAGNOSIS — R768 Other specified abnormal immunological findings in serum: Secondary | ICD-10-CM | POA: Insufficient documentation

## 2019-09-01 DIAGNOSIS — N184 Chronic kidney disease, stage 4 (severe): Secondary | ICD-10-CM | POA: Insufficient documentation

## 2019-09-16 NOTE — Progress Notes (Signed)
[facility]  [address], [address] [place], [place] [address] Phone: [phone]  Fax: [phone]   Clinic Day:  09/17/2019  Referring physician: Anthonette Legato, MD  Chief Complaint: Felicia [name] is a 86 y.o. female with anemia in chronic kidney disease who is referred in consultation by Dr Anthonette Legato for assessment and management.   HPI:  The patient was seen for initial consultation of stage IV kidney disease on 08/05/2019 by Dr. Holley Raring.  CrCl was 27 ml/min. She described a longstanding history of NSAID use in the form of Bufferin.  Work-up revealed a hematocrit of 27.4, hemoglobin 9.2, MCV 91.3, platelets 141,000, white count 7100 with an [place] of 3799.  Monocyte count was 1299.  Iron saturation was 34% with a TIBC of 194.  BUN was 40 with a creatinine of 2.15 (CrCl 23 ml/min).  Phosphorus was 4.4 (elevated). Calcium was 8.5 with an albumen of 3.8.  PTH was 102.  ANA was + with a titer of 1:320 with cytoplasmic pattern and a dsDNA of 11 (+).    SPEP revealed a poorly defined band of restricted protein mobility in the gammaglobulin region.  It was felt unlikely to represent a monoclonal protein, however immunofixation could be considered  She was referred to rheumatology for a + ANA and was seen by Dr Marlowe Sax on 09/01/2019.  Additional testing was ordered.  She was not felt to have lupus.  She has a follow-up in 6 months.  She was seen by Dr Holley Raring on 09/02/2019.  She was felt to have stage IV chronic kidney disease, anemia of chronic kidney disease, and secondary hyperparathyroidism.  Hypertension was well managed.  Iron supplements were to be maintained.  CBC followed 09/03/2014:  Hematocrit 25.1, hemoglobin 8.2, MCV 83.0, platelets 158,000, WBC 5600.  09/12/2016:  Hematocrit 30.6, hemoglobin 10.4, MCV 82.4, platelets 102,000, WBC 7700.  Monocyte count 1200. 09/13/2016:  Hematocrit 28.2, hemoglobin 9.4, MCV 82.5, platelets  93,000, WBC 16300.  09/14/2016:  Hematocrit 28.1, hemoglobin 9.4, MCV 81.7, platelets 101,000, WBC 12300.  Monocyte count 800.  09/24/2016:  Hematocrit 24.8, hemoglobin 8.5, MCV 80.1, platelets 162,000, WBC 11400.  Monocyte count 1500. 09/26/2016:  Hematocrit 23.8, hemoglobin 8.2, MCV 80.2, platelets 159,000, WBC 4800.  10/31/2017:  Hematocrit 28.6, hemoglobin 9.3, MCV 90.2, platelets ----------, WBC 5500.    Monocyte count 1000. 05/08/2018:  Hematocrit 29.0, hemoglobin 9.7, MCV 88.1, platelets 149,000, WBC 7200.    Mixed count 1100. 06/22/2019:  Hematocrit 28.7, hemoglobin 9.5, MCV 89.1, platelets 130,000, WBC 6200.    Mixed count 800. 08/05/2019:  Hematocrit 27.4, hemoglobin 9.2, MCV 91.3, platelets 141,000, WBC 7100.    Monocyte count 1299.  Notes indicate was admitted to [facility] from 09/12/2016 - 09/16/2016 with community acquired pneumonia of the left lower lobe.  She had acute on chronic renal insufficiency.  Creatine ranged from 1.38 - 1.86.  She was admitted to [facility] from 09/23/2016 - 09/27/2016 with health care associated pneumonia.  She had stage III chronic kidney disease.  Creatinine ranged between 1.22 - 2.35.  Symptomatically, she feels fatigued.  However, she is having a good day today. She notices black stools but believes it is due to taking oral iron. She doesn't have prescription for oral iron. She has had black stool for a while. She never had black stool before oral iron. She started taking iron 1 year ago. She is unsure why she is here, but has been told that she has anemia.  She was unaware  she was anemic up until 3 days ago.  She has never had transfusion.  She has never noticed blood in her stool. She started taking oral iron just for her own health and to give herself energy. She takes 1 supplement a day. She believes it has helped. She takes a B12 vitamin as well. She takes an antacid to help with reflux. She believes she takes omeprazole. She has been taking it roughly for 6  years.  She stopped using Bufferin 10-15 years ago.   She eats meat roughly 7 times a week. She eats salad. She denies ice pica but craves sweets. She has restless legs at times; she describes it as twitching sensation.   Her father had bladder cancer but no other family history of cancer or blood disorders.    Past Medical History:  Diagnosis Date  . GERD (gastroesophageal reflux disease)   . HOH (hard of hearing)   . Hypertension   . Pneumonia   . Vertigo     Past Surgical History:  Procedure Laterality Date  . APPENDECTOMY  1939  . TONSILLECTOMY  1939    Family History  Problem Relation Age of Onset  . Heart disease Mother   . Goiter Mother   . Bladder Cancer Father 52    Social History:  reports that she has never smoked. She has never used smokeless tobacco. She reports that she does not drink alcohol or use drugs. She has had no exposure to any radiation or toxins. Her husband started [facility] in [place].  After he passed, she went to work for [employer] for 23 years. 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 She lives by herself with 2 cats named brother and sister. She lives in [place]. She has 1 son who lives in [place]. The patient is alone today.  Allergies: No Known Allergies  Current Medications: Current Outpatient Medications  Medication Sig Dispense Refill  . albuterol (PROVENTIL HFA;VENTOLIN HFA) 108 (90 Base) MCG/ACT inhaler Inhale 2 puffs into the lungs every 6 (six) hours as needed for wheezing or shortness of breath. 1 Inhaler 0  . amLODipine (NORVASC) 10 MG tablet Take 10 mg by mouth daily.    Marland Kitchen aspirin EC 81 MG tablet Take 81 mg by mouth as needed.    . carvedilol (COREG) 6.25 MG tablet Take 6.25 mg by mouth 2 (two) times daily with a meal.    . cloNIDine (CATAPRES - DOSED IN MG/24 HR) 0.3 mg/24hr patch Place 0.3 mg onto the skin once a week.    . cyanocobalamin 1000 MCG tablet Take 1,000 mcg by  mouth daily.    . Ferrous Sulfate (IRON) 325 (65 Fe) MG TABS Take by mouth.    . meclizine (ANTIVERT) 25 MG tablet Take 1 tablet (25 mg total) by mouth 3 (three) times daily as needed for dizziness. 30 tablet 0  . omeprazole (PRILOSEC) 20 MG capsule Take 20 mg by mouth daily.    Marland Kitchen telmisartan (MICARDIS) 80 MG tablet Take 80 mg by mouth daily.     No current facility-administered medications for this visit.    Review of Systems  Constitutional: Positive for weight loss (6 lbs within 3-4 months). Negative for chills, diaphoresis, fever and malaise/fatigue.  HENT: Negative.  Negative for congestion, ear pain, hearing loss, nosebleeds, sinus pain and sore throat.   Eyes: Negative.  Negative for blurred vision, double vision and photophobia.  S/p cataract surgery.  Respiratory: Negative.  Negative for cough, hemoptysis and sputum production.   Cardiovascular: Negative.  Negative for chest pain, palpitations, leg swelling and PND.  Gastrointestinal: Positive for blood in stool. Negative for abdominal pain, diarrhea, nausea and vomiting.       Reflux controlled with omeprazole.  Genitourinary: Negative for frequency, hematuria and urgency.       Incontinence, wears adult diaper.  Musculoskeletal: Positive for joint pain. Negative for neck pain.       Arthritis in hands.  Skin: Negative.  Negative for rash.  Neurological: Positive for sensory change (restless legs; twitching sensation) and weakness (generalized; needs cane to help walk). Negative for dizziness, focal weakness and headaches.  Endo/Heme/Allergies: Negative.  Does not bruise/bleed easily.  Psychiatric/Behavioral: Negative for depression and memory loss. The patient is not nervous/anxious and does not have insomnia.    Performance status (ECOG): 1  Vitals Blood pressure (!) 141/57, pulse 63, temperature 98.7 F (37.1 C), temperature source Tympanic, weight 185 lb 3 oz (84 kg), SpO2 97 %.   Physical Exam  Constitutional:  She is oriented to person, place, and time. She appears well-developed and well-nourished.  Elderly woman sitting comfortably in the exam room in no acute distress.  She has a cane by her side.  HENT:  Head: Normocephalic and atraumatic.  Mouth/Throat: Oropharynx is clear and moist. No oropharyngeal exudate.  Pearline Cables styled hair.  Dentures.  Mask.  Eyes: Pupils are equal, round, and reactive to light. Conjunctivae and EOM are normal. No scleral icterus.  Blue eyes s/p cataract surgery.  Neck: No JVD present.  Cardiovascular: Normal rate. Exam reveals no gallop.  Murmur heard. Pulmonary/Chest: Effort normal and breath sounds normal. No respiratory distress. She has no wheezes. She has no rales.  Abdominal: Soft. Bowel sounds are normal. She exhibits no distension and no mass. There is no abdominal tenderness. There is no rebound and no guarding.  Musculoskeletal:        General: No tenderness or edema. Normal range of motion.     Cervical back: Normal range of motion and neck supple.  Lymphadenopathy:       Head (right side): No preauricular, no posterior auricular and no occipital adenopathy present.       Head (left side): No preauricular, no posterior auricular and no occipital adenopathy present.    She has no cervical adenopathy.    She has no axillary adenopathy.       Right: No inguinal and no supraclavicular adenopathy present.       Left: No inguinal and no supraclavicular adenopathy present.  Neurological: She is alert and oriented to person, place, and time.  Skin: Skin is warm, dry and intact. No petechiae and no rash noted. She is not diaphoretic. No cyanosis or erythema. Nails show no clubbing.  Psychiatric: She has a normal mood and affect. Her behavior is normal. Judgment and thought content normal.  Nursing note and vitals reviewed.   No visits with results within 3 Day(s) from this visit.  Latest known visit with results is:  Admission on 09/23/2016, Discharged on  09/27/2016  Component Date Value Ref Range Status  . WBC 09/24/2016 11.4* 3.6 - 11.0 K/uL Final  . RBC 09/24/2016 3.10* 3.80 - 5.20 MIL/uL Final  . Hemoglobin 09/24/2016 8.5* 12.0 - 16.0 g/dL Final  . HCT 09/24/2016 24.8* 35.0 - 47.0 % Final  . MCV 09/24/2016 80.1  80.0 - 100.0 fL Final  . MCH 09/24/2016 27.4  26.0 -  34.0 pg Final  . MCHC 09/24/2016 34.2  32.0 - 36.0 g/dL Final  . RDW 09/24/2016 15.9* 11.5 - 14.5 % Final  . Platelets 09/24/2016 162  150 - 440 K/uL Final  . Neutrophils Relative % 09/24/2016 79  % Final  . Neutro Abs 09/24/2016 9.0* 1.4 - 6.5 K/uL Final  . Lymphocytes Relative 09/24/2016 7  % Final  . Lymphs Abs 09/24/2016 0.8* 1.0 - 3.6 K/uL Final  . Monocytes Relative 09/24/2016 14  % Final  . Monocytes Absolute 09/24/2016 1.5* 0.2 - 0.9 K/uL Final  . Eosinophils Relative 09/24/2016 0  % Final  . Eosinophils Absolute 09/24/2016 0.0  0 - 0.7 K/uL Final  . Basophils Relative 09/24/2016 0  % Final  . Basophils Absolute 09/24/2016 0.0  0 - 0.1 K/uL Final  . Sodium 09/24/2016 133* 135 - 145 mmol/L Final  . Potassium 09/24/2016 4.7  3.5 - 5.1 mmol/L Final  . Chloride 09/24/2016 99* 101 - 111 mmol/L Final  . CO2 09/24/2016 25  22 - 32 mmol/L Final  . Glucose, Bld 09/24/2016 122* 65 - 99 mg/dL Final  . BUN 09/24/2016 50* 6 - 20 mg/dL Final  . Creatinine, Ser 09/24/2016 2.35* 0.44 - 1.00 mg/dL Final  . Calcium 09/24/2016 8.2* 8.9 - 10.3 mg/dL Final  . GFR calc non Af Amer 09/24/2016 18* >60 mL/min Final  . GFR calc Af Amer 09/24/2016 21* >60 mL/min Final   Comment: (NOTE) The eGFR has been calculated using the CKD EPI equation. This calculation has not been validated in all clinical situations. eGFR's persistently <60 mL/min signify possible Chronic Kidney Disease.   . Anion gap 09/24/2016 9  5 - 15 Final  . Color, Urine 09/24/2016 YELLOW* YELLOW Final  . APPearance 09/24/2016 CLEAR* CLEAR Final  . Specific Gravity, Urine 09/24/2016 1.010  1.005 - 1.030 Final  . pH  09/24/2016 5.0  5.0 - 8.0 Final  . Glucose, UA 09/24/2016 NEGATIVE  NEGATIVE mg/dL Final  . Hgb urine dipstick 09/24/2016 SMALL* NEGATIVE Final  . Bilirubin Urine 09/24/2016 NEGATIVE  NEGATIVE Final  . Ketones, ur 09/24/2016 NEGATIVE  NEGATIVE mg/dL Final  . Protein, ur 09/24/2016 NEGATIVE  NEGATIVE mg/dL Final  . Nitrite 09/24/2016 NEGATIVE  NEGATIVE Final  . Leukocytes, UA 09/24/2016 NEGATIVE  NEGATIVE Final  . RBC / HPF 09/24/2016 0-5  0 - 5 RBC/hpf Final  . WBC, UA 09/24/2016 0-5  0 - 5 WBC/hpf Final  . Bacteria, UA 09/24/2016 RARE* NONE SEEN Final  . Squamous Epithelial / LPF 09/24/2016 0-5* NONE SEEN Final  . Mucus 09/24/2016 PRESENT   Final  . Troponin I 09/24/2016 0.04* <0.03 ng/mL Final   Comment: CRITICAL RESULT CALLED TO, READ BACK BY AND VERIFIED WITH RACHEL HAYDEN ON 09/24/16 AT 0059 BY TLB   . Influenza A By PCR 09/24/2016 NEGATIVE  NEGATIVE Final  . Influenza B By PCR 09/24/2016 NEGATIVE  NEGATIVE Final   Comment: (NOTE) The Xpert Xpress Flu assay is intended as an aid in the diagnosis of  influenza and should not be used as a sole basis for treatment.  This  assay is FDA approved for nasopharyngeal swab specimens only. Nasal  washings and aspirates are unacceptable for Xpert Xpress Flu testing.   . Sodium 09/24/2016 133* 135 - 145 mmol/L Final  . Potassium 09/24/2016 4.2  3.5 - 5.1 mmol/L Final  . Chloride 09/24/2016 98* 101 - 111 mmol/L Final  . CO2 09/24/2016 26  22 - 32 mmol/L Final  . Glucose,  Bld 09/24/2016 131* 65 - 99 mg/dL Final  . BUN 09/24/2016 45* 6 - 20 mg/dL Final  . Creatinine, Ser 09/24/2016 2.14* 0.44 - 1.00 mg/dL Final  . Calcium 09/24/2016 8.1* 8.9 - 10.3 mg/dL Final  . GFR calc non Af Amer 09/24/2016 20* >60 mL/min Final  . GFR calc Af Amer 09/24/2016 23* >60 mL/min Final   Comment: (NOTE) The eGFR has been calculated using the CKD EPI equation. This calculation has not been validated in all clinical situations. eGFR's persistently <60 mL/min  signify possible Chronic Kidney Disease.   . Anion gap 09/24/2016 9  5 - 15 Final  . WBC 09/24/2016 10.4  3.6 - 11.0 K/uL Final  . RBC 09/24/2016 2.87* 3.80 - 5.20 MIL/uL Final  . Hemoglobin 09/24/2016 8.0* 12.0 - 16.0 g/dL Final  . HCT 09/24/2016 23.4* 35.0 - 47.0 % Final  . MCV 09/24/2016 81.4  80.0 - 100.0 fL Final  . MCH 09/24/2016 27.9  26.0 - 34.0 pg Final  . MCHC 09/24/2016 34.3  32.0 - 36.0 g/dL Final  . RDW 09/24/2016 15.9* 11.5 - 14.5 % Final  . Platelets 09/24/2016 145* 150 - 440 K/uL Final  . Troponin I 09/24/2016 0.03* <0.03 ng/mL Final   CRITICAL VALUE NOTED. VALUE IS CONSISTENT WITH PREVIOUSLY REPORTED/CALLED VALUE...[place]  . Troponin I 09/24/2016 0.03* <0.03 ng/mL Final   CRITICAL VALUE NOTED. VALUE IS CONSISTENT WITH PREVIOUSLY REPORTED/CALLED VALUE MNS  . Troponin I 09/24/2016 0.04* <0.03 ng/mL Final   CRITICAL VALUE NOTED. VALUE IS CONSISTENT WITH PREVIOUSLY REPORTED/CALLED VALUE SN[facility]  . MRSA by PCR 09/24/2016 NEGATIVE  NEGATIVE Final   Comment:        The GeneXpert MRSA Assay (FDA approved for NASAL specimens only), is one component of a comprehensive MRSA colonization surveillance program. It is not intended to diagnose MRSA infection nor to guide or monitor treatment for MRSA infections.   . Sodium 09/25/2016 134* 135 - 145 mmol/L Final  . Potassium 09/25/2016 3.7  3.5 - 5.1 mmol/L Final  . Chloride 09/25/2016 102  101 - 111 mmol/L Final  . CO2 09/25/2016 26  22 - 32 mmol/L Final  . Glucose, Bld 09/25/2016 99  65 - 99 mg/dL Final  . BUN 09/25/2016 36* 6 - 20 mg/dL Final  . Creatinine, Ser 09/25/2016 1.54* 0.44 - 1.00 mg/dL Final  . Calcium 09/25/2016 8.1* 8.9 - 10.3 mg/dL Final  . GFR calc non Af Amer 09/25/2016 30* >60 mL/min Final  . GFR calc Af Amer 09/25/2016 34* >60 mL/min Final   Comment: (NOTE) The eGFR has been calculated using the CKD EPI equation. This calculation has not been validated in all clinical situations. eGFR's persistently <60 mL/min  signify possible Chronic Kidney Disease.   . Anion gap 09/25/2016 6  5 - 15 Final  . Specimen Description 09/25/2016 Expect. Sput   Final  . Special Requests 09/25/2016 Normal   Final  . Sputum evaluation 09/25/2016    Final                   Value:Sputum specimen not acceptable for testing.  Please recollect.   SPOKE TO [facility] SIMSER 09/25/16 @ 1905  MLK   . Report Status 09/25/2016 09/25/2016 FINAL   Final  . Specimen Description 09/25/2016 BLOOD  R AC    Final  . Special Requests 09/25/2016 BOTTLES DRAWN AEROBIC AND ANAEROBIC  AER 8 ML ANA 11 ML   Final  . Culture 09/25/2016 NO GROWTH 5 DAYS  Final  . Report Status 09/25/2016 09/30/2016 FINAL   Final  . Specimen Description 09/25/2016 BLOOD  L HAND   Final  . Special Requests 09/25/2016 BOTTLES DRAWN AEROBIC AND ANAEROBIC   AER 8 ML ANA 4 ML    Final  . Culture 09/25/2016 NO GROWTH 5 DAYS   Final  . Report Status 09/25/2016 09/30/2016 FINAL   Final  . WBC 09/26/2016 4.8  3.6 - 11.0 K/uL Final  . RBC 09/26/2016 2.96* 3.80 - 5.20 MIL/uL Final  . Hemoglobin 09/26/2016 8.2* 12.0 - 16.0 g/dL Final  . HCT 09/26/2016 23.8* 35.0 - 47.0 % Final  . MCV 09/26/2016 80.2  80.0 - 100.0 fL Final  . MCH 09/26/2016 27.8  26.0 - 34.0 pg Final  . MCHC 09/26/2016 34.7  32.0 - 36.0 g/dL Final  . RDW 09/26/2016 15.6* 11.5 - 14.5 % Final  . Platelets 09/26/2016 159  150 - 440 K/uL Final  . Sodium 09/26/2016 137  135 - 145 mmol/L Final  . Potassium 09/26/2016 3.6  3.5 - 5.1 mmol/L Final  . Chloride 09/26/2016 103  101 - 111 mmol/L Final  . CO2 09/26/2016 27  22 - 32 mmol/L Final  . Glucose, Bld 09/26/2016 89  65 - 99 mg/dL Final  . BUN 09/26/2016 25* 6 - 20 mg/dL Final  . Creatinine, Ser 09/26/2016 1.22* 0.44 - 1.00 mg/dL Final  . Calcium 09/26/2016 8.5* 8.9 - 10.3 mg/dL Final  . GFR calc non Af Amer 09/26/2016 39* >60 mL/min Final  . GFR calc Af Amer 09/26/2016 45* >60 mL/min Final   Comment: (NOTE) The eGFR has been calculated using the CKD EPI  equation. This calculation has not been validated in all clinical situations. eGFR's persistently <60 mL/min signify possible Chronic Kidney Disease.   . Anion gap 09/26/2016 7  5 - 15 Final    Assessment:  Felicia [name] is a 86 y.o. female with anemia of chronic kidney disease.  Hemoglobin has ranged between 8.2 - 10.4 over the past 4 years.  Diet is good.  Work-up on 08/05/2019 revealed a hematocrit of 27.4, hemoglobin 9.2, MCV 91.3, platelets 141,000, white count 7100 with an [place] of 3799.  Monocyte count was 1299.  Iron saturation was 34% with a TIBC of 194.  Creatinine was 2.15 (CrCl 23 ml/min).  Phosphorus was 4.4 (elevated). Calcium was 8.5 with an albumen of 3.8.  PTH was 102.  ANA was + with a titer of 1:320 with cytoplasmic pattern and a dsDNA of 11 (+).  SPEP revealed a poorly defined band of restricted protein mobility in the gammaglobulin region.  She has stage IV chronic kidney disease.  Symptomatically, she feels fatigued.  She denies any bleeding.  Exam is unremarkable.  Plan: 1.   Labs today:  CBC with diff, ferritin, myeloma panel, FLCA. 2.   Peripheral smear for path review. 3.   Normocytic anemia  Etiology felt secondary to anemia of chronic renal disease.  Discuss ESAs.   Potential side effects reviewed.   Information provided.  Preauth Retacrit. 4.   Poorly defined band of restricted protein mobility  Doubt a monoclonal gammopathy.  Check myeloma panel and FLCA. 5.   Monocytosis  Patient appears to have a recurrent monocytosis.  Consideration of CMML with monocytosis (>= 1000; >= 10% WBC) lasting > 3 months.  Review peripheral smear.  Consider additional work-up. 6.  RTC in 2 weeks for MD assessment, labs (Hgb/HCT), review of work-up, and +/- Retacrit.  I discussed  the assessment and treatment plan with the patient.  The patient was provided an opportunity to ask questions and all were answered.  The patient agreed with the plan and demonstrated an  understanding of the instructions.  The patient was advised to call back if the symptoms worsen or if the condition fails to improve as anticipated.  I provided 32 minutes (1:38 PM - 2:10 PM) of face-to-face time during this this encounter and > 50% was spent counseling as documented under my assessment and plan.  An additional 10 minutes was spent reviewing clinical notes and labs.   Vangie Henthorn C. Mike Gip, MD, PhD    09/17/2019, 2:10 PM  I, Samul Dada, am acting as scribe for [employer]. Mike Gip, MD, PhD.  I, Anaclara Acklin C. Mike Gip, MD, have reviewed the above documentation for accuracy and completeness, and I agree with the above.

## 2019-09-17 ENCOUNTER — Inpatient Hospital Stay: Payer: Medicare Other

## 2019-09-17 ENCOUNTER — Inpatient Hospital Stay: Payer: Medicare Other | Attending: Hematology and Oncology | Admitting: Hematology and Oncology

## 2019-09-17 ENCOUNTER — Other Ambulatory Visit: Payer: Self-pay

## 2019-09-17 ENCOUNTER — Encounter: Payer: Self-pay | Admitting: Hematology and Oncology

## 2019-09-17 VITALS — BP 141/57 | HR 63 | Temp 98.7°F | Wt 185.2 lb

## 2019-09-17 DIAGNOSIS — R778 Other specified abnormalities of plasma proteins: Secondary | ICD-10-CM | POA: Diagnosis not present

## 2019-09-17 DIAGNOSIS — D631 Anemia in chronic kidney disease: Secondary | ICD-10-CM | POA: Diagnosis present

## 2019-09-17 DIAGNOSIS — N184 Chronic kidney disease, stage 4 (severe): Secondary | ICD-10-CM | POA: Insufficient documentation

## 2019-09-17 DIAGNOSIS — D649 Anemia, unspecified: Secondary | ICD-10-CM | POA: Insufficient documentation

## 2019-09-17 DIAGNOSIS — D72821 Monocytosis (symptomatic): Secondary | ICD-10-CM | POA: Diagnosis not present

## 2019-09-17 DIAGNOSIS — D638 Anemia in other chronic diseases classified elsewhere: Secondary | ICD-10-CM | POA: Insufficient documentation

## 2019-09-17 DIAGNOSIS — E782 Mixed hyperlipidemia: Secondary | ICD-10-CM | POA: Insufficient documentation

## 2019-09-17 DIAGNOSIS — Z8052 Family history of malignant neoplasm of bladder: Secondary | ICD-10-CM | POA: Insufficient documentation

## 2019-09-17 DIAGNOSIS — I129 Hypertensive chronic kidney disease with stage 1 through stage 4 chronic kidney disease, or unspecified chronic kidney disease: Secondary | ICD-10-CM | POA: Diagnosis not present

## 2019-09-17 DIAGNOSIS — I739 Peripheral vascular disease, unspecified: Secondary | ICD-10-CM | POA: Insufficient documentation

## 2019-09-17 LAB — CBC WITH DIFFERENTIAL/PLATELET
Abs Immature Granulocytes: 0.42 10*3/uL — ABNORMAL HIGH (ref 0.00–0.07)
Basophils Absolute: 0 10*3/uL (ref 0.0–0.1)
Basophils Relative: 0 %
Eosinophils Absolute: 0 10*3/uL (ref 0.0–0.5)
Eosinophils Relative: 1 %
HCT: 26.7 % — ABNORMAL LOW (ref 36.0–46.0)
Hemoglobin: 8.9 g/dL — ABNORMAL LOW (ref 12.0–15.0)
Immature Granulocytes: 5 %
Lymphocytes Relative: 28 %
Lymphs Abs: 2.2 10*3/uL (ref 0.7–4.0)
MCH: 30.6 pg (ref 26.0–34.0)
MCHC: 33.3 g/dL (ref 30.0–36.0)
MCV: 91.8 fL (ref 80.0–100.0)
Monocytes Absolute: 1.6 10*3/uL — ABNORMAL HIGH (ref 0.1–1.0)
Monocytes Relative: 20 %
Neutro Abs: 3.7 10*3/uL (ref 1.7–7.7)
Neutrophils Relative %: 46 %
Platelets: 130 10*3/uL — ABNORMAL LOW (ref 150–400)
RBC: 2.91 MIL/uL — ABNORMAL LOW (ref 3.87–5.11)
RDW: 15 % (ref 11.5–15.5)
WBC: 8 10*3/uL (ref 4.0–10.5)
nRBC: 0 % (ref 0.0–0.2)

## 2019-09-17 LAB — FOLATE: Folate: 12.1 ng/mL (ref >5.9)

## 2019-09-17 LAB — FERRITIN: Ferritin: 748 ng/mL — ABNORMAL HIGH (ref 11–307)

## 2019-09-17 NOTE — Progress Notes (Signed)
Patient stated that she had been doing well. Patient is here today to establish care for anemia. Patient had noticed that her stools are black.

## 2019-09-17 NOTE — Patient Instructions (Signed)

## 2019-09-18 LAB — VITAMIN B12: Vitamin B-12: 700 pg/mL (ref 180–914)

## 2019-09-20 LAB — KAPPA/LAMBDA LIGHT CHAINS
Kappa free light chain: 139.2 mg/L — ABNORMAL HIGH (ref 3.3–19.4)
Kappa, lambda light chain ratio: 1.75 — ABNORMAL HIGH (ref 0.26–1.65)
Lambda free light chains: 79.5 mg/L — ABNORMAL HIGH (ref 5.7–26.3)

## 2019-09-20 LAB — PATHOLOGIST SMEAR REVIEW

## 2019-09-22 LAB — MULTIPLE MYELOMA PANEL, SERUM
Albumin SerPl Elph-Mcnc: 4 g/dL (ref 2.9–4.4)
Albumin/Glob SerPl: 1.3 (ref 0.7–1.7)
Alpha 1: 0.2 g/dL (ref 0.0–0.4)
Alpha2 Glob SerPl Elph-Mcnc: 0.8 g/dL (ref 0.4–1.0)
B-Globulin SerPl Elph-Mcnc: 0.9 g/dL (ref 0.7–1.3)
Gamma Glob SerPl Elph-Mcnc: 1.4 g/dL (ref 0.4–1.8)
Globulin, Total: 3.3 g/dL (ref 2.2–3.9)
IgA: 347 mg/dL (ref 64–422)
IgG (Immunoglobin G), Serum: 1453 mg/dL (ref 586–1602)
IgM (Immunoglobulin M), Srm: 147 mg/dL (ref 26–217)
Total Protein ELP: 7.3 g/dL (ref 6.0–8.5)

## 2019-09-28 ENCOUNTER — Other Ambulatory Visit: Payer: Self-pay

## 2019-09-28 DIAGNOSIS — D649 Anemia, unspecified: Secondary | ICD-10-CM

## 2019-09-29 ENCOUNTER — Other Ambulatory Visit: Payer: Self-pay

## 2019-09-29 ENCOUNTER — Encounter: Payer: Self-pay | Admitting: Hematology and Oncology

## 2019-09-29 NOTE — Progress Notes (Signed)
No new changes noted today. The patient name and DOB has been verified by phone today. The patient b/p is running a little low today 113/58, the patient reports no dizziness or lightheaded.

## 2019-09-29 NOTE — Progress Notes (Signed)
[facility]  [address], [address] [place], [place] [address] Phone: [phone]  Fax: [phone]   Clinic Day:  09/30/2019  Referring physician: Sofie Hartigan, MD  Chief Complaint: Felicia [name] is a 86 y.o. female with anemia in chronic kidney disease who is seen for review of work-up and initiation of Retacrit.   HPI:  The patient was last seen in the hematology clinic on 09/17/2019 for initial consultation.  At that time, she felt fatigued.  She denied any bleeding.  Exam was unremarkable.  She was felt to have a normocytic anemia secondary to anemia of chronic renal disease.  We discussed ESA's including potential side effects.  Information was provided.  She was preauthorized for Retacrit,  Work-up on 09/17/2019 revealed a hematocrit 26.7, hemoglobin 8.9, MCV 91.8, platelets 130,000, WBC 800, ANC 3700.  Ferritin was 748.  B12 was 700 and folate 12.1. M-spike 0.  Kappa free light chain were 139.2, lambda free light chain 79.5, and ratio 1.75 (0.26-1.75). Immunoglobulin levels were normal.    Peripheral smear revealed a normocytic anemia with mild thrombocytopenia.  The morphology of the RBCs, WBCs, and platelets were within normal limits.  There was no evidence of circulating blasts.  During the interim, she has done well.  Her weight is up 2 lbs. She has no blood in her stool. She still has black stool due to iron supplements.   Past Medical History:  Diagnosis Date  . GERD (gastroesophageal reflux disease)   . HOH (hard of hearing)   . Hypertension   . Pneumonia   . Vertigo     Past Surgical History:  Procedure Laterality Date  . APPENDECTOMY  1939  . TONSILLECTOMY  1939    Family History  Problem Relation Age of Onset  . Heart disease Mother   . Goiter Mother   . Bladder Cancer Father 43    Social History:  reports that she has never smoked. She has never used smokeless tobacco. She reports that she does not drink alcohol  or use drugs. She has had no exposure to any radiation or toxins. Her husband started [employer] [facility] in [place]. After he passed she went to work for [employer] for 23 years. 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 She lives by herself with 2 cats named brother and sister. She lives in [place]. She has 1 son who lives in [place]. The patient is alone today.  Allergies: No Known Allergies  Current Medications: Current Outpatient Medications  Medication Sig Dispense Refill  . albuterol (PROVENTIL HFA;VENTOLIN HFA) 108 (90 Base) MCG/ACT inhaler Inhale 2 puffs into the lungs every 6 (six) hours as needed for wheezing or shortness of breath. 1 Inhaler 0  . amLODipine (NORVASC) 10 MG tablet Take 10 mg by mouth daily.    Marland Kitchen aspirin EC 81 MG tablet Take 81 mg by mouth as needed.    . carvedilol (COREG) 6.25 MG tablet Take 6.25 mg by mouth 2 (two) times daily with a meal.    . cloNIDine (CATAPRES - DOSED IN MG/24 HR) 0.3 mg/24hr patch Place 0.3 mg onto the skin once a week.    . cyanocobalamin 1000 MCG tablet Take 1,000 mcg by mouth daily.    . Ferrous Sulfate (IRON) 325 (65 Fe) MG TABS Take by mouth.    . meclizine (ANTIVERT) 25 MG tablet Take 1 tablet (25 mg total) by mouth 3 (  three) times daily as needed for dizziness. 30 tablet 0  . omeprazole (PRILOSEC) 20 MG capsule Take 20 mg by mouth daily.    Marland Kitchen telmisartan (MICARDIS) 80 MG tablet Take 80 mg by mouth daily.     No current facility-administered medications for this visit.    Review of Systems  Constitutional: Negative.  Negative for chills, diaphoresis, fever, malaise/fatigue and weight loss (up 2 lbs).       Doing well.  HENT: Negative for congestion, ear pain, hearing loss, nosebleeds, sinus pain and sore throat.   Eyes: Negative.  Negative for blurred vision and double vision.  Respiratory: Negative.  Negative for cough, hemoptysis, sputum production and shortness of breath.     Cardiovascular: Negative.  Negative for chest pain, palpitations, leg swelling and PND.  Gastrointestinal: Negative for abdominal pain, blood in stool, constipation, diarrhea, melena, nausea and vomiting.       Reflux controlled with omeprazole.  Dark stool on oral iron.  Genitourinary: Negative for frequency, hematuria and urgency.       Incontinence; wears adult diapers.  Musculoskeletal: Positive for joint pain (hands). Negative for neck pain.  Skin: Negative.  Negative for rash.  Neurological: Positive for sensory change (restless legs; twitching sensation) and weakness (generalized; needs cane to help walk). Negative for dizziness, speech change, focal weakness and headaches.  Endo/Heme/Allergies: Negative.  Does not bruise/bleed easily.  Psychiatric/Behavioral: Negative.  Negative for depression and memory loss. The patient is not nervous/anxious and does not have insomnia.    Performance status (ECOG): 1  Vitals Blood pressure (!) 113/58, pulse 67, temperature 98.9 F (37.2 C), temperature source Tympanic, resp. rate 18, height 5\' 9"  (1.753 m), weight 187 lb 4.5 oz (84.9 kg), SpO2 98 %.   Physical Exam  Constitutional: She is oriented to person, place, and time. She appears well-developed and well-nourished.  Elderly woman sitting comfortably in the exam room in no acute distress.  She has a cane by her side.  HENT:  Head: Normocephalic and atraumatic.  Curly graying hair.  Eyes: Conjunctivae are normal. No scleral icterus.  Blue eyes.  Cardiovascular: Normal rate, regular rhythm and normal heart sounds. Exam reveals no gallop.  No murmur heard. Pulmonary/Chest: Effort normal and breath sounds normal. No respiratory distress. She has no wheezes. She has no rales.  Musculoskeletal:        General: No edema.  Neurological: She is alert and oriented to person, place, and time.  Skin: Skin is warm, dry and intact. No petechiae and no rash noted. No cyanosis or erythema. No pallor.  Nails show no clubbing.  Psychiatric: She has a normal mood and affect. Her behavior is normal. Judgment and thought content normal.  Nursing note and vitals reviewed.   Appointment on 09/30/2019  Component Date Value Ref Range Status  . Hemoglobin 09/30/2019 8.9* 12.0 - 15.0 g/dL Final  . HCT 09/30/2019 27.5* 36.0 - 46.0 % Final   Performed at [facility], [address] [place], [place] [address]    Assessment:  Felicia [name] is a 86 y.o. female with anemia of chronic kidney disease.  Hemoglobin has ranged between 8.2 - 10.4 over the past 4 years.  Work-up on 08/05/2019 revealed a hematocrit of 27.4, hemoglobin 9.2, MCV 91.3, platelets 141,000, white count 7100 with an [place] of 3799.  Monocyte count was 1299.  Iron saturation was 34% with a TIBC of 194.  Creatinine was 2.15 (CrCl 23 ml/min).  Phosphorus was 4.4 (elevated). Calcium  was 8.5 with an albumen of 3.8.  PTH was 102.  ANA was + with a titer of 1:320 with cytoplasmic pattern and a dsDNA of 11 (+).  SPEP revealed a poorly defined band of restricted protein mobility in the gammaglobulin region.  Work-up on 09/17/2019 revealed a hematocrit 26.7, hemoglobin 8.9, MCV 91.8, platelets 130,000, WBC 800, ANC 3700.  Ferritin was 748.  Normal studies included:  B12 (700), folate (12.1), SPEP, and immunoglobulins.  Kappa free light chain ratio was 1.75 (0.26-1.75).  Peripheral smear revealed a normocytic anemia with mild thrombocytopenia.  The morphology of the RBCs, WBCs, and platelets were within normal limits.  There was no evidence of circulating blasts.  She has stage IV chronic kidney disease.  Symptomatically, she is doing well.  Plan: 1.   Labs today: HCT/Hgb. 2.   Normocytic anemia  Hematocrit 27.5.  Hemoglobin 8.9.   Etiology secondary to anemia of chronic renal disease.  Review work-up.  No other abnormalities.  Discuss plan to initiate Retacrit.   Potential side effects reviewed.   Patient consented to  treatment. 3.   Poorly defined band of restricted protein mobility  SPEP and FLCA were normal. 4.   Monocytosis  Patient appears to have a recurrent monocytosis.  Consideration of CMML with monocytosis (>= 1000; >= 10% WBC) lasting > 3 months.  Peripheral smear was unremakable.  Continue to monitor. 5.   Retacrit today. 6.   RTC every 2 weeks for labs (HCT/Hgb) and +/- Retacrit. 7.   RTC in 8 weeks for labs (CBC with diff, BMP, ferritin), and +/- Retacrit.  I discussed the assessment and treatment plan with the patient.  The patient was provided an opportunity to ask questions and all were answered.  The patient agreed with the plan and demonstrated an understanding of the instructions.  The patient was advised to call back if the symptoms worsen or if the condition fails to improve as anticipated.   Alyssah Algeo C. Mike Gip, MD, PhD    09/30/2019, 3:12 PM  I, Samul Dada, am acting as scribe for [employer]. Mike Gip, MD, PhD.  I, Kerriann Kamphuis C. Mike Gip, MD, have reviewed the above documentation for accuracy and completeness, and I agree with the above.

## 2019-09-30 ENCOUNTER — Inpatient Hospital Stay: Payer: Medicare Other

## 2019-09-30 ENCOUNTER — Inpatient Hospital Stay (HOSPITAL_BASED_OUTPATIENT_CLINIC_OR_DEPARTMENT_OTHER): Payer: Medicare Other | Admitting: Hematology and Oncology

## 2019-09-30 ENCOUNTER — Encounter: Payer: Self-pay | Admitting: Hematology and Oncology

## 2019-09-30 VITALS — BP 113/58 | HR 67 | Temp 98.9°F | Resp 18 | Ht 69.0 in | Wt 187.3 lb

## 2019-09-30 DIAGNOSIS — D631 Anemia in chronic kidney disease: Secondary | ICD-10-CM | POA: Diagnosis not present

## 2019-09-30 DIAGNOSIS — N184 Chronic kidney disease, stage 4 (severe): Secondary | ICD-10-CM

## 2019-09-30 DIAGNOSIS — D649 Anemia, unspecified: Secondary | ICD-10-CM

## 2019-09-30 LAB — HEMOGLOBIN AND HEMATOCRIT, BLOOD
HCT: 27.5 % — ABNORMAL LOW (ref 36.0–46.0)
Hemoglobin: 8.9 g/dL — ABNORMAL LOW (ref 12.0–15.0)

## 2019-09-30 MED ORDER — EPOETIN ALFA-EPBX 10000 UNIT/ML IJ SOLN
10000.0000 [IU] | Freq: Once | INTRAMUSCULAR | Status: AC
  Administered 2019-09-30: 10000 [IU] via SUBCUTANEOUS

## 2019-10-03 DIAGNOSIS — D72821 Monocytosis (symptomatic): Secondary | ICD-10-CM | POA: Insufficient documentation

## 2019-10-13 ENCOUNTER — Other Ambulatory Visit: Payer: Self-pay

## 2019-10-13 ENCOUNTER — Telehealth: Payer: Self-pay

## 2019-10-13 DIAGNOSIS — D649 Anemia, unspecified: Secondary | ICD-10-CM

## 2019-10-14 ENCOUNTER — Inpatient Hospital Stay: Payer: Medicare Other | Attending: Hematology and Oncology

## 2019-10-14 ENCOUNTER — Other Ambulatory Visit: Payer: Self-pay

## 2019-10-14 VITALS — BP 119/65 | HR 62 | Temp 96.7°F | Resp 18

## 2019-10-14 DIAGNOSIS — D631 Anemia in chronic kidney disease: Secondary | ICD-10-CM | POA: Diagnosis present

## 2019-10-14 DIAGNOSIS — N184 Chronic kidney disease, stage 4 (severe): Secondary | ICD-10-CM | POA: Insufficient documentation

## 2019-10-14 DIAGNOSIS — D649 Anemia, unspecified: Secondary | ICD-10-CM

## 2019-10-14 LAB — HEMOGLOBIN AND HEMATOCRIT, BLOOD
HCT: 27.1 % — ABNORMAL LOW (ref 36.0–46.0)
Hemoglobin: 8.8 g/dL — ABNORMAL LOW (ref 12.0–15.0)

## 2019-10-14 MED ORDER — EPOETIN ALFA-EPBX 10000 UNIT/ML IJ SOLN
10000.0000 [IU] | Freq: Once | INTRAMUSCULAR | Status: AC
  Administered 2019-10-14: 15:00:00 10000 [IU] via SUBCUTANEOUS

## 2019-10-14 NOTE — Patient Instructions (Signed)

## 2019-10-28 ENCOUNTER — Inpatient Hospital Stay: Payer: Medicare Other

## 2019-10-28 ENCOUNTER — Other Ambulatory Visit: Payer: Self-pay

## 2019-10-28 VITALS — BP 139/71 | HR 57 | Temp 97.8°F | Resp 18

## 2019-10-28 DIAGNOSIS — N184 Chronic kidney disease, stage 4 (severe): Secondary | ICD-10-CM

## 2019-10-28 DIAGNOSIS — D631 Anemia in chronic kidney disease: Secondary | ICD-10-CM

## 2019-10-28 DIAGNOSIS — D649 Anemia, unspecified: Secondary | ICD-10-CM

## 2019-10-28 LAB — HEMOGLOBIN AND HEMATOCRIT, BLOOD
HCT: 26.7 % — ABNORMAL LOW (ref 36.0–46.0)
Hemoglobin: 8.7 g/dL — ABNORMAL LOW (ref 12.0–15.0)

## 2019-10-28 MED ORDER — EPOETIN ALFA-EPBX 10000 UNIT/ML IJ SOLN
10000.0000 [IU] | Freq: Once | INTRAMUSCULAR | Status: AC
  Administered 2019-10-28: 10000 [IU] via SUBCUTANEOUS

## 2019-10-28 NOTE — Patient Instructions (Signed)

## 2019-11-05 ENCOUNTER — Other Ambulatory Visit: Payer: Self-pay

## 2019-11-05 DIAGNOSIS — D649 Anemia, unspecified: Secondary | ICD-10-CM

## 2019-11-05 DIAGNOSIS — D631 Anemia in chronic kidney disease: Secondary | ICD-10-CM

## 2019-11-05 DIAGNOSIS — N184 Chronic kidney disease, stage 4 (severe): Secondary | ICD-10-CM

## 2019-11-11 ENCOUNTER — Inpatient Hospital Stay: Payer: Medicare Other | Attending: Hematology and Oncology

## 2019-11-11 ENCOUNTER — Inpatient Hospital Stay: Payer: Medicare Other

## 2019-11-11 ENCOUNTER — Other Ambulatory Visit: Payer: Self-pay

## 2019-11-11 VITALS — BP 103/61 | HR 61 | Resp 18

## 2019-11-11 DIAGNOSIS — D631 Anemia in chronic kidney disease: Secondary | ICD-10-CM

## 2019-11-11 DIAGNOSIS — N184 Chronic kidney disease, stage 4 (severe): Secondary | ICD-10-CM | POA: Diagnosis not present

## 2019-11-11 DIAGNOSIS — D649 Anemia, unspecified: Secondary | ICD-10-CM

## 2019-11-11 LAB — HEMOGLOBIN AND HEMATOCRIT, BLOOD
HCT: 27.3 % — ABNORMAL LOW (ref 36.0–46.0)
Hemoglobin: 8.9 g/dL — ABNORMAL LOW (ref 12.0–15.0)

## 2019-11-11 MED ORDER — EPOETIN ALFA-EPBX 10000 UNIT/ML IJ SOLN
10000.0000 [IU] | Freq: Once | INTRAMUSCULAR | Status: AC
  Administered 2019-11-11: 10000 [IU] via SUBCUTANEOUS

## 2019-11-11 NOTE — Patient Instructions (Signed)

## 2019-11-23 NOTE — Progress Notes (Signed)
[facility]  [address] [place], [place] [address] Phone: [phone]  Fax: [phone]   Clinic Day:  11/25/2019  Referring physician: Sofie Hartigan, MD  Chief Complaint: Felicia [name] is a 86 y.o. female with anemia in chronic kidney disease who is seen for a 2 month assessment.  HPI: The patient was last seen in the hematology clinic on 09/30/2019. At that time, she was doing well. Hematocrit was 27.5 and hemoglobin 8.9. Patient received Retacrit.   The patient received Retacrit biweekly x 3 (10/14/2019 - 11/11/2019).   Hemoglobin ranged from 8.7 - 8.9.  Patient was seen by Dr. Nehemiah Massed on 11/19/2019. Exam was stable. Patient continued current regimen. Follow up was planned for 04/10/2020.   During the interim, the patient has done "just fine". Patient reports she has been staying safe at home during the pandemic and is unsure if she should get the COVID-19 vaccine while taking shots. She reports tolerating Retacrit well.  We discussed her stable hemoglobin and thoughts about a dose increase.  She agreed to an increase from 10,000 to 20,000 units every 2 weeks.   She reports her stool is normal in color.  She denies any bloody or black stool. She has restless legs associated with twitching. She reports feeling tired. Patient feels excited about her new great grand daughter who was just born. She has yet to see her in person but she excited to see her.   Patient BP is elevated at 160/56 today which she attributes to having a busy day. She reports her BP fluctuates.  We discussed the importance of maintaining a normal blood pressure with her injections as Retacrit is held for hypertension.   Past Medical History:  Diagnosis Date  . GERD (gastroesophageal reflux disease)   . HOH (hard of hearing)   . Hypertension   . Pneumonia   . Vertigo     Past Surgical History:  Procedure Laterality Date  . APPENDECTOMY  1939  .  TONSILLECTOMY  1939    Family History  Problem Relation Age of Onset  . Heart disease Mother   . Goiter Mother   . Bladder Cancer Father 1    Social History:  reports that she has never smoked. She has never used smokeless tobacco. She reports that she does not drink alcohol or use drugs. has had no exposure to any radiation or toxins. Her husband started [employer] in [place]. After he passed she went to work for [employer] for 23 years. 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 She lives by herself with 2 cats named brother and sister. She lives in [place]. She has 1 son who lives in [place]. She recently had a new great granddaughter on 11/25/2019. The patient is alone today.  Allergies: No Known Allergies  Current Medications: Current Outpatient Medications  Medication Sig Dispense Refill  . albuterol (PROVENTIL HFA;VENTOLIN HFA) 108 (90 Base) MCG/ACT inhaler Inhale 2 puffs into the lungs every 6 (six) hours as needed for wheezing or shortness of breath. 1 Inhaler 0  . amLODipine (NORVASC) 10 MG tablet Take 10 mg by mouth daily.    . carvedilol (COREG) 6.25 MG tablet Take 6.25 mg by mouth 2 (two) times daily with a meal.    . cloNIDine (CATAPRES - DOSED IN MG/24 HR) 0.3 mg/24hr patch Place 0.3 mg onto the skin once a week.    . cyanocobalamin  1000 MCG tablet Take 1,000 mcg by mouth daily.    Marland Kitchen omeprazole (PRILOSEC) 20 MG capsule Take 20 mg by mouth daily.    Marland Kitchen telmisartan (MICARDIS) 80 MG tablet Take 80 mg by mouth daily.    . Ferrous Sulfate (IRON) 325 (65 Fe) MG TABS Take by mouth.     No current facility-administered medications for this visit.    Review of Systems  Constitutional: Positive for malaise/fatigue and weight loss (3 lbs). Negative for chills, diaphoresis and fever.       Doing "just fine".  HENT: Negative for congestion, ear pain, hearing loss, nosebleeds, sinus pain and sore throat.   Eyes: Negative.   Negative for blurred vision and double vision.  Respiratory: Negative.  Negative for cough, hemoptysis, sputum production and shortness of breath.   Cardiovascular: Negative.  Negative for chest pain, palpitations, leg swelling and PND.  Gastrointestinal: Negative for abdominal pain, blood in stool, constipation, diarrhea, melena, nausea and vomiting.       Reflux controlled with omeprazole.  Genitourinary: Negative for frequency, hematuria and urgency.       Incontinence; wears adult diapers.  Musculoskeletal: Negative for back pain, joint pain (hands), myalgias and neck pain.  Skin: Negative.  Negative for rash.  Neurological: Positive for sensory change (restless legs; twitching sensation) and weakness (generalized; needs cane to help walk). Negative for dizziness, speech change, focal weakness and headaches.  Endo/Heme/Allergies: Negative.  Does not bruise/bleed easily.  Psychiatric/Behavioral: Negative.  Negative for depression and memory loss. The patient is not nervous/anxious and does not have insomnia.   All other systems reviewed and are negative.  Performance status (ECOG): 1  Vitals Blood pressure (!) 160/56, pulse 60, temperature (!) 96.4 F (35.8 C), temperature source Tympanic, resp. rate 18, weight 184 lb 8.4 oz (83.7 kg), SpO2 100 %.   Physical Exam Vitals and nursing note reviewed.  Constitutional:      Appearance: She is well-developed and well-nourished.     Comments: Elderly woman sitting comfortably in the exam room in no acute distress.  She has a cane by her side. Needing assistance on/off exam room table.  HENT:     Head: Normocephalic and atraumatic.     Mouth/Throat:     Mouth: Oropharynx is clear and moist.     Pharynx: No oropharyngeal exudate.      Comments: Curly graying hair. Mask. Eyes:     General: No scleral icterus.    Extraocular Movements: EOM normal.     Conjunctiva/sclera: Conjunctivae normal.     Pupils: Pupils are equal, round, and  reactive to light.     Comments: Blue eyes.  Cardiovascular:     Rate and Rhythm: Normal rate and regular rhythm.     Heart sounds: Murmur heard.  No gallop.   Pulmonary:     Effort: Pulmonary effort is normal. No respiratory distress.     Breath sounds: Normal breath sounds. No wheezing or rales.  Abdominal:     General: Bowel sounds are normal. There is no distension.     Palpations: Abdomen is soft. There is no mass.     Tenderness: There is no abdominal tenderness. There is no guarding or rebound.  Musculoskeletal:        General: No tenderness or edema. Normal range of motion.     Cervical back: Normal range of motion and neck supple.  Lymphadenopathy:     Head:     Right side of head: No preauricular, posterior auricular  or occipital adenopathy.     Left side of head: No preauricular, posterior auricular or occipital adenopathy.     Cervical: No cervical adenopathy.     Upper Body:  No axillary adenopathy present.    Right upper body: No supraclavicular adenopathy.     Left upper body: No supraclavicular adenopathy.     Lower Body: No right inguinal adenopathy. No left inguinal adenopathy.  Skin:    General: Skin is warm, dry and intact.     Coloration: Skin is not pale.     Findings: No erythema, petechiae or rash.     Nails: There is no clubbing or cyanosis.  Neurological:     Mental Status: She is alert and oriented to person, place, and time.  Psychiatric:        Mood and Affect: Mood and affect normal.        Behavior: Behavior normal.        Thought Content: Thought content normal.        Judgment: Judgment normal.     Appointment on 11/25/2019  Component Date Value Ref Range Status  . Sodium 11/25/2019 137  135 - 145 mmol/L Final  . Potassium 11/25/2019 4.4  3.5 - 5.1 mmol/L Final  . Chloride 11/25/2019 106  98 - 111 mmol/L Final  . CO2 11/25/2019 24  22 - 32 mmol/L Final  . Glucose, Bld 11/25/2019 92  70 - 99 mg/dL Final   Glucose reference range applies  only to samples taken after fasting for at least 8 hours.  . BUN 11/25/2019 35* 8 - 23 mg/dL Final  . Creatinine, Ser 11/25/2019 2.03* 0.44 - 1.00 mg/dL Final  . Calcium 11/25/2019 8.9  8.9 - 10.3 mg/dL Final  . GFR calc non Af Amer 11/25/2019 21* >60 mL/min Final  . GFR calc Af Amer 11/25/2019 25* >60 mL/min Final  . Anion gap 11/25/2019 7  5 - 15 Final   Performed at [facility], [address] [place], [place] [address]  . WBC 11/25/2019 7.0  4.0 - 10.5 K/uL Final  . RBC 11/25/2019 2.98* 3.87 - 5.11 MIL/uL Final  . Hemoglobin 11/25/2019 8.9* 12.0 - 15.0 g/dL Final  . HCT 11/25/2019 27.7* 36.0 - 46.0 % Final  . MCV 11/25/2019 93.0  80.0 - 100.0 fL Final  . MCH 11/25/2019 29.9  26.0 - 34.0 pg Final  . MCHC 11/25/2019 32.1  30.0 - 36.0 g/dL Final  . RDW 11/25/2019 14.8  11.5 - 15.5 % Final  . Platelets 11/25/2019 142* 150 - 400 K/uL Final  . nRBC 11/25/2019 0.0  0.0 - 0.2 % Final  . Neutrophils Relative % 11/25/2019 41  % Final  . Neutro Abs 11/25/2019 2.9  1.7 - 7.7 K/uL Final  . Lymphocytes Relative 11/25/2019 32  % Final  . Lymphs Abs 11/25/2019 2.2  0.7 - 4.0 K/uL Final  . Monocytes Relative 11/25/2019 20  % Final  . Monocytes Absolute 11/25/2019 1.4* 0.1 - 1.0 K/uL Final  . Eosinophils Relative 11/25/2019 0  % Final  . Eosinophils Absolute 11/25/2019 0.0  0.0 - 0.5 K/uL Final  . Basophils Relative 11/25/2019 0  % Final  . Basophils Absolute 11/25/2019 0.0  0.0 - 0.1 K/uL Final  . Immature Granulocytes 11/25/2019 7  % Final  . Abs Immature Granulocytes 11/25/2019 0.48* 0.00 - 0.07 K/uL Final   Performed at [facility], [address], [place], [place] [address]    Assessment:  Earlie Server  Bond Crehan is a 86 y.o. female with anemia of chronic kidney disease.  Hemoglobin has ranged between 8.2 - 10.4 over the past 4 years.  Work-up on 08/05/2019 revealed a hematocrit of 27.4, hemoglobin 9.2, MCV 91.3, platelets 141,000, white count 7100 with an [place]  of 3799.  Monocyte count was 1299.  Iron saturation was 34% with a TIBC of 194.  Creatinine was 2.15 (CrCl 23 ml/min).  Phosphorus was 4.4 (elevated). Calcium was 8.5 with an albumen of 3.8.  PTH was 102.  ANA was + with a titer of 1:320 with cytoplasmic pattern and a dsDNA of 11 (+).  SPEP revealed a poorly defined band of restricted protein mobility in the gammaglobulin region.  Work-up on 09/17/2019 revealed a hematocrit 26.7, hemoglobin 8.9, MCV 91.8, platelets 130,000, WBC 800, ANC 3700.  Ferritin was 748.  Normal studies included:  B12 (700), folate (12.1), SPEP, and immunoglobulins.  Kappa free light chain ratio was 1.75 (0.26-1.75).  Peripheral smear revealed a normocytic anemia with mild thrombocytopenia.  The morphology of the RBCs, WBCs, and platelets were within normal limits.  There was no evidence of circulating blasts.  She has stage IV chronic kidney disease.  Symptomatically, she feels "fine".  She denies any bleeding.  Exam is stable.  Plan: 1.   Labs today: CBC with diff, BMP, ferritin. 2.   Normocytic anemia             Hematocrit 27.7.  Hemoglobin 8.9.                  Etiology secondary to anemia of chronic renal disease.             She has been on Retacrit 10,000 units every 2 weeks x 1 month without improvement.  Discuss increasing Retacrit to 20,000 units.  Patient in agreement. 3.   Poorly defined band of restricted protein mobility  SPEP and FLCA were normal. 4.   Monocytosis             Patient has persistent monocytosis.             Consideration of CMML with monocytosis (>= 1000; >= 10% WBC) lasting > 3 months.             Peripheral smear was unremakable.             Consider bone marrow. 5.   Retacrit 20,000 units today. 6.   RTC every 2 weeks for labs (HCT/Hgb) and +/- Retacrit. 7.   RTC in 8 weeks for MD assessment, labs (CBC with diff, BMP, ferritin), and +/- Retacrit.   I discussed the assessment and treatment plan with the patient.  The patient was  provided an opportunity to ask questions and all were answered.  The patient agreed with the plan and demonstrated an understanding of the instructions.  The patient was advised to call back if the symptoms worsen or if the condition fails to improve as anticipated.  I provided 16 minutes of face-to-face time during this encounter and > 50% was spent counseling as documented under my assessment and plan.  An additional 5 minutes were spent reviewing her chart (Epic and Care Everywhere) including notes, labs, and imaging studies.    Lequita Asal, MD, PhD    11/25/2019, 2:59 PM  I, Selena Batten, am acting as scribe for [employer]. Mike Gip, MD, PhD.  I, Calianna Kim C. Mike Gip, MD, have reviewed the above documentation for accuracy and completeness, and I agree with  the above.

## 2019-11-25 ENCOUNTER — Encounter: Payer: Self-pay | Admitting: Hematology and Oncology

## 2019-11-25 ENCOUNTER — Inpatient Hospital Stay (HOSPITAL_BASED_OUTPATIENT_CLINIC_OR_DEPARTMENT_OTHER): Payer: Medicare Other | Admitting: Hematology and Oncology

## 2019-11-25 ENCOUNTER — Other Ambulatory Visit: Payer: Self-pay

## 2019-11-25 ENCOUNTER — Inpatient Hospital Stay: Payer: Medicare Other

## 2019-11-25 VITALS — BP 148/75 | HR 58 | Temp 96.4°F | Resp 18 | Wt 184.5 lb

## 2019-11-25 DIAGNOSIS — N184 Chronic kidney disease, stage 4 (severe): Secondary | ICD-10-CM

## 2019-11-25 DIAGNOSIS — D649 Anemia, unspecified: Secondary | ICD-10-CM

## 2019-11-25 DIAGNOSIS — D631 Anemia in chronic kidney disease: Secondary | ICD-10-CM | POA: Diagnosis not present

## 2019-11-25 DIAGNOSIS — D72821 Monocytosis (symptomatic): Secondary | ICD-10-CM

## 2019-11-25 LAB — IRON AND TIBC
Iron: 63 ug/dL (ref 28–170)
Saturation Ratios: 27 % (ref 10.4–31.8)
TIBC: 237 ug/dL — ABNORMAL LOW (ref 250–450)
UIBC: 174 ug/dL

## 2019-11-25 LAB — CBC WITH DIFFERENTIAL/PLATELET
Abs Immature Granulocytes: 0.48 10*3/uL — ABNORMAL HIGH (ref 0.00–0.07)
Basophils Absolute: 0 10*3/uL (ref 0.0–0.1)
Basophils Relative: 0 %
Eosinophils Absolute: 0 10*3/uL (ref 0.0–0.5)
Eosinophils Relative: 0 %
HCT: 27.7 % — ABNORMAL LOW (ref 36.0–46.0)
Hemoglobin: 8.9 g/dL — ABNORMAL LOW (ref 12.0–15.0)
Immature Granulocytes: 7 %
Lymphocytes Relative: 32 %
Lymphs Abs: 2.2 10*3/uL (ref 0.7–4.0)
MCH: 29.9 pg (ref 26.0–34.0)
MCHC: 32.1 g/dL (ref 30.0–36.0)
MCV: 93 fL (ref 80.0–100.0)
Monocytes Absolute: 1.4 10*3/uL — ABNORMAL HIGH (ref 0.1–1.0)
Monocytes Relative: 20 %
Neutro Abs: 2.9 10*3/uL (ref 1.7–7.7)
Neutrophils Relative %: 41 %
Platelets: 142 10*3/uL — ABNORMAL LOW (ref 150–400)
RBC: 2.98 MIL/uL — ABNORMAL LOW (ref 3.87–5.11)
RDW: 14.8 % (ref 11.5–15.5)
WBC: 7 10*3/uL (ref 4.0–10.5)
nRBC: 0 % (ref 0.0–0.2)

## 2019-11-25 LAB — BASIC METABOLIC PANEL
Anion gap: 7 (ref 5–15)
BUN: 35 mg/dL — ABNORMAL HIGH (ref 8–23)
CO2: 24 mmol/L (ref 22–32)
Calcium: 8.9 mg/dL (ref 8.9–10.3)
Chloride: 106 mmol/L (ref 98–111)
Creatinine, Ser: 2.03 mg/dL — ABNORMAL HIGH (ref 0.44–1.00)
GFR calc Af Amer: 25 mL/min — ABNORMAL LOW (ref >60)
GFR calc non Af Amer: 21 mL/min — ABNORMAL LOW (ref >60)
Glucose, Bld: 92 mg/dL (ref 70–99)
Potassium: 4.4 mmol/L (ref 3.5–5.1)
Sodium: 137 mmol/L (ref 135–145)

## 2019-11-25 LAB — FERRITIN: Ferritin: 666 ng/mL — ABNORMAL HIGH (ref 11–307)

## 2019-11-25 MED ORDER — EPOETIN ALFA-EPBX 10000 UNIT/ML IJ SOLN
20000.0000 [IU] | Freq: Once | INTRAMUSCULAR | Status: AC
  Administered 2019-11-25: 20000 [IU] via SUBCUTANEOUS

## 2019-11-25 NOTE — Patient Instructions (Signed)

## 2019-11-25 NOTE — Progress Notes (Signed)
Patient here for follow up. Denies any concerns.  

## 2019-12-09 ENCOUNTER — Inpatient Hospital Stay: Payer: Medicare Other

## 2019-12-09 ENCOUNTER — Other Ambulatory Visit: Payer: Self-pay

## 2019-12-09 ENCOUNTER — Inpatient Hospital Stay: Payer: Medicare Other | Attending: Hematology and Oncology

## 2019-12-09 VITALS — BP 107/61 | HR 56 | Temp 96.5°F | Resp 18

## 2019-12-09 DIAGNOSIS — D631 Anemia in chronic kidney disease: Secondary | ICD-10-CM | POA: Diagnosis present

## 2019-12-09 DIAGNOSIS — D72821 Monocytosis (symptomatic): Secondary | ICD-10-CM

## 2019-12-09 DIAGNOSIS — N184 Chronic kidney disease, stage 4 (severe): Secondary | ICD-10-CM

## 2019-12-09 DIAGNOSIS — N189 Chronic kidney disease, unspecified: Secondary | ICD-10-CM | POA: Diagnosis not present

## 2019-12-09 LAB — HEMOGLOBIN AND HEMATOCRIT, BLOOD
HCT: 26.5 % — ABNORMAL LOW (ref 36.0–46.0)
Hemoglobin: 8.5 g/dL — ABNORMAL LOW (ref 12.0–15.0)

## 2019-12-09 MED ORDER — EPOETIN ALFA-EPBX 10000 UNIT/ML IJ SOLN
20000.0000 [IU] | Freq: Once | INTRAMUSCULAR | Status: AC
  Administered 2019-12-09: 12:00:00 20000 [IU] via SUBCUTANEOUS

## 2019-12-09 NOTE — Patient Instructions (Signed)

## 2019-12-23 ENCOUNTER — Other Ambulatory Visit: Payer: Self-pay

## 2019-12-23 ENCOUNTER — Inpatient Hospital Stay: Payer: Medicare Other

## 2019-12-23 VITALS — BP 139/64 | HR 62 | Temp 96.9°F | Resp 18

## 2019-12-23 DIAGNOSIS — D631 Anemia in chronic kidney disease: Secondary | ICD-10-CM

## 2019-12-23 DIAGNOSIS — N189 Chronic kidney disease, unspecified: Secondary | ICD-10-CM | POA: Diagnosis not present

## 2019-12-23 DIAGNOSIS — D72821 Monocytosis (symptomatic): Secondary | ICD-10-CM

## 2019-12-23 LAB — HEMOGLOBIN AND HEMATOCRIT, BLOOD
HCT: 28.9 % — ABNORMAL LOW (ref 36.0–46.0)
Hemoglobin: 9.3 g/dL — ABNORMAL LOW (ref 12.0–15.0)

## 2019-12-23 MED ORDER — EPOETIN ALFA-EPBX 10000 UNIT/ML IJ SOLN
20000.0000 [IU] | Freq: Once | INTRAMUSCULAR | Status: AC
  Administered 2019-12-23: 20000 [IU] via SUBCUTANEOUS

## 2020-01-06 ENCOUNTER — Inpatient Hospital Stay: Payer: Medicare Other

## 2020-01-06 ENCOUNTER — Inpatient Hospital Stay: Payer: Medicare Other | Attending: Hematology and Oncology

## 2020-01-06 ENCOUNTER — Other Ambulatory Visit: Payer: Self-pay

## 2020-01-06 VITALS — BP 109/54 | HR 62 | Temp 97.0°F | Resp 18

## 2020-01-06 DIAGNOSIS — N184 Chronic kidney disease, stage 4 (severe): Secondary | ICD-10-CM | POA: Diagnosis present

## 2020-01-06 DIAGNOSIS — D72821 Monocytosis (symptomatic): Secondary | ICD-10-CM

## 2020-01-06 DIAGNOSIS — D631 Anemia in chronic kidney disease: Secondary | ICD-10-CM | POA: Diagnosis present

## 2020-01-06 LAB — HEMOGLOBIN AND HEMATOCRIT, BLOOD
HCT: 28 % — ABNORMAL LOW (ref 36.0–46.0)
Hemoglobin: 9.1 g/dL — ABNORMAL LOW (ref 12.0–15.0)

## 2020-01-06 MED ORDER — EPOETIN ALFA-EPBX 10000 UNIT/ML IJ SOLN
20000.0000 [IU] | Freq: Once | INTRAMUSCULAR | Status: AC
  Administered 2020-01-06: 20000 [IU] via SUBCUTANEOUS

## 2020-01-06 NOTE — Patient Instructions (Signed)

## 2020-01-18 NOTE — Progress Notes (Signed)
[facility]  [address] [place], [place] [address] Phone: [phone]  Fax: [phone]   Clinic Day:  01/20/2020  Referring physician: Sofie Hartigan, MD  Chief Complaint: Felicia [name] is a 86 y.o. female with anemia in chronic kidney disease on Retacrit who is seen for 2 month assessment.  HPI: Felicia [name] was last seen in Felicia hematology clinic on 11/25/2019. At that time, Felicia [name] was doing well.  Hematocrit was 27.7, hemoglobin 8.9, platelets 142,000, WBC 7,000. Ferritin was 666 with an iron saturation of 27% and a TIBC of 237. Creatinine was 2.03.  We discussed increasing her Retacrit as her hemoglobin had not improved.  Felicia [name] received Retacrit 20,000 units.   [name] received Retacrit on (12/09/2019 - 01/06/2020).   CBC followed: 12/09/2019: Hematocrit 26.5. Hemoglobin 8.5.  12/23/2019: Hematocrit 28.9. Hemoglobin 9.3. 01/06/2020: Hematocrit 28.0. Hemoglobin 9.1.  During Felicia interim, Felicia [name] felt "pretty good". [name] feels tired today due to not sleeping well x 2 nights. Her symptoms are Felicia same. Felicia [name] notes upcoming appointments with Dr. Nehemiah Massed and Dr. Ellison Hughs.    Past Medical History:  Diagnosis Date  . GERD (gastroesophageal reflux disease)   . HOH (hard of hearing)   . Hypertension   . Pneumonia   . Vertigo     Past Surgical History:  Procedure Laterality Date  . APPENDECTOMY  1939  . TONSILLECTOMY  1939    Family History  Problem Relation Age of Onset  . Heart disease Mother   . Goiter Mother   . Bladder Cancer Father 45    Social History:  reports that Felicia [name] has never smoked. Felicia [name] has never used smokeless tobacco. Felicia [name] reports that Felicia [name] does not drink alcohol or use drugs. Has had no exposure to any radiation or toxins. Her husband started [employer] in [place]. After he passed Felicia [name] went to work for [employer] for 23 years. 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 Felicia [name] lives by herself with 2 cats named brother and sister. Felicia [name] lives in [place]. Felicia [name] has 1 son who lives in [place]. Felicia [name] recently had a new great granddaughter on 11/25/2019. Felicia [name] is alone today.  Allergies: No Known Allergies  Current Medications: Current Outpatient Medications  Medication Sig Dispense Refill  . albuterol (PROVENTIL HFA;VENTOLIN HFA) 108 (90 Base) MCG/ACT inhaler Inhale 2 puffs into Felicia lungs every 6 (six) hours as needed for wheezing or shortness of breath. 1 Inhaler 0  . amLODipine (NORVASC) 10 MG tablet Take 10 mg by mouth daily.    . carvedilol (COREG) 6.25 MG tablet Take 6.25 mg by mouth 2 (two) times daily with a meal.    . cloNIDine (CATAPRES - DOSED IN MG/24 HR) 0.3 mg/24hr patch Place 0.3 mg onto Felicia skin once a week.    . cyanocobalamin 1000 MCG tablet Take 1,000 mcg by mouth daily.    Marland Kitchen omeprazole (PRILOSEC) 20 MG capsule Take 20 mg by mouth daily.    Marland Kitchen telmisartan (MICARDIS) 80 MG tablet Take 80 mg by mouth daily.    . Ferrous Sulfate (IRON) 325 (65 Fe) MG TABS Take by mouth.     No current facility-administered medications for this visit.    Review of Systems  Constitutional: Positive for weight loss (2 lbs). Negative for chills, diaphoresis, fever and malaise/fatigue.       Feels "pretty good".  HENT: Negative for congestion, ear discharge, ear pain, hearing  loss, nosebleeds, sinus pain, sore throat and tinnitus.   Eyes: Negative for blurred vision.  Respiratory: Negative for cough, hemoptysis, sputum production and shortness of breath.   Cardiovascular: Negative for chest pain, palpitations and leg swelling.  Gastrointestinal: Negative for abdominal pain, blood in stool, constipation, diarrhea, heartburn, melena, nausea and vomiting.       Reflux controlled with omeprazole.  Genitourinary: Negative for dysuria, flank pain, frequency, hematuria and urgency.       Incontinence; wears adult diapers.  Musculoskeletal: Negative for back pain,  joint pain (hands), myalgias and neck pain.  Skin: Negative for itching and rash.  Neurological: Positive for sensory change (restless legs; twitchinf sensation) and weakness (generalized; needs cane). Negative for dizziness, tingling and headaches.  Endo/Heme/Allergies: Does not bruise/bleed easily.  Psychiatric/Behavioral: Negative for depression. Felicia [name] has insomnia (x 2 nights). Felicia [name] is not nervous/anxious.   All other systems reviewed and are negative.  Performance status (ECOG): 1  Vitals Blood pressure (!) 133/47, pulse 71, temperature (!) 97.1 F (36.2 C), temperature source Tympanic, resp. rate 16, weight 182 lb 8.7 oz (82.8 kg), SpO2 94 %.   Physical Exam  Constitutional: Felicia [name] is oriented to person, place, and time. Felicia [name] appears well-developed and well-nourished. No distress.  Elderly woman sitting comfortably in Felicia exam room in no acute distress.  Felicia [name] has a cane by her side. Needing assistance on/off exam room table.  HENT:  Head: Normocephalic and atraumatic.  Mouth/Throat: Oropharynx is clear and moist. No oropharyngeal exudate.  Curly graying hair. Mask.   Eyes: Pupils are equal, round, and reactive to light. Conjunctivae and EOM are normal. No scleral icterus.  Blue eyes.  Cardiovascular: Normal rate, regular rhythm and normal heart sounds.  No murmur heard. Pulmonary/Chest: Effort normal and breath sounds normal. No respiratory distress. Felicia [name] has no wheezes. Felicia [name] has no rales. Felicia [name] exhibits no tenderness.  Abdominal: Soft. Bowel sounds are normal. Felicia [name] exhibits no distension and no mass. There is no abdominal tenderness. There is no rebound and no guarding.  Musculoskeletal:        General: No tenderness or edema. Normal range of motion.     Cervical back: Normal range of motion and neck supple.  Lymphadenopathy:    Felicia [name] has no cervical adenopathy.    Felicia [name] has no axillary adenopathy.       Right: No supraclavicular adenopathy present.       Left: No  supraclavicular adenopathy present.  Neurological: Felicia [name] is alert and oriented to person, place, and time.  Skin: Skin is warm and dry. Felicia [name] is not diaphoretic.  Psychiatric: Felicia [name] has a normal mood and affect. Her behavior is normal. Judgment and thought content normal.  Nursing note and vitals reviewed.   Appointment on 01/20/2020  Component Date Value Ref Range Status  . Sodium 01/20/2020 141  135 - 145 mmol/L Final  . Potassium 01/20/2020 4.8  3.5 - 5.1 mmol/L Final  . Chloride 01/20/2020 104  98 - 111 mmol/L Final  . CO2 01/20/2020 26  22 - 32 mmol/L Final  . Glucose, Bld 01/20/2020 94  70 - 99 mg/dL Final   Glucose reference range applies only to samples taken after fasting for at least 8 hours.  . BUN 01/20/2020 27* 8 - 23 mg/dL Final  . Creatinine, Ser 01/20/2020 1.94* 0.44 - 1.00 mg/dL Final  . Calcium 01/20/2020 9.0  8.9 - 10.3 mg/dL Final  . GFR calc non Af Amer 01/20/2020 22* >60 mL/min Final  . GFR calc  Af Amer 01/20/2020 26* >60 mL/min Final  . Anion gap 01/20/2020 11  5 - 15 Final   Performed at [facility], [address], [place], [place] [address]  . WBC 01/20/2020 7.7  4.0 - 10.5 K/uL Final  . RBC 01/20/2020 3.25* 3.87 - 5.11 MIL/uL Final  . Hemoglobin 01/20/2020 9.6* 12.0 - 15.0 g/dL Final  . HCT 01/20/2020 30.0* 36.0 - 46.0 % Final  . MCV 01/20/2020 92.3  80.0 - 100.0 fL Final  . MCH 01/20/2020 29.5  26.0 - 34.0 pg Final  . MCHC 01/20/2020 32.0  30.0 - 36.0 g/dL Final  . RDW 01/20/2020 14.6  11.5 - 15.5 % Final  . Platelets 01/20/2020 139* 150 - 400 K/uL Final  . nRBC 01/20/2020 0.0  0.0 - 0.2 % Final  . Neutrophils Relative % 01/20/2020 43  % Final  . Neutro Abs 01/20/2020 3.3  1.7 - 7.7 K/uL Final  . Lymphocytes Relative 01/20/2020 31  % Final  . Lymphs Abs 01/20/2020 2.3  0.7 - 4.0 K/uL Final  . Monocytes Relative 01/20/2020 20  % Final  . Monocytes Absolute 01/20/2020 1.5* 0.1 - 1.0 K/uL Final  . Eosinophils Relative 01/20/2020 0  % Final  .  Eosinophils Absolute 01/20/2020 0.0  0.0 - 0.5 K/uL Final  . Basophils Relative 01/20/2020 0  % Final  . Basophils Absolute 01/20/2020 0.0  0.0 - 0.1 K/uL Final  . Immature Granulocytes 01/20/2020 6  % Final  . Abs Immature Granulocytes 01/20/2020 0.47* 0.00 - 0.07 K/uL Final   Performed at [facility], [address] [place], [place] [address]    Assessment:  Tiyah Zelenak is a 86 y.o. female with anemia of chronic kidney disease. Hemoglobin has ranged between 8.2 - 10.4 over Felicia past 4 years.  Work-up on 12/03/2020revealed a hematocrit of 27.4, hemoglobin 9.2, MCV 91.3, platelets 141,000, white count 7100 with an [place] of 3799. Monocyte countwas 1299. Iron saturation was 34% with a TIBC of 194. Creatinine was 2.15 (CrCl 23 ml/min). Phosphorus was 4.4 (elevated). Calcium was 8.5 with an albumen of 3.8. PTH was 102. ANA was + with a titer of 1:320 with cytoplasmic pattern and a dsDNA of 11 (+). SPEPrevealed a poorly defined band of restricted protein mobility in Felicia gammaglobulin region.  Work-up on01/15/2021revealed a hematocrit26.7, hemoglobin8.9, MCV91.8, platelets130,000, WBC800, [id number]. Normal studies included: B12 (700), folate(12.1), SPEP, and immunoglobulins.Kappa free light chain ratiowas1.75(0.26-1.75).Peripheral smearrevealed a normocytic anemia with mild thrombocytopenia. Felicia morphology of Felicia RBCs, WBCs, and platelets were within normal limits. There was no evidence of circulating blasts.  Felicia [name] has stage IV chronic kidney disease.  Felicia [name] receives Retacrit every 2 weeks (09/30/2019 - 01/06/2020).  Retacrit was increased from 10,000 units to 20,000 units on 01/06/2020.  Symptomatically, Felicia [name] feels "pretty good".  Felicia [name] denies any bleeding.  Exam is stable.  Plan: 1.   Labs today: CBC with diff, BMP, ferritin. 2. Normocytic anemia Hematocrit 30.0. Hemoglobin 9.6.MCV 92.3. Etiology  secondary to anemia of chronic renal disease. Ferritin 620 today.    Labs reviewed.  Continue Retacrit.. 3. Poorly defined band of restricted protein mobility SPEP and FLCA were normal. 4. Monocytosis [name] appears to have a recurrent monocytosis. Consideration of CMML with monocytosis (>= 1000; >= 10% WBC) lasting >3 months. Continue to monitor. 5.Retacrit today. 6.RTC every 2 weeks for labs (HCT/Hgb) and +/- Retacrit. 7.   RTC in 12 weeks for MD assessment, labs (CBC with diff, BMP, ferritin), and +/- Retacrit.  I discussed Felicia assessment and treatment plan with Felicia [name].  Felicia [name] was provided an opportunity to ask questions and all were answered.  Felicia [name] agreed with Felicia plan and demonstrated an understanding of Felicia instructions.  Felicia [name] was advised to call back if Felicia symptoms worsen or if Felicia condition fails to improve as anticipated.   Lequita Asal, MD, PhD    01/20/2020, 2:33 PM  I, Selena Batten, am acting as scribe for [employer]. Mike Gip, MD, PhD.  I, Trusten Hume C. Mike Gip, MD, have reviewed Felicia above documentation for accuracy and completeness, and I agree with Felicia above.

## 2020-01-20 ENCOUNTER — Encounter: Payer: Self-pay | Admitting: Hematology and Oncology

## 2020-01-20 ENCOUNTER — Other Ambulatory Visit: Payer: Self-pay

## 2020-01-20 ENCOUNTER — Inpatient Hospital Stay (HOSPITAL_BASED_OUTPATIENT_CLINIC_OR_DEPARTMENT_OTHER): Payer: Medicare Other | Admitting: Hematology and Oncology

## 2020-01-20 ENCOUNTER — Inpatient Hospital Stay: Payer: Medicare Other

## 2020-01-20 VITALS — BP 133/47 | HR 71 | Temp 97.1°F | Resp 16 | Wt 182.5 lb

## 2020-01-20 DIAGNOSIS — N184 Chronic kidney disease, stage 4 (severe): Secondary | ICD-10-CM

## 2020-01-20 DIAGNOSIS — D631 Anemia in chronic kidney disease: Secondary | ICD-10-CM | POA: Diagnosis not present

## 2020-01-20 DIAGNOSIS — D72821 Monocytosis (symptomatic): Secondary | ICD-10-CM

## 2020-01-20 LAB — CBC WITH DIFFERENTIAL/PLATELET
Abs Immature Granulocytes: 0.47 10*3/uL — ABNORMAL HIGH (ref 0.00–0.07)
Basophils Absolute: 0 10*3/uL (ref 0.0–0.1)
Basophils Relative: 0 %
Eosinophils Absolute: 0 10*3/uL (ref 0.0–0.5)
Eosinophils Relative: 0 %
HCT: 30 % — ABNORMAL LOW (ref 36.0–46.0)
Hemoglobin: 9.6 g/dL — ABNORMAL LOW (ref 12.0–15.0)
Immature Granulocytes: 6 %
Lymphocytes Relative: 31 %
Lymphs Abs: 2.3 10*3/uL (ref 0.7–4.0)
MCH: 29.5 pg (ref 26.0–34.0)
MCHC: 32 g/dL (ref 30.0–36.0)
MCV: 92.3 fL (ref 80.0–100.0)
Monocytes Absolute: 1.5 10*3/uL — ABNORMAL HIGH (ref 0.1–1.0)
Monocytes Relative: 20 %
Neutro Abs: 3.3 10*3/uL (ref 1.7–7.7)
Neutrophils Relative %: 43 %
Platelets: 139 10*3/uL — ABNORMAL LOW (ref 150–400)
RBC: 3.25 MIL/uL — ABNORMAL LOW (ref 3.87–5.11)
RDW: 14.6 % (ref 11.5–15.5)
WBC: 7.7 10*3/uL (ref 4.0–10.5)
nRBC: 0 % (ref 0.0–0.2)

## 2020-01-20 LAB — BASIC METABOLIC PANEL
Anion gap: 11 (ref 5–15)
BUN: 27 mg/dL — ABNORMAL HIGH (ref 8–23)
CO2: 26 mmol/L (ref 22–32)
Calcium: 9 mg/dL (ref 8.9–10.3)
Chloride: 104 mmol/L (ref 98–111)
Creatinine, Ser: 1.94 mg/dL — ABNORMAL HIGH (ref 0.44–1.00)
GFR calc Af Amer: 26 mL/min — ABNORMAL LOW (ref >60)
GFR calc non Af Amer: 22 mL/min — ABNORMAL LOW (ref >60)
Glucose, Bld: 94 mg/dL (ref 70–99)
Potassium: 4.8 mmol/L (ref 3.5–5.1)
Sodium: 141 mmol/L (ref 135–145)

## 2020-01-20 LAB — FERRITIN: Ferritin: 620 ng/mL — ABNORMAL HIGH (ref 11–307)

## 2020-01-20 MED ORDER — EPOETIN ALFA-EPBX 10000 UNIT/ML IJ SOLN
20000.0000 [IU] | Freq: Once | INTRAMUSCULAR | Status: AC
  Administered 2020-01-20: 20000 [IU] via SUBCUTANEOUS

## 2020-01-20 NOTE — Patient Instructions (Signed)

## 2020-01-20 NOTE — Progress Notes (Signed)
Patient feels tired today due to not sleeping well.

## 2020-02-03 ENCOUNTER — Inpatient Hospital Stay: Payer: Medicare Other | Attending: Hematology and Oncology

## 2020-02-03 ENCOUNTER — Inpatient Hospital Stay: Payer: Medicare Other

## 2020-02-03 ENCOUNTER — Other Ambulatory Visit: Payer: Self-pay

## 2020-02-03 VITALS — BP 144/74 | HR 65 | Temp 97.3°F | Resp 18

## 2020-02-03 DIAGNOSIS — D631 Anemia in chronic kidney disease: Secondary | ICD-10-CM

## 2020-02-03 DIAGNOSIS — N184 Chronic kidney disease, stage 4 (severe): Secondary | ICD-10-CM | POA: Diagnosis present

## 2020-02-03 DIAGNOSIS — D72821 Monocytosis (symptomatic): Secondary | ICD-10-CM

## 2020-02-03 LAB — HEMOGLOBIN AND HEMATOCRIT, BLOOD
HCT: 28.1 % — ABNORMAL LOW (ref 36.0–46.0)
Hemoglobin: 9 g/dL — ABNORMAL LOW (ref 12.0–15.0)

## 2020-02-03 MED ORDER — EPOETIN ALFA-EPBX 10000 UNIT/ML IJ SOLN
20000.0000 [IU] | Freq: Once | INTRAMUSCULAR | Status: AC
  Administered 2020-02-03: 20000 [IU] via SUBCUTANEOUS
  Filled 2020-02-03: qty 2

## 2020-02-17 ENCOUNTER — Inpatient Hospital Stay: Payer: Medicare Other

## 2020-02-17 ENCOUNTER — Other Ambulatory Visit: Payer: Self-pay

## 2020-02-17 VITALS — BP 135/71 | HR 67 | Temp 97.0°F | Resp 18

## 2020-02-17 DIAGNOSIS — D72821 Monocytosis (symptomatic): Secondary | ICD-10-CM

## 2020-02-17 DIAGNOSIS — N184 Chronic kidney disease, stage 4 (severe): Secondary | ICD-10-CM | POA: Diagnosis not present

## 2020-02-17 DIAGNOSIS — D631 Anemia in chronic kidney disease: Secondary | ICD-10-CM

## 2020-02-17 LAB — HEMOGLOBIN AND HEMATOCRIT, BLOOD
HCT: 28.1 % — ABNORMAL LOW (ref 36.0–46.0)
Hemoglobin: 9 g/dL — ABNORMAL LOW (ref 12.0–15.0)

## 2020-02-17 MED ORDER — EPOETIN ALFA-EPBX 10000 UNIT/ML IJ SOLN
20000.0000 [IU] | Freq: Once | INTRAMUSCULAR | Status: AC
  Administered 2020-02-17: 20000 [IU] via SUBCUTANEOUS
  Filled 2020-02-17: qty 2

## 2020-03-02 ENCOUNTER — Inpatient Hospital Stay: Payer: Medicare Other

## 2020-03-02 ENCOUNTER — Inpatient Hospital Stay: Payer: Medicare Other | Attending: Hematology and Oncology

## 2020-03-02 ENCOUNTER — Other Ambulatory Visit: Payer: Self-pay

## 2020-03-02 VITALS — BP 127/62 | HR 58 | Temp 98.0°F | Resp 18

## 2020-03-02 DIAGNOSIS — N184 Chronic kidney disease, stage 4 (severe): Secondary | ICD-10-CM | POA: Insufficient documentation

## 2020-03-02 DIAGNOSIS — D72821 Monocytosis (symptomatic): Secondary | ICD-10-CM

## 2020-03-02 DIAGNOSIS — D631 Anemia in chronic kidney disease: Secondary | ICD-10-CM | POA: Diagnosis present

## 2020-03-02 LAB — HEMOGLOBIN AND HEMATOCRIT, BLOOD
HCT: 26.7 % — ABNORMAL LOW (ref 36.0–46.0)
Hemoglobin: 8.8 g/dL — ABNORMAL LOW (ref 12.0–15.0)

## 2020-03-02 MED ORDER — EPOETIN ALFA-EPBX 10000 UNIT/ML IJ SOLN
20000.0000 [IU] | Freq: Once | INTRAMUSCULAR | Status: AC
  Administered 2020-03-02: 20000 [IU] via SUBCUTANEOUS
  Filled 2020-03-02: qty 2

## 2020-03-16 ENCOUNTER — Inpatient Hospital Stay: Payer: Medicare Other

## 2020-03-16 ENCOUNTER — Other Ambulatory Visit: Payer: Self-pay

## 2020-03-16 VITALS — BP 127/58 | HR 60 | Temp 98.0°F | Resp 18

## 2020-03-16 DIAGNOSIS — N184 Chronic kidney disease, stage 4 (severe): Secondary | ICD-10-CM | POA: Diagnosis not present

## 2020-03-16 DIAGNOSIS — D631 Anemia in chronic kidney disease: Secondary | ICD-10-CM

## 2020-03-16 DIAGNOSIS — D72821 Monocytosis (symptomatic): Secondary | ICD-10-CM

## 2020-03-16 LAB — HEMOGLOBIN AND HEMATOCRIT, BLOOD
HCT: 26.4 % — ABNORMAL LOW (ref 36.0–46.0)
Hemoglobin: 8.9 g/dL — ABNORMAL LOW (ref 12.0–15.0)

## 2020-03-16 MED ORDER — EPOETIN ALFA-EPBX 40000 UNIT/ML IJ SOLN
20000.0000 [IU] | Freq: Once | INTRAMUSCULAR | Status: DC
  Administered 2020-03-16: 20000 [IU] via SUBCUTANEOUS

## 2020-03-16 MED ORDER — EPOETIN ALFA-EPBX 10000 UNIT/ML IJ SOLN: 20000.0000 [IU] | Freq: Once | INTRAMUSCULAR | Status: DC

## 2020-03-30 ENCOUNTER — Inpatient Hospital Stay: Payer: Medicare Other

## 2020-03-30 ENCOUNTER — Other Ambulatory Visit: Payer: Self-pay

## 2020-03-30 VITALS — BP 169/72 | HR 59 | Temp 96.7°F | Resp 18

## 2020-03-30 DIAGNOSIS — D72821 Monocytosis (symptomatic): Secondary | ICD-10-CM

## 2020-03-30 DIAGNOSIS — D631 Anemia in chronic kidney disease: Secondary | ICD-10-CM

## 2020-03-30 DIAGNOSIS — N184 Chronic kidney disease, stage 4 (severe): Secondary | ICD-10-CM | POA: Diagnosis not present

## 2020-03-30 LAB — HEMOGLOBIN AND HEMATOCRIT, BLOOD
HCT: 26.8 % — ABNORMAL LOW (ref 36.0–46.0)
Hemoglobin: 8.7 g/dL — ABNORMAL LOW (ref 12.0–15.0)

## 2020-03-30 MED ORDER — EPOETIN ALFA-EPBX 10000 UNIT/ML IJ SOLN
20000.0000 [IU] | Freq: Once | INTRAMUSCULAR | Status: AC
  Administered 2020-03-30: 20000 [IU] via SUBCUTANEOUS
  Filled 2020-03-30: qty 2

## 2020-04-12 NOTE — Progress Notes (Signed)
[facility]  [address], [address] [place], [place] [address] Phone: [phone]  Fax: [phone]   Clinic Day:  04/13/2020  Referring physician: Sofie Hartigan, MD  Chief Complaint: Felicia [name] is a 86 y.o. female with anemia in chronic kidney disease on Retacrit who is seen for 3 month assessment.  HPI: The patient was last seen in the hematology clinic on 01/20/2020. At that time, she felt "pretty good".  She denied any bleeding. Exam was stable. Patient received Retacrit.   Patient has received Retacrit biweekly x 5 (02/03/2020 - 03/30/2020).   She saw Dr. Holley Raring on 02/07/2020 for follow up. She had advanced renal dysfunction but stable renal function overall.  GFR was 21 ml/min. Patient will follow up in 3 months.   CBC followed: 02/03/2020: Hematocrit 28.1. Hemoglobin 9.0.  02/07/2020: Hematocrit 27.3. Hemoglobin 8.7.  02/17/2020: Hematocrit 28.1. Hemoglobin 9.0.  03/02/2020: Hematocrit 26.7. Hemoglobin 8.8.  03/16/2020: Hematocrit 26.4. Hemoglobin 8.9.  03/30/2020: Hematocrit 26.8. Hemoglobin 8.7.   During the interim, she felt "pretty good". Her energy fluctuates and today she has low energy. Reports restless legs and her left leg gets cold, tight and stiff. She notes some left leg discoloration. She is performing ADLs. She is unable to do heavy cleaning like vacuuming. She remains on omeprazole for reflux.   She is not interested in getting the COVID-19 vaccine at this time.    Past Medical History:  Diagnosis Date  . GERD (gastroesophageal reflux disease)   . HOH (hard of hearing)   . Hypertension   . Pneumonia   . Vertigo     Past Surgical History:  Procedure Laterality Date  . APPENDECTOMY  1939  . TONSILLECTOMY  1939    Family History  Problem Relation Age of Onset  . Heart disease Mother   . Goiter Mother   . Bladder Cancer Father 45    Social History:  reports that she has never smoked. She has never  used smokeless tobacco. She reports that she does not drink alcohol and does not use drugs. Has had no exposure to any radiation or toxins. Her husband started [employer] in [place]. After he passed she went to work for [employer] for 23 years. 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 She lives by herself with 2 cats named brother and sister. She lives in [place]. She has 1 son who lives in [place]. She recently had a new great granddaughter on 11/25/2019. The patient is alone today.  Allergies: No Known Allergies  Current Medications: Current Outpatient Medications  Medication Sig Dispense Refill  . albuterol (PROVENTIL HFA;VENTOLIN HFA) 108 (90 Base) MCG/ACT inhaler Inhale 2 puffs into the lungs every 6 (six) hours as needed for wheezing or shortness of breath. 1 Inhaler 0  . amLODipine (NORVASC) 10 MG tablet Take 10 mg by mouth daily.    . carvedilol (COREG) 6.25 MG tablet Take 6.25 mg by mouth 2 (two) times daily with a meal.    . cloNIDine (CATAPRES - DOSED IN MG/24 HR) 0.3 mg/24hr patch Place 0.3 mg onto the skin once a week.    . cyanocobalamin 1000 MCG tablet Take 1,000 mcg by mouth daily.    . Ferrous Sulfate (IRON) 325 (65 Fe) MG TABS Take by mouth.    Marland Kitchen omeprazole (PRILOSEC) 20 MG capsule Take 20 mg by mouth daily.    Marland Kitchen telmisartan (MICARDIS) 80 MG tablet  Take 80 mg by mouth daily.     No current facility-administered medications for this visit.    Review of Systems  Constitutional: Positive for malaise/fatigue (fluctuates ). Negative for chills, diaphoresis, fever and weight loss (up 2 lbs).       Feels "pretty good". Performing ADLs.  HENT: Negative for congestion, ear discharge, ear pain, hearing loss, nosebleeds, sinus pain, sore throat and tinnitus.   Eyes: Negative for blurred vision.  Respiratory: Negative for cough, hemoptysis, sputum production and shortness of breath.   Cardiovascular: Negative for chest pain,  palpitations and leg swelling (wearing compression stockings).  Gastrointestinal: Negative for abdominal pain, blood in stool, constipation, diarrhea, heartburn, melena, nausea and vomiting.       Reflux controlled with omeprazole.  Genitourinary: Negative for dysuria, flank pain, frequency, hematuria and urgency.       Incontinence; wears adult diapers.  Musculoskeletal: Negative for back pain, joint pain (hands), myalgias and neck pain.       Left leg tight and stiff with some discoloration.  Skin: Negative for itching and rash.  Neurological: Positive for sensory change (restless legs; twitching sensation, left leg feels cold) and weakness (generalized; needs cane). Negative for dizziness, tingling and headaches.  Endo/Heme/Allergies: Does not bruise/bleed easily.  Psychiatric/Behavioral: Negative for depression and memory loss. The patient is not nervous/anxious and does not have insomnia.   All other systems reviewed and are negative.  Performance status (ECOG): 1  Vitals Blood pressure (!) 124/54, pulse 62, temperature (!) 97.3 F (36.3 C), resp. rate 18, weight 184 lb 8.4 oz (83.7 kg), SpO2 95 %.   Physical Exam Vitals and nursing note reviewed.  Constitutional:      General: She is not in acute distress.    Appearance: She is well-developed. She is not diaphoretic.     Interventions: Face mask in place.     Comments: Elderly woman sitting comfortably in the exam room in no acute distress.  She has a cane by her side. She needs  assistance on/off exam room table.  HENT:     Head: Normocephalic and atraumatic.     Comments: Short dark gray styled hair.    Mouth/Throat:     Pharynx: No oropharyngeal exudate.  Eyes:     General: No scleral icterus.    Conjunctiva/sclera: Conjunctivae normal.     Pupils: Pupils are equal, round, and reactive to light.     Comments: Blue eyes.  Cardiovascular:     Rate and Rhythm: Normal rate and regular rhythm.     Heart sounds: Normal heart  sounds. No murmur heard.   Pulmonary:     Effort: Pulmonary effort is normal. No respiratory distress.     Breath sounds: Normal breath sounds. No wheezing or rales.  Chest:     Chest wall: No tenderness.  Abdominal:     General: Bowel sounds are normal. There is no distension.     Palpations: Abdomen is soft. There is no hepatomegaly, splenomegaly or mass.     Tenderness: There is no abdominal tenderness. There is no guarding or rebound.  Musculoskeletal:        General: No tenderness. Normal range of motion.     Cervical back: Normal range of motion and neck supple.  Lymphadenopathy:     Head:     Right side of head: No preauricular, posterior auricular or occipital adenopathy.     Left side of head: No preauricular, posterior auricular or occipital adenopathy.  Cervical: No cervical adenopathy.     Upper Body:     Right upper body: No supraclavicular or axillary adenopathy.     Left upper body: No supraclavicular or axillary adenopathy.     Lower Body: No right inguinal adenopathy. No left inguinal adenopathy.  Skin:    General: Skin is warm and dry.  Neurological:     Mental Status: She is alert and oriented to person, place, and time.  Psychiatric:        Behavior: Behavior normal.        Thought Content: Thought content normal.        Judgment: Judgment normal.     Appointment on 04/13/2020  Component Date Value Ref Range Status  . Sodium 04/13/2020 137  135 - 145 mmol/L Final  . Potassium 04/13/2020 4.8  3.5 - 5.1 mmol/L Final  . Chloride 04/13/2020 103  98 - 111 mmol/L Final  . CO2 04/13/2020 25  22 - 32 mmol/L Final  . Glucose, Bld 04/13/2020 84  70 - 99 mg/dL Final   Glucose reference range applies only to samples taken after fasting for at least 8 hours.  . BUN 04/13/2020 29* 8 - 23 mg/dL Final  . Creatinine, Ser 04/13/2020 1.84* 0.44 - 1.00 mg/dL Final  . Calcium 04/13/2020 8.5* 8.9 - 10.3 mg/dL Final  . GFR calc non Af Amer 04/13/2020 24* >60 mL/min Final    . GFR calc Af Amer 04/13/2020 28* >60 mL/min Final  . Anion gap 04/13/2020 9  5 - 15 Final   Performed at [facility], [address] [place], [place] [address]  . WBC 04/13/2020 6.2  4.0 - 10.5 K/uL Final  . RBC 04/13/2020 3.16* 3.87 - 5.11 MIL/uL Final  . Hemoglobin 04/13/2020 9.1* 12.0 - 15.0 g/dL Final  . HCT 04/13/2020 28.8* 36 - 46 % Final  . MCV 04/13/2020 91.1  80.0 - 100.0 fL Final  . MCH 04/13/2020 28.8  26.0 - 34.0 pg Final  . MCHC 04/13/2020 31.6  30.0 - 36.0 g/dL Final  . RDW 04/13/2020 15.5  11.5 - 15.5 % Final  . Platelets 04/13/2020 122* 150 - 400 K/uL Final  . nRBC 04/13/2020 0.0  0.0 - 0.2 % Final   Performed at [facility], [address], [place], [place] [address]  . Neutrophils Relative % 04/13/2020 PENDING  % Incomplete  . Neutro Abs 04/13/2020 PENDING  1.7 - 7.7 K/uL Incomplete  . Band Neutrophils 04/13/2020 PENDING  % Incomplete  . Lymphocytes Relative 04/13/2020 PENDING  % Incomplete  . Lymphs Abs 04/13/2020 PENDING  0.7 - 4.0 K/uL Incomplete  . Monocytes Relative 04/13/2020 PENDING  % Incomplete  . Monocytes Absolute 04/13/2020 PENDING  0 - 1 K/uL Incomplete  . Eosinophils Relative 04/13/2020 PENDING  % Incomplete  . Eosinophils Absolute 04/13/2020 PENDING  0 - 0 K/uL Incomplete  . Basophils Relative 04/13/2020 PENDING  % Incomplete  . Basophils Absolute 04/13/2020 PENDING  0 - 0 K/uL Incomplete  . WBC Morphology 04/13/2020 PENDING   Incomplete  . RBC Morphology 04/13/2020 PENDING   Incomplete  . Smear Review 04/13/2020 PENDING   Incomplete  . Other 04/13/2020 PENDING  % Incomplete  . nRBC 04/13/2020 PENDING  0 /100 WBC Incomplete  . Metamyelocytes Relative 04/13/2020 PENDING  % Incomplete  . Myelocytes 04/13/2020 PENDING  % Incomplete  . Promyelocytes Relative 04/13/2020 PENDING  % Incomplete  . Blasts 04/13/2020 PENDING  % Incomplete  . Immature Granulocytes 04/13/2020  PENDING  % Incomplete  . Abs Immature Granulocytes  04/13/2020 PENDING  0.00 - 0.07 K/uL Incomplete    Assessment:  Adraine Biffle is a 87 y.o. female with anemia of chronic kidney disease. Hemoglobin has ranged between 8.2 - 10.4 over the past 4 years.  Work-up on 12/03/2020revealed a hematocrit of 27.4, hemoglobin 9.2, MCV 91.3, platelets 141,000, white count 7100 with an Kitzmiller of 3799. Monocyte countwas 1299. Iron saturation was 34% with a TIBC of 194. Creatinine was 2.15 (CrCl 23 ml/min). Phosphorus was 4.4 (elevated). Calcium was 8.5 with an albumen of 3.8. PTH was 102. ANA was + with a titer of 1:320 with cytoplasmic pattern and a dsDNA of 11 (+). SPEPrevealed a poorly defined band of restricted protein mobility in the gammaglobulin region.  Work-up on01/15/2021revealed a hematocrit26.7, hemoglobin8.9, MCV91.8, platelets130,000, WBC800, [id number]. Normal studies included: B12 (700), folate(12.1), SPEP, and immunoglobulins.Kappa free light chain ratiowas1.75(0.26-1.75).Peripheral smearrevealed a normocytic anemia with mild thrombocytopenia. The morphology of the RBCs, WBCs, and platelets were within normal limits. There was no evidence of circulating blasts.  She has stage IV chronic kidney disease.  She receives Retacrit every 2 weeks (09/30/2019 - 01/06/2020).  Retacrit was increased from 10,000 units to 20,000 units on 01/06/2020 (last 03/30/2020).  She is not interested in receiving the COVID-19 vaccine at this time.   Symptomatically, she has felt "pretty good".  Exam is stable.  Plan: 1.   Labs today: CBC with diff, BMP, ferritin. 2. Normocytic anemia Hematocrit 28.8. Hemoglobin 9.1.MCV 91.1. Etiology secondary to anemia of chronic renal disease. Ferritin 622 today.    Labs reviewed.  Continue Retacrit 2000 units SQ. 3. Monocytosis Monocyte count is 1100 today.    Patient has recurrent monocytosis. 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 No current indication for treatment. 4. Retacrit today. 5.RTC every 2 weeks for labs (hematocrit/hemoglobin) and +/- Retacrit. 6.   RTC in 12 weeks for MD assessment, labs (CBC with differential, BMP, ferritin) and +/- Retacrit.  I discussed the assessment and treatment plan with the patient.  The patient was provided an opportunity to ask questions and all were answered.  The patient agreed with the plan and demonstrated an understanding of the instructions.  The patient was advised to call back if the symptoms worsen or if the condition fails to improve as anticipated.  I provided 25 minutes face-to-face time during this this encounter and > 50% was spent counseling as documented under my assessment and plan.  I provided these services from the [facility] office.   Lequita Asal, MD, PhD    04/13/2020, 2:14 PM  I, Selena Batten, am acting as scribe for [employer]. Mike Gip, MD, PhD.  I, Raeanne Deschler C. Mike Gip, MD, have reviewed the above documentation for accuracy and completeness, and I agree with the above.

## 2020-04-13 ENCOUNTER — Other Ambulatory Visit: Payer: Self-pay

## 2020-04-13 ENCOUNTER — Ambulatory Visit: Payer: Medicare Other | Admitting: Hematology and Oncology

## 2020-04-13 ENCOUNTER — Inpatient Hospital Stay: Payer: Medicare Other

## 2020-04-13 ENCOUNTER — Inpatient Hospital Stay: Payer: Medicare Other | Attending: Hematology and Oncology

## 2020-04-13 ENCOUNTER — Encounter: Payer: Self-pay | Admitting: Hematology and Oncology

## 2020-04-13 ENCOUNTER — Other Ambulatory Visit: Payer: Medicare Other

## 2020-04-13 ENCOUNTER — Ambulatory Visit: Payer: Medicare Other

## 2020-04-13 ENCOUNTER — Inpatient Hospital Stay (HOSPITAL_BASED_OUTPATIENT_CLINIC_OR_DEPARTMENT_OTHER): Payer: Medicare Other | Admitting: Hematology and Oncology

## 2020-04-13 VITALS — BP 148/51 | HR 57 | Temp 97.0°F | Resp 20

## 2020-04-13 VITALS — BP 124/54 | HR 62 | Temp 97.3°F | Resp 18 | Wt 184.5 lb

## 2020-04-13 DIAGNOSIS — D72821 Monocytosis (symptomatic): Secondary | ICD-10-CM | POA: Diagnosis not present

## 2020-04-13 DIAGNOSIS — D631 Anemia in chronic kidney disease: Secondary | ICD-10-CM | POA: Diagnosis not present

## 2020-04-13 DIAGNOSIS — N184 Chronic kidney disease, stage 4 (severe): Secondary | ICD-10-CM | POA: Insufficient documentation

## 2020-04-13 LAB — BASIC METABOLIC PANEL
Anion gap: 9 (ref 5–15)
BUN: 29 mg/dL — ABNORMAL HIGH (ref 8–23)
CO2: 25 mmol/L (ref 22–32)
Calcium: 8.5 mg/dL — ABNORMAL LOW (ref 8.9–10.3)
Chloride: 103 mmol/L (ref 98–111)
Creatinine, Ser: 1.84 mg/dL — ABNORMAL HIGH (ref 0.44–1.00)
GFR calc Af Amer: 28 mL/min — ABNORMAL LOW (ref >60)
GFR calc non Af Amer: 24 mL/min — ABNORMAL LOW (ref >60)
Glucose, Bld: 84 mg/dL (ref 70–99)
Potassium: 4.8 mmol/L (ref 3.5–5.1)
Sodium: 137 mmol/L (ref 135–145)

## 2020-04-13 LAB — CBC WITH DIFFERENTIAL/PLATELET
Abs Immature Granulocytes: 0.38 10*3/uL — ABNORMAL HIGH (ref 0.00–0.07)
Basophils Absolute: 0 10*3/uL (ref 0.0–0.1)
Basophils Relative: 1 %
Eosinophils Absolute: 0 10*3/uL (ref 0.0–0.5)
Eosinophils Relative: 1 %
HCT: 28.8 % — ABNORMAL LOW (ref 36.0–46.0)
Hemoglobin: 9.1 g/dL — ABNORMAL LOW (ref 12.0–15.0)
Immature Granulocytes: 6 %
Lymphocytes Relative: 32 %
Lymphs Abs: 2 10*3/uL (ref 0.7–4.0)
MCH: 28.8 pg (ref 26.0–34.0)
MCHC: 31.6 g/dL (ref 30.0–36.0)
MCV: 91.1 fL (ref 80.0–100.0)
Monocytes Absolute: 1.1 10*3/uL — ABNORMAL HIGH (ref 0.1–1.0)
Monocytes Relative: 18 %
Neutro Abs: 2.7 10*3/uL (ref 1.7–7.7)
Neutrophils Relative %: 42 %
Platelets: 122 10*3/uL — ABNORMAL LOW (ref 150–400)
RBC: 3.16 MIL/uL — ABNORMAL LOW (ref 3.87–5.11)
RDW: 15.5 % (ref 11.5–15.5)
WBC: 6.2 10*3/uL (ref 4.0–10.5)
nRBC: 0 % (ref 0.0–0.2)

## 2020-04-13 LAB — FERRITIN: Ferritin: 622 ng/mL — ABNORMAL HIGH (ref 11–307)

## 2020-04-13 MED ORDER — EPOETIN ALFA-EPBX 10000 UNIT/ML IJ SOLN
20000.0000 [IU] | Freq: Once | INTRAMUSCULAR | Status: AC
  Administered 2020-04-13: 20000 [IU] via SUBCUTANEOUS
  Filled 2020-04-13: qty 2

## 2020-04-13 NOTE — Progress Notes (Signed)
No new changes noted today 

## 2020-04-19 ENCOUNTER — Ambulatory Visit
Admission: RE | Admit: 2020-04-19 | Discharge: 2020-04-19 | Disposition: A | Discharge status: Home / Self Care | Payer: Medicare Other | Source: Ambulatory Visit | Attending: Cardiology | Admitting: Cardiology

## 2020-04-19 ENCOUNTER — Other Ambulatory Visit: Payer: Self-pay

## 2020-04-19 ENCOUNTER — Other Ambulatory Visit: Payer: Self-pay | Admitting: Cardiology

## 2020-04-19 DIAGNOSIS — M7989 Other specified soft tissue disorders: Secondary | ICD-10-CM | POA: Diagnosis present

## 2020-04-19 DIAGNOSIS — M79604 Pain in right leg: Secondary | ICD-10-CM | POA: Diagnosis not present

## 2020-04-19 DIAGNOSIS — M79605 Pain in left leg: Secondary | ICD-10-CM | POA: Diagnosis present

## 2020-04-27 ENCOUNTER — Inpatient Hospital Stay: Payer: Medicare Other

## 2020-04-27 ENCOUNTER — Other Ambulatory Visit: Payer: Self-pay

## 2020-04-27 VITALS — BP 115/59 | HR 60 | Resp 18

## 2020-04-27 DIAGNOSIS — N184 Chronic kidney disease, stage 4 (severe): Secondary | ICD-10-CM | POA: Diagnosis not present

## 2020-04-27 DIAGNOSIS — D631 Anemia in chronic kidney disease: Secondary | ICD-10-CM

## 2020-04-27 LAB — HEMOGLOBIN AND HEMATOCRIT, BLOOD
HCT: 29.4 % — ABNORMAL LOW (ref 36.0–46.0)
Hemoglobin: 9.4 g/dL — ABNORMAL LOW (ref 12.0–15.0)

## 2020-04-27 MED ORDER — EPOETIN ALFA-EPBX 10000 UNIT/ML IJ SOLN
20000.0000 [IU] | Freq: Once | INTRAMUSCULAR | Status: AC
  Administered 2020-04-27: 20000 [IU] via SUBCUTANEOUS
  Filled 2020-04-27: qty 2

## 2020-05-11 ENCOUNTER — Inpatient Hospital Stay: Payer: Medicare Other | Attending: Hematology and Oncology

## 2020-05-11 ENCOUNTER — Other Ambulatory Visit: Payer: Self-pay

## 2020-05-11 ENCOUNTER — Inpatient Hospital Stay: Payer: Medicare Other

## 2020-05-11 VITALS — BP 149/68 | HR 58 | Resp 18

## 2020-05-11 DIAGNOSIS — N184 Chronic kidney disease, stage 4 (severe): Secondary | ICD-10-CM | POA: Insufficient documentation

## 2020-05-11 DIAGNOSIS — D631 Anemia in chronic kidney disease: Secondary | ICD-10-CM

## 2020-05-11 LAB — HEMOGLOBIN AND HEMATOCRIT, BLOOD
HCT: 28.7 % — ABNORMAL LOW (ref 36.0–46.0)
Hemoglobin: 9.2 g/dL — ABNORMAL LOW (ref 12.0–15.0)

## 2020-05-11 MED ORDER — EPOETIN ALFA-EPBX 10000 UNIT/ML IJ SOLN
20000.0000 [IU] | Freq: Once | INTRAMUSCULAR | Status: AC
  Administered 2020-05-11: 20000 [IU] via SUBCUTANEOUS
  Filled 2020-05-11: qty 2

## 2020-05-25 ENCOUNTER — Other Ambulatory Visit: Payer: Self-pay

## 2020-05-25 ENCOUNTER — Inpatient Hospital Stay: Payer: Medicare Other

## 2020-05-25 VITALS — BP 110/59 | HR 60 | Temp 97.4°F | Resp 18

## 2020-05-25 DIAGNOSIS — N184 Chronic kidney disease, stage 4 (severe): Secondary | ICD-10-CM | POA: Diagnosis not present

## 2020-05-25 DIAGNOSIS — D631 Anemia in chronic kidney disease: Secondary | ICD-10-CM

## 2020-05-25 LAB — HEMOGLOBIN AND HEMATOCRIT, BLOOD
HCT: 28.4 % — ABNORMAL LOW (ref 36.0–46.0)
Hemoglobin: 9.3 g/dL — ABNORMAL LOW (ref 12.0–15.0)

## 2020-05-25 MED ORDER — EPOETIN ALFA-EPBX 10000 UNIT/ML IJ SOLN
20000.0000 [IU] | Freq: Once | INTRAMUSCULAR | Status: AC
  Administered 2020-05-25: 20000 [IU] via SUBCUTANEOUS

## 2020-06-08 ENCOUNTER — Inpatient Hospital Stay: Payer: Medicare Other

## 2020-06-08 ENCOUNTER — Inpatient Hospital Stay: Payer: Medicare Other | Attending: Hematology and Oncology

## 2020-06-08 DIAGNOSIS — D631 Anemia in chronic kidney disease: Secondary | ICD-10-CM | POA: Insufficient documentation

## 2020-06-08 DIAGNOSIS — N184 Chronic kidney disease, stage 4 (severe): Secondary | ICD-10-CM | POA: Insufficient documentation

## 2020-06-22 ENCOUNTER — Inpatient Hospital Stay: Payer: Medicare Other

## 2020-06-22 ENCOUNTER — Other Ambulatory Visit: Payer: Self-pay

## 2020-06-22 VITALS — BP 99/82 | HR 72 | Resp 18

## 2020-06-22 DIAGNOSIS — D631 Anemia in chronic kidney disease: Secondary | ICD-10-CM

## 2020-06-22 DIAGNOSIS — N184 Chronic kidney disease, stage 4 (severe): Secondary | ICD-10-CM

## 2020-06-22 LAB — HEMOGLOBIN AND HEMATOCRIT, BLOOD
HCT: 29 % — ABNORMAL LOW (ref 36.0–46.0)
Hemoglobin: 9.2 g/dL — ABNORMAL LOW (ref 12.0–15.0)

## 2020-06-22 MED ORDER — EPOETIN ALFA-EPBX 10000 UNIT/ML IJ SOLN
20000.0000 [IU] | Freq: Once | INTRAMUSCULAR | Status: AC
  Administered 2020-06-22: 20000 [IU] via SUBCUTANEOUS
  Filled 2020-06-22: qty 2

## 2020-07-06 ENCOUNTER — Inpatient Hospital Stay: Payer: Medicare Other | Admitting: Hematology and Oncology

## 2020-07-06 ENCOUNTER — Inpatient Hospital Stay: Payer: Medicare Other

## 2020-07-06 ENCOUNTER — Telehealth: Payer: Self-pay | Admitting: Hematology and Oncology

## 2020-07-06 NOTE — Telephone Encounter (Signed)
  OK to delete today's encounter.  Note moved.  M

## 2020-07-06 NOTE — Telephone Encounter (Signed)
Felicia [name] called and said she was not feeling well and very disoriented and cannot come in for appt. I rescheduled her to Monday 07/10/20.

## 2020-07-10 ENCOUNTER — Other Ambulatory Visit: Payer: Self-pay

## 2020-07-10 ENCOUNTER — Inpatient Hospital Stay: Payer: Medicare Other | Attending: Hematology and Oncology

## 2020-07-10 ENCOUNTER — Inpatient Hospital Stay (HOSPITAL_BASED_OUTPATIENT_CLINIC_OR_DEPARTMENT_OTHER): Payer: Medicare Other | Admitting: Hematology and Oncology

## 2020-07-10 ENCOUNTER — Ambulatory Visit: Payer: Medicare Other

## 2020-07-10 ENCOUNTER — Encounter: Payer: Self-pay | Admitting: Hematology and Oncology

## 2020-07-10 VITALS — BP 101/56 | HR 77 | Temp 97.6°F | Wt 196.7 lb

## 2020-07-10 DIAGNOSIS — D72821 Monocytosis (symptomatic): Secondary | ICD-10-CM

## 2020-07-10 DIAGNOSIS — N184 Chronic kidney disease, stage 4 (severe): Secondary | ICD-10-CM | POA: Insufficient documentation

## 2020-07-10 DIAGNOSIS — D631 Anemia in chronic kidney disease: Secondary | ICD-10-CM | POA: Insufficient documentation

## 2020-07-10 LAB — CBC WITH DIFFERENTIAL/PLATELET
Abs Immature Granulocytes: 0.4 10*3/uL — ABNORMAL HIGH (ref 0.00–0.07)
Band Neutrophils: 3 %
Basophils Absolute: 0 10*3/uL (ref 0.0–0.1)
Basophils Relative: 0 %
Eosinophils Absolute: 0 10*3/uL (ref 0.0–0.5)
Eosinophils Relative: 0 %
HCT: 25.7 % — ABNORMAL LOW (ref 36.0–46.0)
Hemoglobin: 8 g/dL — ABNORMAL LOW (ref 12.0–15.0)
Lymphocytes Relative: 26 %
Lymphs Abs: 2 10*3/uL (ref 0.7–4.0)
MCH: 29.7 pg (ref 26.0–34.0)
MCHC: 31.1 g/dL (ref 30.0–36.0)
MCV: 95.5 fL (ref 80.0–100.0)
Metamyelocytes Relative: 1 %
Monocytes Absolute: 1.2 10*3/uL — ABNORMAL HIGH (ref 0.1–1.0)
Monocytes Relative: 16 %
Myelocytes: 4 %
Neutro Abs: 4.1 10*3/uL (ref 1.7–7.7)
Neutrophils Relative %: 50 %
Platelets: 162 10*3/uL (ref 150–400)
RBC Morphology: NONE SEEN
RBC: 2.69 MIL/uL — ABNORMAL LOW (ref 3.87–5.11)
RDW: 17.3 % — ABNORMAL HIGH (ref 11.5–15.5)
WBC: 7.7 10*3/uL (ref 4.0–10.5)
nRBC: 0 % (ref 0.0–0.2)

## 2020-07-10 LAB — BASIC METABOLIC PANEL
Anion gap: 11 (ref 5–15)
BUN: 47 mg/dL — ABNORMAL HIGH (ref 8–23)
CO2: 21 mmol/L — ABNORMAL LOW (ref 22–32)
Calcium: 8.5 mg/dL — ABNORMAL LOW (ref 8.9–10.3)
Chloride: 105 mmol/L (ref 98–111)
Creatinine, Ser: 2.23 mg/dL — ABNORMAL HIGH (ref 0.44–1.00)
GFR, Estimated: 21 mL/min — ABNORMAL LOW (ref >60)
Glucose, Bld: 106 mg/dL — ABNORMAL HIGH (ref 70–99)
Potassium: 4.3 mmol/L (ref 3.5–5.1)
Sodium: 137 mmol/L (ref 135–145)

## 2020-07-10 MED ORDER — EPOETIN ALFA-EPBX 10000 UNIT/ML IJ SOLN
20000.0000 [IU] | Freq: Once | INTRAMUSCULAR | Status: AC
  Administered 2020-07-10: 20000 [IU] via SUBCUTANEOUS
  Filled 2020-07-10: qty 2

## 2020-07-10 NOTE — Progress Notes (Signed)
[facility]  [address] [place], [place] [address] Phone: [phone]  Fax: [phone]   Clinic Day:  07/10/2020  Referring physician: Sofie Hartigan, MD  Chief Complaint: Felicia [name] is a 86 y.o. female with anemia in chronic kidney disease on Retacrit who is seen for 12 week assessment.  HPI: The patient was last seen in the hematology clinic on 04/13/2020. At that time, she felt "pretty good".  Exam was stable. Hematocrit was 28.8, hemoglobin 9.1, MCV 91.1, platelets 122,000, WBC 6,200. BUN was 29. Creatinine was 1.84 (CrCl 24 ml/min). Calcium was 8.5. Ferritin was 622. She received Retacrit 20,000 units.  The patient saw Hassell Done, NP (cardiology) on 04/19/2020. She reported fatigue, lower extremity swelling, and left calf tenderness. Lower extremity ultrasound was negative for DVT but did show Baker's cysts in the bilateral popliteal fossas. She was referred to orthopedic surgery.  The patient saw Dr. Rogers Blocker in the [facility] on 05/01/2020.  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  A 12 day steroid Dosepack was prescribed.  There was consideration of future cortisone injection in the left knee and possible referral to neurology for EMG if she had no improvement after the injection.  Follow-up was planned in 2 weeks.   The patient saw Dr. Holley Raring on 05/10/2020. Her chronic kidney disease remained advanced but stable. She continued Micardis. Follow up was planned for 4 months.  Labs followed: 04/27/2020: Hematocrit 29.4. Hemoglobin 9.4. 05/11/2020: Hematocrit 28.7. Hemoglobin 9.2. 05/25/2020: Hematocrit 28.4. Hemoglobin 9.3. 06/22/2020: Hematocrit 29.0. Hemoglobin 9.2.  The patient received Retacrit 20,000 units biweekly (04/27/2020 - 05/25/2020; 06/22/2020).  During the interim, she "has been better." She is feeling fatigued and had diarrhea all last  week. She is occasionally short of breath. The problems that she was having with her knees have resolved. She has not been eating well but she does drink Boost/Ensure at least once daily. She spends a lot of time sitting in her chair. She denies bleeding of any kind.   Past Medical History:  Diagnosis Date  . GERD (gastroesophageal reflux disease)   . HOH (hard of hearing)   . Hypertension   . Pneumonia   . Vertigo     Past Surgical History:  Procedure Laterality Date  . APPENDECTOMY  1939  . TONSILLECTOMY  1939    Family History  Problem Relation Age of Onset  . Heart disease Mother   . Goiter Mother   . Bladder Cancer Father 42    Social History:  reports that she has never smoked. She has never used smokeless tobacco. She reports that she does not drink alcohol and does not use drugs. Has had no exposure to any radiation or toxins. Her husband started [facility] in [place]. After he passed she went to work for [employer] for 23 years. 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 She lives by herself with 2 cats named brother and sister. She lives in [place]. She has 1 son who lives in [place]. She recently had a new great granddaughter on 11/25/2019. The patient is alone today.  Allergies: No Known Allergies  Current Medications: Current Outpatient Medications  Medication Sig Dispense Refill  . albuterol (PROVENTIL HFA;VENTOLIN HFA) 108 (90 Base) MCG/ACT inhaler Inhale 2 puffs into the lungs every 6 (six) hours as needed for wheezing or shortness of  breath. 1 Inhaler 0  . amLODipine (NORVASC) 10 MG tablet Take 10 mg by mouth daily.    . carvedilol (COREG) 6.25 MG tablet Take 6.25 mg by mouth 2 (two) times daily with a meal.    . cloNIDine (CATAPRES - DOSED IN MG/24 HR) 0.3 mg/24hr patch Place 0.3 mg onto the skin once a week.    . cyanocobalamin 1000 MCG tablet Take 1,000 mcg by mouth daily.    . Ferrous Sulfate (IRON) 325  (65 Fe) MG TABS Take by mouth.    Marland Kitchen omeprazole (PRILOSEC) 20 MG capsule Take 20 mg by mouth daily.    Marland Kitchen telmisartan (MICARDIS) 80 MG tablet Take 80 mg by mouth daily.     No current facility-administered medications for this visit.    Review of Systems  Constitutional: Positive for malaise/fatigue. Negative for chills, diaphoresis, fever and weight loss (up 12 lbs).       "I've been better".  HENT: Negative for congestion, ear discharge, ear pain, hearing loss, nosebleeds, sinus pain, sore throat and tinnitus.   Eyes: Negative for blurred vision.  Respiratory: Positive for shortness of breath. Negative for cough, hemoptysis and sputum production.   Cardiovascular: Negative for chest pain, palpitations and leg swelling.  Gastrointestinal: Positive for diarrhea (last week). Negative for abdominal pain, blood in stool, constipation, heartburn, melena, nausea and vomiting.       Not eating well. Drinks Boost/Ensure at least once daily.  Genitourinary: Negative for dysuria, flank pain, frequency, hematuria and urgency.  Musculoskeletal: Negative for back pain, joint pain, myalgias and neck pain.       No problems with knees.  Skin: Negative for itching and rash.  Neurological: Positive for weakness (generalized). Negative for dizziness, tingling, sensory change and headaches.  Endo/Heme/Allergies: Does not bruise/bleed easily.  Psychiatric/Behavioral: Negative for depression and memory loss. The patient is not nervous/anxious and does not have insomnia.   All other systems reviewed and are negative.  Performance status (ECOG):  2  Vitals Blood pressure (!) 101/56, pulse 77, temperature 97.6 F (36.4 C), temperature source Tympanic, weight 196 lb 10.4 oz (89.2 kg), SpO2 96 %.   Physical Exam Vitals and nursing note reviewed.  Constitutional:      General: She is not in acute distress.    Appearance: She is well-developed. She is not diaphoretic.     Interventions: Face mask in place.      Comments: Elderly woman sitting comfortably in the exam room in no acute distress.  She has a rolling walker by her side. She needs  assistance on/off exam room table.  HENT:     Head: Normocephalic and atraumatic.     Comments: Short dark gray styled hair.    Mouth/Throat:     Mouth: Mucous membranes are moist.     Pharynx: Oropharynx is clear. No oropharyngeal exudate.  Eyes:     General: No scleral icterus.    Extraocular Movements: Extraocular movements intact.     Conjunctiva/sclera: Conjunctivae normal.     Pupils: Pupils are equal, round, and reactive to light.     Comments: Blue eyes.  Cardiovascular:     Rate and Rhythm: Normal rate and regular rhythm.     Heart sounds: Normal heart sounds. No murmur heard.   Pulmonary:     Effort: Pulmonary effort is normal. No respiratory distress.     Breath sounds: Normal breath sounds. No wheezing or rales.  Chest:     Chest wall: No tenderness.  Abdominal:  General: Bowel sounds are normal. There is no distension.     Palpations: Abdomen is soft. There is no hepatomegaly, splenomegaly or mass.     Tenderness: There is no abdominal tenderness. There is no guarding or rebound.  Musculoskeletal:        General: No tenderness. Normal range of motion.     Cervical back: Normal range of motion and neck supple.     Right lower leg: Edema (trace) present.  Lymphadenopathy:     Head:     Right side of head: No preauricular, posterior auricular or occipital adenopathy.     Left side of head: No preauricular, posterior auricular or occipital adenopathy.     Cervical: No cervical adenopathy.     Upper Body:     Right upper body: No supraclavicular or axillary adenopathy.     Left upper body: No supraclavicular or axillary adenopathy.     Lower Body: No right inguinal adenopathy. No left inguinal adenopathy.  Skin:    General: Skin is warm and dry.  Neurological:     Mental Status: She is alert and oriented to person, place, and time.   Psychiatric:        Behavior: Behavior normal.        Thought Content: Thought content normal.        Judgment: Judgment normal.    Appointment on 07/10/2020  Component Date Value Ref Range Status  . WBC 07/10/2020 7.7  4.0 - 10.5 K/uL Final  . RBC 07/10/2020 2.69* 3.87 - 5.11 MIL/uL Final  . Hemoglobin 07/10/2020 8.0* 12.0 - 15.0 g/dL Final  . HCT 07/10/2020 25.7* 36 - 46 % Final  . MCV 07/10/2020 95.5  80.0 - 100.0 fL Final  . MCH 07/10/2020 29.7  26.0 - 34.0 pg Final  . MCHC 07/10/2020 31.1  30.0 - 36.0 g/dL Final  . RDW 07/10/2020 17.3* 11.5 - 15.5 % Final  . Platelets 07/10/2020 162  150 - 400 K/uL Final  . nRBC 07/10/2020 0.0  0.0 - 0.2 % Final  . Neutrophils Relative % 07/10/2020 50  % Final  . Neutro Abs 07/10/2020 4.1  1.7 - 7.7 K/uL Final  . Band Neutrophils 07/10/2020 3  % Final  . Lymphocytes Relative 07/10/2020 26  % Final  . Lymphs Abs 07/10/2020 2.0  0.7 - 4.0 K/uL Final  . Monocytes Relative 07/10/2020 16  % Final  . Monocytes Absolute 07/10/2020 1.2* 0.1 - 1.0 K/uL Final  . Eosinophils Relative 07/10/2020 0  % Final  . Eosinophils Absolute 07/10/2020 0.0  0.0 - 0.5 K/uL Final  . Basophils Relative 07/10/2020 0  % Final  . Basophils Absolute 07/10/2020 0.0  0.0 - 0.1 K/uL Final  . RBC Morphology 07/10/2020 NO SCHISTOCYTES SEEN   Final  . Smear Review 07/10/2020 Reviewed   Final  . Metamyelocytes Relative 07/10/2020 1  % Final  . Myelocytes 07/10/2020 4  % Final  . Abs Immature Granulocytes 07/10/2020 0.40* 0.00 - 0.07 K/uL Final  . Rouleaux 07/10/2020 PRESENT   Final  . Ovalocytes 07/10/2020 PRESENT   Final  . Stomatocytes 07/10/2020 PRESENT   Final   Performed at [facility], [address] [place], [place] [address]  . Sodium 07/10/2020 137  135 - 145 mmol/L Final  . Potassium 07/10/2020 4.3  3.5 - 5.1 mmol/L Final  . Chloride 07/10/2020 105  98 - 111 mmol/L Final  . CO2 07/10/2020 21* 22 - 32 mmol/L Final  . Glucose, Bld  07/10/2020 106* 70  - 99 mg/dL Final   Glucose reference range applies only to samples taken after fasting for at least 8 hours.  . BUN 07/10/2020 47* 8 - 23 mg/dL Final  . Creatinine, Ser 07/10/2020 2.23* 0.44 - 1.00 mg/dL Final  . Calcium 07/10/2020 8.5* 8.9 - 10.3 mg/dL Final  . GFR, Estimated 07/10/2020 21* >60 mL/min Final   Comment: (NOTE) Calculated using the CKD-EPI Creatinine Equation (2021)   . Anion gap 07/10/2020 11  5 - 15 Final   Performed at [facility], [address], [place], [place] [address]    Assessment:  Felicia [name] is a 87 y.o. female with anemia of chronic kidney disease. Hemoglobin has ranged between 8.2 - 10.4 over the past 4 years.  Work-up on 12/03/2020revealed a hematocrit of 27.4, hemoglobin 9.2, MCV 91.3, platelets 141,000, white count 7100 with an [place] of 3799. Monocyte countwas 1299. Iron saturation was 34% with a TIBC of 194. Creatinine was 2.15 (CrCl 23 ml/min). Phosphorus was 4.4 (elevated). Calcium was 8.5 with an albumen of 3.8. PTH was 102. ANA was + with a titer of 1:320 with cytoplasmic pattern and a dsDNA of 11 (+). SPEPrevealed a poorly defined band of restricted protein mobility in the gammaglobulin region.  Work-up on01/15/2021revealed a hematocrit26.7, hemoglobin8.9, MCV91.8, platelets130,000, WBC800, [id number]. Normal studies included: B12 (700), folate(12.1), SPEP, and immunoglobulins.Kappa free light chain ratiowas1.75(0.26-1.75).Peripheral smearrevealed a normocytic anemia with mild thrombocytopenia. The morphology of the RBCs, WBCs, and platelets were within normal limits. There was no evidence of circulating blasts.  She has stage IV chronic kidney disease.  She receives Retacrit every 2 weeks (09/30/2019 - 01/06/2020).  Retacrit was increased from 10,000 units to 20,000 units on 01/06/2020 (last 06/22/2020).  She is not interested in receiving the COVID-19 vaccine at this time.    Symptomatically, she "has been better." She feels fatigued; she had diarrhea all last week. She is occasionally short of breath. She has not been eating well but she does drink Boost/Ensure at least once daily. She spends a lot of time sitting in her chair. She denies any bleeding.  Exam Is stable.  Plan: 1.   Labs today: CBC with differential, BMP, ferritin. 2. Anemia of chronic renal disease Hematocrit 25.7. Hemoglobin 8.0.MCV 85.5. Etiology secondary to anemia of chronic renal disease. Ferritin 456 today.    Discuss concern about drop in hemoglobin.     Plan to check labs next week and possible transfusion.    Labs reviewed.  Retacrit 20,000 units SQ today. 3. Monocytosis Monocyte count is 1200 today.    She has persistent monocytosis. 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 Continue to monitor without treatment. 4. Retacrit today. 5.   RTC in 1 week for labs (CBC, hold tube) and +/- transfusion next day. 6.   RTC every 2 weeks for labs (hematocrit/hemoglobin) and +/- Retacrit. 7.   RTC in 12 weeks for MD assessment, labs (CBC with differential, BMP, ferritin) and +/- Retacrit.  I discussed the assessment and treatment plan with the patient.  The patient was provided an opportunity to ask questions and all were answered.  The patient agreed with the plan and demonstrated an understanding of the instructions.  The patient was advised to call back if the symptoms worsen or if the condition fails to improve as anticipated.   Lequita Asal, MD, PhD    07/10/2020, 2:57 PM  I, Mirian Mo Tufford,  am acting as Education administrator for [employer] C. Mike Gip, MD, PhD.  I, Levester Waldridge C. Mike Gip, MD, have reviewed the above documentation for accuracy and completeness, and I agree with the above.

## 2020-07-10 NOTE — Progress Notes (Signed)
Patient reports feeling very weak and tired. She also reports feeling very cold. She reports some diarrhea over the weekend but states it has subsided.

## 2020-07-11 LAB — FERRITIN: Ferritin: 456 ng/mL — ABNORMAL HIGH (ref 11–307)

## 2020-07-17 ENCOUNTER — Other Ambulatory Visit: Payer: Self-pay

## 2020-07-17 ENCOUNTER — Inpatient Hospital Stay: Payer: Medicare Other

## 2020-07-17 DIAGNOSIS — N184 Chronic kidney disease, stage 4 (severe): Secondary | ICD-10-CM

## 2020-07-17 DIAGNOSIS — J9601 Acute respiratory failure with hypoxia: Secondary | ICD-10-CM | POA: Diagnosis not present

## 2020-07-17 DIAGNOSIS — I130 Hypertensive heart and chronic kidney disease with heart failure and stage 1 through stage 4 chronic kidney disease, or unspecified chronic kidney disease: Secondary | ICD-10-CM | POA: Diagnosis not present

## 2020-07-17 DIAGNOSIS — D631 Anemia in chronic kidney disease: Secondary | ICD-10-CM

## 2020-07-17 LAB — CBC WITH DIFFERENTIAL/PLATELET
Abs Immature Granulocytes: 0.51 10*3/uL — ABNORMAL HIGH (ref 0.00–0.07)
Basophils Absolute: 0 10*3/uL (ref 0.0–0.1)
Basophils Relative: 0 %
Eosinophils Absolute: 0 10*3/uL (ref 0.0–0.5)
Eosinophils Relative: 1 %
HCT: 27.7 % — ABNORMAL LOW (ref 36.0–46.0)
Hemoglobin: 8.4 g/dL — ABNORMAL LOW (ref 12.0–15.0)
Immature Granulocytes: 7 %
Lymphocytes Relative: 22 %
Lymphs Abs: 1.6 10*3/uL (ref 0.7–4.0)
MCH: 29.4 pg (ref 26.0–34.0)
MCHC: 30.3 g/dL (ref 30.0–36.0)
MCV: 96.9 fL (ref 80.0–100.0)
Monocytes Absolute: 1.4 10*3/uL — ABNORMAL HIGH (ref 0.1–1.0)
Monocytes Relative: 19 %
Neutro Abs: 3.6 10*3/uL (ref 1.7–7.7)
Neutrophils Relative %: 51 %
Platelets: 158 10*3/uL (ref 150–400)
RBC: 2.86 MIL/uL — ABNORMAL LOW (ref 3.87–5.11)
RDW: 17.7 % — ABNORMAL HIGH (ref 11.5–15.5)
Smear Review: NORMAL
WBC: 7.1 10*3/uL (ref 4.0–10.5)
nRBC: 0 % (ref 0.0–0.2)

## 2020-07-17 LAB — SAMPLE TO BLOOD BANK

## 2020-07-17 NOTE — Progress Notes (Signed)
[facility]  [address], [address] [place], [place] [address] Phone: [phone]  Fax: [phone]   Clinic Day:  07/18/2020  Referring physician: Sofie Hartigan, MD  Chief Complaint: Felicia [name] is a 86 y.o. female with anemia in chronic kidney disease on Retacrit who is seen for 1 week assessment.  HPI: The patient was last seen in the hematology clinic on 07/10/2020. At that time, she felt fatigued.  She described diarrhea the week prior.  She was not eating well.  Hemoglobin had dropped unexpectantly from the 9.2-9.4 range to 8.0.  She denied any bleeding. Creatinine was 2.23 (CrCl 21 ml/min).  Ferritin was 456. She received Retacrit 20,000 units.  CBC on 07/17/2020 revealed a hematocrit of 27.7, hemoglobin 8.4, MCV 96.9, platelets 158,000, WBC 7,100.  During the interim, she has been very short of breath. She sleeps in a reclining chair but this is normal for her. She states that she has been short of breath since she got sick.  She had a lot of leg swelling yesterday, though this has improved today. She reports chills and stays cold. She has been urinating less than usual. She denies fevers, pleuritic chest pain, palpitations, nausea, vomiting, diarrhea, and bleeding of any kind.   Her appetite is poor. She eats an apple for breakfast, drinks Boost for lunch, and eats a sandwich or soup for dinner.    Past Medical History:  Diagnosis Date  . GERD (gastroesophageal reflux disease)   . HOH (hard of hearing)   . Hypertension   . Pneumonia   . Vertigo     Past Surgical History:  Procedure Laterality Date  . APPENDECTOMY  1939  . TONSILLECTOMY  1939    Family History  Problem Relation Age of Onset  . Heart disease Mother   . Goiter Mother   . Bladder Cancer Father 21    Social History:  reports that she has never smoked. She has never used smokeless tobacco. She reports that she does not drink alcohol and does not use drugs.  Has had no exposure to any radiation or toxins. Her husband started [facility] in [place]. After he passed she went to work for [employer] for 23 years. 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 She lives by herself with 2 cats named brother and sister. She lives in [place]. She has 1 son who lives in [place]. She recently had a new great granddaughter on 11/25/2019. The patient is accompanied by her neighbor, Marlowe Kays, today.  Allergies: No Known Allergies  Current Medications: Current Outpatient Medications  Medication Sig Dispense Refill  . albuterol (PROVENTIL HFA;VENTOLIN HFA) 108 (90 Base) MCG/ACT inhaler Inhale 2 puffs into the lungs every 6 (six) hours as needed for wheezing or shortness of breath. 1 Inhaler 0  . amLODipine (NORVASC) 10 MG tablet Take 10 mg by mouth daily.    . carvedilol (COREG) 6.25 MG tablet Take 6.25 mg by mouth 2 (two) times daily with a meal.    . cloNIDine (CATAPRES - DOSED IN MG/24 HR) 0.3 mg/24hr patch Place 0.3 mg onto the skin once a week.    . cyanocobalamin 1000 MCG tablet Take 1,000 mcg by mouth daily.    . Ferrous Sulfate (IRON) 325 (65 Fe) MG TABS Take by mouth.    Marland Kitchen omeprazole (PRILOSEC) 20 MG capsule Take 20 mg by mouth daily.    Marland Kitchen telmisartan (MICARDIS) 80  MG tablet Take 80 mg by mouth daily.     No current facility-administered medications for this visit.    Review of Systems  Constitutional: Positive for chills. Negative for diaphoresis, fever, malaise/fatigue and weight loss (up 6 lbs).       Stays cold.  HENT: Negative for congestion, ear discharge, ear pain, hearing loss, nosebleeds, sinus pain, sore throat and tinnitus.   Eyes: Negative for blurred vision.  Respiratory: Positive for shortness of breath. Negative for cough, hemoptysis and sputum production.   Cardiovascular: Positive for leg swelling. Negative for chest pain and palpitations.  Gastrointestinal: Negative for abdominal  pain, blood in stool, constipation, diarrhea, heartburn, melena, nausea and vomiting.       Not eating well. Drinks Boost/Ensure at least once daily.  Genitourinary: Negative for dysuria, flank pain, frequency, hematuria and urgency.  Musculoskeletal: Negative for back pain, joint pain, myalgias and neck pain.  Skin: Negative for itching and rash.  Neurological: Positive for weakness (generalized). Negative for dizziness, tingling, sensory change and headaches.  Endo/Heme/Allergies: Does not bruise/bleed easily.  Psychiatric/Behavioral: Negative for depression and memory loss. The patient is not nervous/anxious and does not have insomnia.   All other systems reviewed and are negative.  Performance status (ECOG): 2  Vitals Blood pressure 115/73, pulse (!) 109, temperature 97.8 F (36.6 C), temperature source Tympanic, resp. rate 20, height 5\' 9"  (1.753 m), weight 202 lb 6.1 oz (91.8 kg), SpO2 94 %.   Physical Exam Vitals and nursing note reviewed.  Constitutional:      General: She is not in acute distress.    Appearance: She is well-developed. She is not diaphoretic.     Interventions: Face mask in place.     Comments: She has a rolling walker by her side. She needs  assistance on/off exam room table.  HENT:     Head: Normocephalic and atraumatic.     Comments: Short dark gray styled hair.    Mouth/Throat:     Mouth: Mucous membranes are moist.     Pharynx: Oropharynx is clear. No oropharyngeal exudate.  Eyes:     General: No scleral icterus.    Extraocular Movements: Extraocular movements intact.     Conjunctiva/sclera: Conjunctivae normal.     Pupils: Pupils are equal, round, and reactive to light.     Comments: Blue eyes.  Neck:     Vascular: JVD present.  Cardiovascular:     Rate and Rhythm: Normal rate. Rhythm irregular.     Heart sounds: Normal heart sounds. No murmur heard.   Pulmonary:     Effort: Pulmonary effort is normal.     Breath sounds: No rales.      Comments: Decreased breath sounds at bases. Chest:     Chest wall: No tenderness.  Abdominal:     General: Bowel sounds are normal. There is no distension.     Palpations: Abdomen is soft. There is no hepatomegaly, splenomegaly or mass.     Tenderness: There is no abdominal tenderness. There is no guarding or rebound.  Musculoskeletal:        General: Tenderness (calves (R>L)) present. Normal range of motion.     Cervical back: Normal range of motion and neck supple.     Right lower leg: Edema (R>L) present.     Left lower leg: Edema present.     Comments: Negative Homans sign  Lymphadenopathy:     Head:     Right side of head: No preauricular, posterior auricular or  occipital adenopathy.     Left side of head: No preauricular, posterior auricular or occipital adenopathy.     Cervical: No cervical adenopathy.     Upper Body:     Right upper body: No supraclavicular or axillary adenopathy.     Left upper body: No supraclavicular or axillary adenopathy.     Lower Body: No right inguinal adenopathy. No left inguinal adenopathy.  Skin:    General: Skin is warm and dry.  Neurological:     Mental Status: She is alert and oriented to person, place, and time.  Psychiatric:        Behavior: Behavior normal.        Thought Content: Thought content normal.        Judgment: Judgment normal.    Appointment on 07/17/2020  Component Date Value Ref Range Status  . Blood Bank Specimen 07/17/2020 SAMPLE AVAILABLE FOR TESTING   Final  . Sample Expiration 07/17/2020    Final                   Value:07/20/2020,2359 Performed at [facility], [address] [place], [place] [address]   . WBC 07/17/2020 7.1  4.0 - 10.5 K/uL Final  . RBC 07/17/2020 2.86* 3.87 - 5.11 MIL/uL Final  . Hemoglobin 07/17/2020 8.4* 12.0 - 15.0 g/dL Final  . HCT 07/17/2020 27.7* 36 - 46 % Final  . MCV 07/17/2020 96.9  80.0 - 100.0 fL Final  . MCH 07/17/2020 29.4  26.0 - 34.0 pg Final  . MCHC 07/17/2020 30.3   30.0 - 36.0 g/dL Final  . RDW 07/17/2020 17.7* 11.5 - 15.5 % Final  . Platelets 07/17/2020 158  150 - 400 K/uL Final  . nRBC 07/17/2020 0.0  0.0 - 0.2 % Final  . Neutrophils Relative % 07/17/2020 51  % Final  . Neutro Abs 07/17/2020 3.6  1.7 - 7.7 K/uL Final  . Lymphocytes Relative 07/17/2020 22  % Final  . Lymphs Abs 07/17/2020 1.6  0.7 - 4.0 K/uL Final  . Monocytes Relative 07/17/2020 19  % Final  . Monocytes Absolute 07/17/2020 1.4* 0.1 - 1.0 K/uL Final  . Eosinophils Relative 07/17/2020 1  % Final  . Eosinophils Absolute 07/17/2020 0.0  0.0 - 0.5 K/uL Final  . Basophils Relative 07/17/2020 0  % Final  . Basophils Absolute 07/17/2020 0.0  0.0 - 0.1 K/uL Final  . WBC Morphology 07/17/2020 MORPHOLOGY UNREMARKABLE   Final  . RBC Morphology 07/17/2020 MORPHOLOGY UNREMARKABLE   Final  . Smear Review 07/17/2020 Normal platelet morphology   Final  . Immature Granulocytes 07/17/2020 7  % Final  . Abs Immature Granulocytes 07/17/2020 0.51* 0.00 - 0.07 K/uL Final   Performed at [facility], [address] [place], [place] [address]    Assessment:  Belkis Norbeck is a 86 y.o. female with anemia of chronic kidney disease. Hemoglobin has ranged between 8.2 - 10.4 over the past 4 years.  Work-up on 12/03/2020revealed a hematocrit of 27.4, hemoglobin 9.2, MCV 91.3, platelets 141,000, white count 7100 with an [place] of 3799. Monocyte countwas 1299. Iron saturation was 34% with a TIBC of 194. Creatinine was 2.15 (CrCl 23 ml/min). Phosphorus was 4.4 (elevated). Calcium was 8.5 with an albumen of 3.8. PTH was 102. ANA was + with a titer of 1:320 with cytoplasmic pattern and a dsDNA of 11 (+). SPEPrevealed a poorly defined band of restricted protein mobility in the gammaglobulin region.  Work-up on01/15/2021revealed a hematocrit26.7, hemoglobin8.9,  MCV91.8, platelets130,000, WBC800, [id number]. Normal studies included: B12 (700), folate(12.1),  SPEP, and immunoglobulins.Kappa free light chain ratiowas1.75(0.26-1.75).Peripheral smearrevealed a normocytic anemia with mild thrombocytopenia. The morphology of the RBCs, WBCs, and platelets were within normal limits. There was no evidence of circulating blasts.  She has stage IV chronic kidney disease.  She receives Retacrit every 2 weeks (09/30/2019 - 01/06/2020).  Retacrit was increased from 10,000 units to 20,000 units on 01/06/2020 (last 07/10/2020).  She is not interested in receiving the COVID-19 vaccine at this time.   Symptomatically, she has been very shortness of breath.  Lower extremity swelling has improved.  She has been urinating less than usual.  Appetite is poor.  Exam reveals JVD, decreased breath sounds at the bases, and an irregular rhythm.  Hemoglobin is 8.2.  Plan: 1.   Labs today: CBC with diff, hold tube. 2. Anemia of chronic renal disease Hematocrit 27.1. Hemoglobin 8.2.MCV 97.1. Hemoglobin level remains below baseline. Ferritin 420 today.    Patient received Retacrit 20,000 units on 07/10/2020.  No Retacrit today. 3. Monocytosis Monocyte count 1100 (17% of WBC) today.   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 Continue to monitor 4. Shortness of breath  Patient notes significant shortness of breath.  She describes decreased urine output.  Exm worrisome for CHF and atrial fibrillation.  Discuss referral to the ER for evaluation. 5.Ratient to ER. 6.   RTC as previously scheduled.  I discussed the assessment and treatment plan with the patient.  The patient was provided an opportunity to ask questions and all were answered.  The patient agreed with the plan and demonstrated an understanding of the instructions.  The patient was advised to call back if the symptoms worsen or if the condition fails to improve as  anticipated.  I provided 17 minutes of face-to-face time during this this encounter and > 50% was spent counseling as documented under my assessment and plan.  An additional 10 minutes were spent reviewing her chart (Epic and Care Everywhere) including notes, labs, and imaging studies and referral to the ER (triage contacted).   Lequita Asal, MD, PhD    07/18/2020, 10:51 AM  I, Mirian Mo Tufford, am acting as Education administrator for [employer]. Mike Gip, MD, PhD.  I, Ericia Moxley C. Mike Gip, MD, have reviewed the above documentation for accuracy and completeness, and I agree with the above.

## 2020-07-18 ENCOUNTER — Other Ambulatory Visit: Payer: Self-pay

## 2020-07-18 ENCOUNTER — Inpatient Hospital Stay: Payer: Medicare Other

## 2020-07-18 ENCOUNTER — Inpatient Hospital Stay
Admission: EM | Admit: 2020-07-18 | Discharge: 2020-07-24 | DRG: 291 | Disposition: A | Discharge status: Home / Self Care | Payer: Medicare Other | Attending: Internal Medicine | Admitting: Internal Medicine

## 2020-07-18 ENCOUNTER — Emergency Department: Payer: Medicare Other

## 2020-07-18 ENCOUNTER — Inpatient Hospital Stay: Payer: Medicare Other | Attending: Hematology and Oncology | Admitting: Hematology and Oncology

## 2020-07-18 ENCOUNTER — Encounter: Payer: Self-pay | Admitting: Hematology and Oncology

## 2020-07-18 ENCOUNTER — Inpatient Hospital Stay
Admit: 2020-07-18 | Discharge: 2020-07-18 | Disposition: A | Discharge status: Home / Self Care | Payer: Medicare Other | Attending: Physician Assistant | Admitting: Physician Assistant

## 2020-07-18 VITALS — BP 115/73 | HR 109 | Temp 97.8°F | Resp 20 | Ht 69.0 in | Wt 202.4 lb

## 2020-07-18 DIAGNOSIS — J90 Pleural effusion, not elsewhere classified: Secondary | ICD-10-CM

## 2020-07-18 DIAGNOSIS — I130 Hypertensive heart and chronic kidney disease with heart failure and stage 1 through stage 4 chronic kidney disease, or unspecified chronic kidney disease: Principal | ICD-10-CM | POA: Diagnosis present

## 2020-07-18 DIAGNOSIS — N189 Chronic kidney disease, unspecified: Secondary | ICD-10-CM

## 2020-07-18 DIAGNOSIS — R609 Edema, unspecified: Secondary | ICD-10-CM

## 2020-07-18 DIAGNOSIS — I5033 Acute on chronic diastolic (congestive) heart failure: Secondary | ICD-10-CM | POA: Diagnosis present

## 2020-07-18 DIAGNOSIS — D6869 Other thrombophilia: Secondary | ICD-10-CM

## 2020-07-18 DIAGNOSIS — Z20822 Contact with and (suspected) exposure to covid-19: Secondary | ICD-10-CM | POA: Diagnosis present

## 2020-07-18 DIAGNOSIS — I272 Pulmonary hypertension, unspecified: Secondary | ICD-10-CM | POA: Diagnosis present

## 2020-07-18 DIAGNOSIS — Z8249 Family history of ischemic heart disease and other diseases of the circulatory system: Secondary | ICD-10-CM | POA: Diagnosis not present

## 2020-07-18 DIAGNOSIS — J9611 Chronic respiratory failure with hypoxia: Secondary | ICD-10-CM | POA: Diagnosis present

## 2020-07-18 DIAGNOSIS — R531 Weakness: Secondary | ICD-10-CM | POA: Diagnosis present

## 2020-07-18 DIAGNOSIS — N184 Chronic kidney disease, stage 4 (severe): Secondary | ICD-10-CM

## 2020-07-18 DIAGNOSIS — J9691 Respiratory failure, unspecified with hypoxia: Secondary | ICD-10-CM | POA: Diagnosis present

## 2020-07-18 DIAGNOSIS — I4891 Unspecified atrial fibrillation: Secondary | ICD-10-CM | POA: Diagnosis not present

## 2020-07-18 DIAGNOSIS — K219 Gastro-esophageal reflux disease without esophagitis: Secondary | ICD-10-CM | POA: Diagnosis present

## 2020-07-18 DIAGNOSIS — E785 Hyperlipidemia, unspecified: Secondary | ICD-10-CM | POA: Diagnosis present

## 2020-07-18 DIAGNOSIS — I499 Cardiac arrhythmia, unspecified: Secondary | ICD-10-CM

## 2020-07-18 DIAGNOSIS — I10 Essential (primary) hypertension: Secondary | ICD-10-CM | POA: Diagnosis not present

## 2020-07-18 DIAGNOSIS — J9621 Acute and chronic respiratory failure with hypoxia: Secondary | ICD-10-CM | POA: Diagnosis present

## 2020-07-18 DIAGNOSIS — J9601 Acute respiratory failure with hypoxia: Secondary | ICD-10-CM | POA: Diagnosis present

## 2020-07-18 DIAGNOSIS — I4821 Permanent atrial fibrillation: Secondary | ICD-10-CM

## 2020-07-18 DIAGNOSIS — H9190 Unspecified hearing loss, unspecified ear: Secondary | ICD-10-CM | POA: Diagnosis present

## 2020-07-18 DIAGNOSIS — D649 Anemia, unspecified: Secondary | ICD-10-CM

## 2020-07-18 DIAGNOSIS — D638 Anemia in other chronic diseases classified elsewhere: Secondary | ICD-10-CM

## 2020-07-18 DIAGNOSIS — D72821 Monocytosis (symptomatic): Secondary | ICD-10-CM | POA: Diagnosis not present

## 2020-07-18 DIAGNOSIS — R0602 Shortness of breath: Secondary | ICD-10-CM | POA: Insufficient documentation

## 2020-07-18 DIAGNOSIS — D6859 Other primary thrombophilia: Secondary | ICD-10-CM | POA: Diagnosis present

## 2020-07-18 DIAGNOSIS — I482 Chronic atrial fibrillation, unspecified: Secondary | ICD-10-CM

## 2020-07-18 DIAGNOSIS — D631 Anemia in chronic kidney disease: Secondary | ICD-10-CM | POA: Diagnosis present

## 2020-07-18 DIAGNOSIS — I509 Heart failure, unspecified: Secondary | ICD-10-CM

## 2020-07-18 DIAGNOSIS — Z79899 Other long term (current) drug therapy: Secondary | ICD-10-CM

## 2020-07-18 DIAGNOSIS — I739 Peripheral vascular disease, unspecified: Secondary | ICD-10-CM | POA: Diagnosis present

## 2020-07-18 LAB — RESPIRATORY PANEL BY RT PCR (FLU A&B, COVID)
Influenza A by PCR: NEGATIVE
Influenza B by PCR: NEGATIVE
SARS Coronavirus 2 by RT PCR: NEGATIVE

## 2020-07-18 LAB — CBC WITH DIFFERENTIAL/PLATELET
Abs Immature Granulocytes: 0.45 10*3/uL — ABNORMAL HIGH (ref 0.00–0.07)
Basophils Absolute: 0 10*3/uL (ref 0.0–0.1)
Basophils Relative: 0 %
Eosinophils Absolute: 0 10*3/uL (ref 0.0–0.5)
Eosinophils Relative: 0 %
HCT: 27.1 % — ABNORMAL LOW (ref 36.0–46.0)
Hemoglobin: 8.2 g/dL — ABNORMAL LOW (ref 12.0–15.0)
Immature Granulocytes: 7 %
Lymphocytes Relative: 20 %
Lymphs Abs: 1.3 10*3/uL (ref 0.7–4.0)
MCH: 29.4 pg (ref 26.0–34.0)
MCHC: 30.3 g/dL (ref 30.0–36.0)
MCV: 97.1 fL (ref 80.0–100.0)
Monocytes Absolute: 1.1 10*3/uL — ABNORMAL HIGH (ref 0.1–1.0)
Monocytes Relative: 17 %
Neutro Abs: 3.5 10*3/uL (ref 1.7–7.7)
Neutrophils Relative %: 56 %
Platelets: 142 10*3/uL — ABNORMAL LOW (ref 150–400)
RBC: 2.79 MIL/uL — ABNORMAL LOW (ref 3.87–5.11)
RDW: 17.2 % — ABNORMAL HIGH (ref 11.5–15.5)
Smear Review: NORMAL
WBC: 6.4 10*3/uL (ref 4.0–10.5)
nRBC: 0 % (ref 0.0–0.2)

## 2020-07-18 LAB — RETICULOCYTES
Immature Retic Fract: 32.4 % — ABNORMAL HIGH (ref 2.3–15.9)
RBC.: 2.67 MIL/uL — ABNORMAL LOW (ref 3.87–5.11)
Retic Count, Absolute: 150.9 10*3/uL (ref 19.0–186.0)
Retic Ct Pct: 5.7 % — ABNORMAL HIGH (ref 0.4–3.1)

## 2020-07-18 LAB — TYPE AND SCREEN
ABO/RH(D): O NEG
Antibody Screen: NEGATIVE

## 2020-07-18 LAB — COMPREHENSIVE METABOLIC PANEL
ALT: 7 U/L (ref 0–44)
AST: 13 U/L — ABNORMAL LOW (ref 15–41)
Albumin: 3.9 g/dL (ref 3.5–5.0)
Alkaline Phosphatase: 71 U/L (ref 38–126)
Anion gap: 9 (ref 5–15)
BUN: 44 mg/dL — ABNORMAL HIGH (ref 8–23)
CO2: 24 mmol/L (ref 22–32)
Calcium: 8.6 mg/dL — ABNORMAL LOW (ref 8.9–10.3)
Chloride: 103 mmol/L (ref 98–111)
Creatinine, Ser: 1.96 mg/dL — ABNORMAL HIGH (ref 0.44–1.00)
GFR, Estimated: 24 mL/min — ABNORMAL LOW (ref >60)
Glucose, Bld: 118 mg/dL — ABNORMAL HIGH (ref 70–99)
Potassium: 4.8 mmol/L (ref 3.5–5.1)
Sodium: 136 mmol/L (ref 135–145)
Total Bilirubin: 1.1 mg/dL (ref 0.3–1.2)
Total Protein: 7.3 g/dL (ref 6.5–8.1)

## 2020-07-18 LAB — BRAIN NATRIURETIC PEPTIDE: B Natriuretic Peptide: 651 pg/mL — ABNORMAL HIGH (ref 0.0–100.0)

## 2020-07-18 LAB — MAGNESIUM: Magnesium: 1.9 mg/dL (ref 1.7–2.4)

## 2020-07-18 LAB — TROPONIN I (HIGH SENSITIVITY)
Troponin I (High Sensitivity): 17 ng/L (ref <18)
Troponin I (High Sensitivity): 15 ng/L (ref <18)

## 2020-07-18 LAB — HEMOGLOBIN A1C
Hgb A1c MFr Bld: 4.5 % — ABNORMAL LOW (ref 4.8–5.6)
Mean Plasma Glucose: 82.45 mg/dL

## 2020-07-18 LAB — PROCALCITONIN: Procalcitonin: <0.1 ng/mL

## 2020-07-18 LAB — IRON AND TIBC
Iron: 36 ug/dL (ref 28–170)
Saturation Ratios: 15 % (ref 10.4–31.8)
TIBC: 246 ug/dL — ABNORMAL LOW (ref 250–450)
UIBC: 210 ug/dL

## 2020-07-18 LAB — FIBRIN DERIVATIVES D-DIMER (ARMC ONLY): Fibrin derivatives D-dimer (ARMC): 2029.51 ng/mL (FEU) — ABNORMAL HIGH (ref 0.00–499.00)

## 2020-07-18 LAB — FOLATE: Folate: 12.7 ng/mL (ref >5.9)

## 2020-07-18 LAB — FERRITIN: Ferritin: 420 ng/mL — ABNORMAL HIGH (ref 11–307)

## 2020-07-18 MED ORDER — IRBESARTAN 150 MG PO TABS
75.0000 mg | ORAL_TABLET | Freq: Every day | ORAL | Status: DC
  Administered 2020-07-19: 75 mg via ORAL
  Filled 2020-07-18: qty 1

## 2020-07-18 MED ORDER — PANTOPRAZOLE SODIUM 40 MG PO TBEC
40.0000 mg | DELAYED_RELEASE_TABLET | Freq: Every day | ORAL | Status: DC
  Administered 2020-07-18 – 2020-07-24 (×7): 40 mg via ORAL
  Filled 2020-07-18 (×7): qty 1

## 2020-07-18 MED ORDER — FUROSEMIDE 10 MG/ML IJ SOLN
80.0000 mg | Freq: Once | INTRAMUSCULAR | Status: AC
  Administered 2020-07-18: 80 mg via INTRAVENOUS
  Filled 2020-07-18: qty 8

## 2020-07-18 MED ORDER — ACETAMINOPHEN 325 MG PO TABS: 650.0000 mg | ORAL_TABLET | Freq: Four times a day (QID) | ORAL | Status: DC | PRN

## 2020-07-18 MED ORDER — CLONIDINE HCL 0.1 MG PO TABS
0.3000 mg | ORAL_TABLET | Freq: Every day | ORAL | Status: DC
  Filled 2020-07-18: qty 3

## 2020-07-18 MED ORDER — CARVEDILOL 6.25 MG PO TABS
6.2500 mg | ORAL_TABLET | Freq: Two times a day (BID) | ORAL | Status: DC
  Administered 2020-07-18 – 2020-07-24 (×12): 6.25 mg via ORAL
  Filled 2020-07-18 (×12): qty 1

## 2020-07-18 MED ORDER — SODIUM CHLORIDE 0.9% FLUSH
3.0000 mL | Freq: Two times a day (BID) | INTRAVENOUS | Status: DC
  Administered 2020-07-18 – 2020-07-24 (×12): 3 mL via INTRAVENOUS

## 2020-07-18 MED ORDER — SODIUM CHLORIDE 0.9 % IV SOLN: 250.0000 mL | INTRAVENOUS | Status: DC | PRN

## 2020-07-18 MED ORDER — CLONIDINE HCL 0.3 MG/24HR TD PTWK: 0.3000 mg | MEDICATED_PATCH | TRANSDERMAL | Status: DC

## 2020-07-18 MED ORDER — ACETAMINOPHEN 650 MG RE SUPP: 650.0000 mg | Freq: Four times a day (QID) | RECTAL | Status: DC | PRN

## 2020-07-18 MED ORDER — ORAL CARE MOUTH RINSE
15.0000 mL | Freq: Two times a day (BID) | OROMUCOSAL | Status: DC
  Administered 2020-07-19 – 2020-07-24 (×11): 15 mL via OROMUCOSAL

## 2020-07-18 MED ORDER — SODIUM CHLORIDE 0.9% FLUSH: 3.0000 mL | INTRAVENOUS | Status: DC | PRN

## 2020-07-18 MED ORDER — SODIUM CHLORIDE 0.9% FLUSH
3.0000 mL | Freq: Two times a day (BID) | INTRAVENOUS | Status: DC
  Administered 2020-07-19 – 2020-07-24 (×10): 3 mL via INTRAVENOUS

## 2020-07-18 MED ORDER — AMLODIPINE BESYLATE 10 MG PO TABS
10.0000 mg | ORAL_TABLET | Freq: Every day | ORAL | Status: DC
  Administered 2020-07-18: 10 mg via ORAL
  Filled 2020-07-18: qty 2

## 2020-07-18 MED ORDER — ALBUTEROL SULFATE HFA 108 (90 BASE) MCG/ACT IN AERS
2.0000 | INHALATION_SPRAY | Freq: Four times a day (QID) | RESPIRATORY_TRACT | Status: DC | PRN
  Filled 2020-07-18: qty 6.7

## 2020-07-18 MED ORDER — CARVEDILOL 6.25 MG PO TABS: 6.2500 mg | ORAL_TABLET | Freq: Two times a day (BID) | ORAL | Status: DC

## 2020-07-18 MED ORDER — HEPARIN SODIUM (PORCINE) 5000 UNIT/ML IJ SOLN
5000.0000 [IU] | Freq: Three times a day (TID) | INTRAMUSCULAR | Status: DC
  Administered 2020-07-18 – 2020-07-19 (×4): 5000 [IU] via SUBCUTANEOUS
  Filled 2020-07-18 (×4): qty 1

## 2020-07-18 NOTE — ED Notes (Signed)
Resumed care from dee rn pt alert.  A fib on monitor.  Family with pt.  Pt reports she is breathing better.  purewick in place.  approx 300cc urine in cannister.   Pt on 2 liters oxygen.  Pharmacy in with pt.

## 2020-07-18 NOTE — H&P (Addendum)
History and Physical    Felicia [name] TIW:[id number] DOB: 01-17-1929 DOA: 07/18/2020  PCP: Sofie Hartigan, MD    Patient coming from:  home   Chief Complaint:  SOB   HPI: Felicia [name] is a 86 y.o. female with medical history significant of HTN, seen in ed and found ot be in a.fib . Pt states she has been sob for about 3 weeks and reports that she is mostly complaint with her sodium restriction. Son at bedside. ROS is otherwise negative, she lives alone. Pt reports progressive sob and orthopnea. Pt reports that she has been to all her doctors and still is feeling bad.   ED Course:  Vitals:   07/18/20 1600 07/18/20 1615 07/18/20 1630 07/18/20 1645  BP: 123/75     Pulse: (!) 108 (!) 41 (!) 135 (!) 129  Resp: (!) 22 16 (!) 24 20  Temp:      TempSrc:      SpO2: 96% 96% 93% 92%  Weight:      Height:      IN ed pt received lasix 80 mg and cardiology is consulted. Pt found to have new atrial fibrillation and and was continued on coreg.    Review of Systems:  Review of Systems  Respiratory: Positive for shortness of breath.   Cardiovascular: Positive for orthopnea and leg swelling.  All other systems reviewed and are negative.    Past Medical History:  Diagnosis Date  . GERD (gastroesophageal reflux disease)   . HOH (hard of hearing)   . Hypertension   . Pneumonia   . Vertigo     Past Surgical History:  Procedure Laterality Date  . APPENDECTOMY  1939  . TONSILLECTOMY  1939     reports that she has never smoked. She has never used smokeless tobacco. She reports that she does not drink alcohol and does not use drugs.  No Known Allergies  Family History  Problem Relation Age of Onset  . Heart disease Mother   . Goiter Mother   . Bladder Cancer Father 36    Prior to Admission medications   Medication Sig Start Date End Date Taking? Authorizing Provider  albuterol (PROVENTIL HFA;VENTOLIN HFA) 108 (90 Base) MCG/ACT inhaler Inhale 2 puffs  into the lungs every 6 (six) hours as needed for wheezing or shortness of breath. 09/15/16   Gouru, Illene Silver, MD  amLODipine (NORVASC) 10 MG tablet Take 10 mg by mouth daily.    [provider]  carvedilol (COREG) 6.25 MG tablet Take 6.25 mg by mouth 2 (two) times daily with a meal.    [provider]  cloNIDine (CATAPRES - DOSED IN MG/24 HR) 0.3 mg/24hr patch Place 0.3 mg onto the skin once a week.    [provider]  cyanocobalamin 1000 MCG tablet Take 1,000 mcg by mouth daily.    [provider]  Ferrous Sulfate (IRON) 325 (65 Fe) MG TABS Take by mouth.    [provider]  omeprazole (PRILOSEC) 20 MG capsule Take 20 mg by mouth daily.    [provider]  telmisartan (MICARDIS) 80 MG tablet Take 80 mg by mouth daily.    [provider]    Physical Exam: Vitals:   07/18/20 1600 07/18/20 1615 07/18/20 1630 07/18/20 1645  BP: 123/75     Pulse: (!) 108 (!) 41 (!) 135 (!) 129  Resp: (!) 22 16 (!) 24 20  Temp:      TempSrc:  SpO2: 96% 96% 93% 92%  Weight:      Height:        Physical Exam Vitals and nursing note reviewed.  HENT:     Head: Normocephalic and atraumatic.     Nose: Nose normal.  Eyes:     Extraocular Movements: Extraocular movements intact.  Cardiovascular:     Rate and Rhythm: Tachycardia present. Rhythm irregular.     Pulses: Normal pulses.     Heart sounds: Murmur heard.  Systolic murmur is present.   Pulmonary:     Effort: Pulmonary effort is normal.     Breath sounds: No decreased breath sounds.  Abdominal:     Palpations: Abdomen is soft.  Musculoskeletal:     Right lower leg: 2+ Pitting Edema present.     Left lower leg: 2+ Pitting Edema present.  Skin:    General: Skin is warm.  Neurological:     General: No focal deficit present.     Mental Status: She is alert and oriented to person, place, and time.  Psychiatric:        Mood and Affect: Mood normal.        Behavior: Behavior normal.      Labs on Admission: I have personally reviewed following labs and imaging studies  CBC: Recent Labs  Lab 07/17/20 1408 07/18/20 1228  WBC 7.1 6.4  NEUTROABS 3.6 3.5  HGB 8.4* 8.2*  HCT 27.7* 27.1*  MCV 96.9 97.1  PLT 158 720*   Basic Metabolic Panel: Recent Labs  Lab 07/18/20 1228  NA 136  K 4.8  CL 103  CO2 24  GLUCOSE 118*  BUN 44*  CREATININE 1.96*  CALCIUM 8.6*  MG 1.9   GFR: Estimated Creatinine Clearance: 22.9 mL/min (A) (by C-G formula based on SCr of 1.96 mg/dL (H)). Liver Function Tests: Recent Labs  Lab 07/18/20 1228  AST 13*  ALT 7  ALKPHOS 71  BILITOT 1.1  PROT 7.3  ALBUMIN 3.9   No results for input(s): LIPASE, AMYLASE in the last 168 hours. No results for input(s): AMMONIA in the last 168 hours. Coagulation Profile: No results for input(s): INR, PROTIME in the last 168 hours. Cardiac Enzymes: No results for input(s): CKTOTAL, CKMB, CKMBINDEX, TROPONINI in the last 168 hours. BNP (last 3 results) No results for input(s): PROBNP in the last 8760 hours. HbA1C: No results for input(s): HGBA1C in the last 72 hours. CBG: No results for input(s): GLUCAP in the last 168 hours. Lipid Profile: No results for input(s): CHOL, HDL, LDLCALC, TRIG, CHOLHDL, LDLDIRECT in the last 72 hours. Thyroid Function Tests: No results for input(s): TSH, T4TOTAL, FREET4, T3FREE, THYROIDAB in the last 72 hours. Anemia Panel: No results for input(s): VITAMINB12, FOLATE, FERRITIN, TIBC, IRON, RETICCTPCT in the last 72 hours. Urine analysis:    Component Value Date/Time   COLORURINE YELLOW (A) 09/24/2016 0857   APPEARANCEUR CLEAR (A) 09/24/2016 0857   LABSPEC 1.010 09/24/2016 0857   PHURINE 5.0 09/24/2016 0857   GLUCOSEU NEGATIVE 09/24/2016 0857   HGBUR SMALL (A) 09/24/2016 0857   BILIRUBINUR NEGATIVE 09/24/2016 0857   KETONESUR NEGATIVE 09/24/2016 0857   PROTEINUR NEGATIVE 09/24/2016 0857   NITRITE NEGATIVE 09/24/2016 0857   LEUKOCYTESUR NEGATIVE 09/24/2016  0857   No intake or output data in the 24 hours ending 07/18/20 1734 Lab Results  Component Value Date   CREATININE 1.96 (H) 07/18/2020   CREATININE 2.23 (H) 07/10/2020   CREATININE 1.84 (H) 04/13/2020    COVID-19 Labs  No  results for input(s): DDIMER, FERRITIN, LDH, CRP in the last 72 hours.  Lab Results  Component Value Date   [place] NEGATIVE 07/18/2020    Radiological Exams on Admission: DG Chest 1 View  Result Date: 07/18/2020 CLINICAL DATA:  86 year old female with shortness of breath EXAM: CHEST  1 VIEW COMPARISON:  09/24/2016 FINDINGS: Cardiomediastinal silhouette unchanged. Low lung volumes persist. Interlobular septal thickening. New blunting of the bilateral costophrenic angles with partial obscuration of the hemidiaphragms. No pneumothorax. Persisting coarsened interstitial opacities. IMPRESSION: Acute CHF with small pleural effusions, with background of chronic lung changes Electronically Signed   By: Corrie Mckusick D.O.   On: 07/18/2020 13:06    EKG: Independently reviewed.  A.fib rvr 115.    Assessment/Plan Respiratory failure with hypoxia (HCC) Assessment & Plan     D/D include chf/ ischemia / pe / pna. Pt seen in  ed on oxygen by [place] and sats in high 90's. We will start albuterol and nebs and monitor oxygen and heart rate and rhythm. Pt given lasix 80 mg iv x 1.  CT chest without contrast and le venous dopplers.  Intake/Output Summary (Last 24 hours) at 07/18/2020 2317 Last data filed at 07/18/2020 2130 Gross per 24 hour  Intake 123 ml  Output 500 ml  Net -377 ml    Atrial fibrillation with RVR ([place]) Assessment & Plan Pt on coreg and will given prn diltiazem and no NOAC per cards due to anemia. 2 D Echo.  Benign essential hypertension Assessment & Plan Blood pressure 123/75, pulse (!) 129, temperature 98.1 F (36.7 C), temperature source Oral, resp. rate 20, height 5\' 8"  (1.727 m), weight 90.7 kg, SpO2 92 %. BP is well controlled.  Continue  coreg and amlodipine and Avapro and clonidine.  Cardiology has been consulted and pt seen immediately by them.   Kidney disease, chronic, stage IV (GFR 15-29 ml/min) (HCC) Assessment & Plan Lab Results  Component Value Date   CREATININE 1.96 (H) 07/18/2020   CREATININE 2.23 (H) 07/10/2020   CREATININE 1.84 (H) 04/13/2020   Pt's kidney function is stable, but abnormal and we will avoid any  Contrast study and renally dose all meds.    DVT prophylaxis:  SCD'd because of anemia.  Code Status:  Full code   Family Communication:  Son at bedside   Disposition Plan:  home   Consults called:  Cardiology.  Admission status: Inpatient.   Para Skeans MD [facility] [phone] How to contact the [facility] Attending or Consulting provider [place] or covering provider during after hours [place], for this patient?    1. Check the care team in [facility] and look for a) attending/consulting [facility] provider listed and b) the [facility] team listed 2. Log into [email or web address] and use [place]'s universal password to access. If you do not have the password, please contact the hospital operator. 3. Locate the [facility] provider you are looking for under [facility] and page to a number that you can be directly reached. 4. If you still have difficulty reaching the provider, please page the [employer] (Director on Call) for the Hospitalists listed on amion for assistance. [email or web address] Password [facility] 07/18/2020, 5:34 PM

## 2020-07-18 NOTE — Plan of Care (Signed)

## 2020-07-18 NOTE — Assessment & Plan Note (Signed)
Blood pressure 123/75, pulse (!) 129, temperature 98.1 F (36.7 C), temperature source Oral, resp. rate 20, height 5\' 8"  (1.727 m), weight 90.7 kg, SpO2 92 %. BP is well controlled.  Continue coreg and amlodipine and Avapro and clonidine.  Cardiology has been consulted and pt seen immediately by them.

## 2020-07-18 NOTE — ED Triage Notes (Signed)
Pt here from UC with SOB and edema in her leg. Pt does not have a hx of chf. Pt a&o. Pt states that she does not have any energy. Pt NAD on arrival.

## 2020-07-18 NOTE — Assessment & Plan Note (Signed)
Lab Results  Component Value Date   CREATININE 1.96 (H) 07/18/2020   CREATININE 2.23 (H) 07/10/2020   CREATININE 1.84 (H) 04/13/2020   Pt's kidney function is stable, but abnormal and we will avoid any  Contrast study and renally dose all meds.

## 2020-07-18 NOTE — Progress Notes (Signed)
Patient here today for follow up. Reports feeling very short of breath, weak, and tired. States she doesn't feel any better from last week. States ankle and leg swelling has went down some. Also reports she has been drinking more water. Denies any pain.

## 2020-07-18 NOTE — Assessment & Plan Note (Signed)
Pt seen in  ed on oxygen by [place] and sats in high 90's. We will start albuterol and nebs and monitor oxygen and heart rate and rhythm. Pt given lasix 80 mg iv x 1.  CT chest without contrast and le venous dopplers.

## 2020-07-18 NOTE — Assessment & Plan Note (Addendum)
Pt on coreg and will given prn diltiazem and no NOAC per cards due to anemia. 2 D Echo.

## 2020-07-18 NOTE — ED Provider Notes (Addendum)
[facility] Emergency Department Provider Note  ____________________________________________   First MD Initiated Contact with Patient 07/18/20 1220     (approximate)  I have reviewed the triage vital signs and the nursing notes.   HISTORY  Chief Complaint Weakness, Shortness of Breath, and Leg Swelling   HPI Felicia [name] is a 86 y.o. female with past medical history of HTN, GERD, anemia, HDL, and moderate mitral insufficiency presents via EMS from urgent care for assessment of hypoxia in the setting of some shortness of breath.  Patient states has been feeling more short of breath over the last 3 weeks.  Patient does not wear oxygen at home but was hypoxic in urgent care on arrival to emergency room has an SPO2 saturation of 88% on room air which improved to 94% on 2 L nasal cannula.  Patient denies any acute pain including headache, earache, sore throat, congestion, cough, chest pain, back pain, [place] pain, nausea, vomiting, diarrhea, dysuria, rash, or other recent acute sick symptoms.  She does state she has to sleep in a recliner otherwise her shortness of breath get worse.  She also think she may have gained several pounds in the last couple weeks.  Denies any history of heart failure.  She is not currently on a diuretic.         Past Medical History:  Diagnosis Date  . GERD (gastroesophageal reflux disease)   . HOH (hard of hearing)   . Hypertension   . Pneumonia   . Vertigo     Patient Active Problem List   Diagnosis Date Noted  . Monocytosis 10/03/2019  . Normocytic anemia 09/17/2019  . Hyperlipidemia, mixed 09/17/2019  . Peripheral vascular disease ([place]) 09/17/2019  . Anemia in stage 4 chronic kidney disease ([place]) 09/17/2019  . ANA positive 09/01/2019  . Flat feet, bilateral 09/01/2019  . Kidney disease, chronic, stage IV (GFR 15-29 ml/min) ([place]) 09/01/2019  . Primary osteoarthritis of hand 09/01/2019  . Dizziness 02/24/2019    . LVH (left ventricular hypertrophy) due to hypertensive disease, without heart failure 01/28/2018  . HCAP (healthcare-associated pneumonia) 09/24/2016  . Sepsis ([place]) 09/12/2016  . Community acquired pneumonia 09/12/2016  . Hypoxia 09/12/2016  . Pleuritic chest pain 09/12/2016  . Pneumonia 09/12/2016  . Benign essential hypertension 01/05/2015  . Mild aortic valve stenosis 10/20/2014  . Moderate mitral insufficiency 10/20/2014  . Moderate tricuspid insufficiency 10/20/2014  . Chronic pulmonary hypertension ([place]) 09/22/2014  . Venous insufficiency of both lower extremities 09/22/2014  . CTS (carpal tunnel syndrome) 03/21/2014    Past Surgical History:  Procedure Laterality Date  . APPENDECTOMY  1939  . TONSILLECTOMY  1939    Prior to Admission medications   Medication Sig Start Date End Date Taking? Authorizing Provider  albuterol (PROVENTIL HFA;VENTOLIN HFA) 108 (90 Base) MCG/ACT inhaler Inhale 2 puffs into the lungs every 6 (six) hours as needed for wheezing or shortness of breath. 09/15/16   Gouru, Illene Silver, MD  amLODipine (NORVASC) 10 MG tablet Take 10 mg by mouth daily.    [provider]  carvedilol (COREG) 6.25 MG tablet Take 6.25 mg by mouth 2 (two) times daily with a meal.    [provider]  cloNIDine (CATAPRES - DOSED IN MG/24 HR) 0.3 mg/24hr patch Place 0.3 mg onto the skin once a week.    [provider]  cyanocobalamin 1000 MCG tablet Take 1,000 mcg by mouth daily.    [provider]  Ferrous Sulfate (IRON) 325 (65 Fe)  MG TABS Take by mouth.    [provider]  omeprazole (PRILOSEC) 20 MG capsule Take 20 mg by mouth daily.    [provider]  telmisartan (MICARDIS) 80 MG tablet Take 80 mg by mouth daily.    [provider]    Allergies Patient has no known allergies.  Family History  Problem Relation Age of Onset  . Heart disease Mother   . Goiter Mother   . Bladder Cancer Father 9    Social  History Social History   Tobacco Use  . Smoking status: Never Smoker  . Smokeless tobacco: Never Used  Vaping Use  . Vaping Use: Never used  Substance Use Topics  . Alcohol use: No  . Drug use: No    Review of Systems  Review of Systems  Constitutional: Positive for malaise/fatigue. Negative for chills and fever.  HENT: Negative for sore throat.   Eyes: Negative for pain.  Respiratory: Positive for shortness of breath. Negative for cough and stridor.   Cardiovascular: Negative for chest pain.  Gastrointestinal: Negative for vomiting.  Genitourinary: Negative for dysuria.  Musculoskeletal: Negative for myalgias.  Skin: Negative for rash.  Neurological: Negative for seizures, loss of consciousness and headaches.  Psychiatric/Behavioral: Negative for suicidal ideas.  All other systems reviewed and are negative.     ____________________________________________   PHYSICAL EXAM:  VITAL SIGNS: ED Triage Vitals [07/18/20 1224]  Enc Vitals Group     BP      Pulse      Resp      Temp      Temp src      SpO2      Weight      Height      Head Circumference      Peak Flow      Pain Score 0     Pain Loc      Pain Edu?      Excl. in [place]?    Vitals:   07/18/20 1229  BP: (!) 121/92  Pulse: (!) 115  Resp: 20  Temp: 98.1 F (36.7 C)  SpO2: 93%   Physical Exam Vitals and nursing note reviewed.  Constitutional:      General: She is not in acute distress.    Appearance: She is well-developed.  HENT:     Head: Normocephalic and atraumatic.     Right Ear: External ear normal.     Left Ear: External ear normal.     Nose: Nose normal.  Eyes:     Conjunctiva/sclera: Conjunctivae normal.  Neck:     Vascular: JVD present.  Cardiovascular:     Rate and Rhythm: Tachycardia present. Rhythm irregular.     Heart sounds: No murmur heard.   Pulmonary:     Effort: Pulmonary effort is normal. No respiratory distress.     Breath sounds: Examination of the right-lower field  reveals rales. Examination of the left-lower field reveals rales. Rales present.  Abdominal:     Palpations: Abdomen is soft.     Tenderness: There is no abdominal tenderness.  Musculoskeletal:     Cervical back: Neck supple.     Right lower leg: Edema present.     Left lower leg: Edema present.  Skin:    General: Skin is warm and dry.  Neurological:     Mental Status: She is alert and oriented to person, place, and time.  Psychiatric:        Mood and Affect: Mood normal.  ____________________________________________   LABS (all labs ordered are listed, but only abnormal results are displayed)  Labs Reviewed  CBC WITH DIFFERENTIAL/PLATELET - Abnormal; Notable for the following components:      Result Value   RBC 2.79 (*)    Hemoglobin 8.2 (*)    HCT 27.1 (*)    RDW 17.2 (*)    Platelets 142 (*)    Monocytes Absolute 1.1 (*)    Abs Immature Granulocytes 0.45 (*)    All other components within normal limits  COMPREHENSIVE METABOLIC PANEL - Abnormal; Notable for the following components:   Glucose, Bld 118 (*)    BUN 44 (*)    Creatinine, Ser 1.96 (*)    Calcium 8.6 (*)    AST 13 (*)    GFR, Estimated 24 (*)    All other components within normal limits  BRAIN NATRIURETIC PEPTIDE - Abnormal; Notable for the following components:   B Natriuretic Peptide 651.0 (*)    All other components within normal limits  FIBRIN DERIVATIVES D-DIMER ([facility] ONLY) - Abnormal; Notable for the following components:   Fibrin derivatives D-dimer ([facility]) 2,029.51 (*)    All other components within normal limits  RESPIRATORY PANEL BY RT PCR (FLU A&B, COVID)  MAGNESIUM  PROCALCITONIN  TROPONIN I (HIGH SENSITIVITY)   ____________________________________________  EKG  A. fib with a ventricular rate of 114, normal axis, prolonged QTc interval at 502 and no other clear evidence of acute ischemia or other significant arrhythmia. ____________________________________________  RADIOLOGY  ED  MD interpretation: Bilateral pulmonary vascular congestion without focal consolidation, pneumothorax, or other clear acute thoracic process.  Official radiology report(s): DG Chest 1 View  Result Date: 07/18/2020 CLINICAL DATA:  86 year old female with shortness of breath EXAM: CHEST  1 VIEW COMPARISON:  09/24/2016 FINDINGS: Cardiomediastinal silhouette unchanged. Low lung volumes persist. Interlobular septal thickening. New blunting of the bilateral costophrenic angles with partial obscuration of the hemidiaphragms. No pneumothorax. Persisting coarsened interstitial opacities. IMPRESSION: Acute CHF with small pleural effusions, with background of chronic lung changes Electronically Signed   By: Corrie Mckusick D.O.   On: 07/18/2020 13:06    ____________________________________________   PROCEDURES  Procedure(s) performed (including Critical Care):  .Critical Care Performed by: Lucrezia Starch, MD Authorized by: Lucrezia Starch, MD   Critical care provider statement:    Critical care time (minutes):  45   Critical care time was exclusive of:  Separately billable procedures and treating other patients   Critical care was necessary to treat or prevent imminent or life-threatening deterioration of the following conditions:  Respiratory failure   Critical care was time spent personally by me on the following activities:  Discussions with consultants, evaluation of patient's response to treatment, examination of patient, ordering and performing treatments and interventions, ordering and review of laboratory studies, ordering and review of radiographic studies, pulse oximetry, re-evaluation of patient's condition, obtaining history from patient or surrogate and review of old charts     ____________________________________________   INITIAL IMPRESSION / [place] / ED COURSE        Patient presents with [place] to history exam for assessment of shortness of breath as well as  hypoxia requiring 2 L nasal cannula.  On arrival patient is tachycardic with heart rate of 115 with otherwise stable vital signs on 2 L nasal cannula.  Differential includes but is not limited to acute heart failure, ACS, arrhythmia, anemia, metabolic derangements, PE, pneumonia, pneumothorax, and symptomatic effusion.  ECG it does show evidence  of A. fib which appears to be a new diagnosis for this patient.  The rate is recorded at 114 and her QTC is mildly prolonged at 502 there are no   While patient does have new onset A. Fib,  patient denies any chest pain, has no clear ischemic changes on ECG, and given trop 17 I have a low suspicion for ACS at this time. Magnesium is unremarkable. CBC shows evidence of CKD with a creatinine of 1.96 which is compared to 2.23 days ago and no significant Metabolic derangements. CBC shows evidence of chronic anemia with a hemoglobin of 8.2 compared to 8 8 days ago with normal white blood cell count and platelets. This is compared to 9.34-month ago I have a lower suspicion for acute symptomatic anemia versus heart failure given patient appears grossly volume overloaded on exam and chest x-ray.  Will patient's D-dimer is elevated and I believe heart failure is more likely at this time and will defer VQ scan which would be indicated given patient's decreased GFR I last patient's hypoxia fails to improve with diuresis.  I did discuss patient's presentation work-up in new A. fib with on-call cardiologist Dr. Saralyn Pilar who recommended not initiating anticoagulation or rate control at this time but did agree with diuresis.  Lasix ordered as noted below. I will plan to admit to hospitalist for further evaluation management.      ____________________________________________   FINAL CLINICAL IMPRESSION(S) / ED DIAGNOSES  Final diagnoses:  Acute respiratory failure with hypoxia (HCC)  Acute heart failure, unspecified heart failure type (HCC)  Atrial fibrillation,  unspecified type (HCC)  Chronic anemia  Chronic kidney disease, unspecified CKD stage    Medications  furosemide (LASIX) injection 80 mg (has no administration in time range)  albuterol (VENTOLIN HFA) 108 (90 Base) MCG/ACT inhaler 2 puff (has no administration in time range)  amLODipine (NORVASC) tablet 10 mg (has no administration in time range)  carvedilol (COREG) tablet 6.25 mg (has no administration in time range)  cloNIDine (CATAPRES - Dosed in mg/24 hr) patch 0.3 mg (has no administration in time range)  pantoprazole (PROTONIX) EC tablet 40 mg (has no administration in time range)  irbesartan (AVAPRO) tablet 75 mg (has no administration in time range)     ED Discharge Orders    None       Note:  This document was prepared using Dragon voice recognition software and may include unintentional dictation errors.   Lucrezia Starch, MD 07/18/20 1354    Lucrezia Starch, MD 07/18/20 1355

## 2020-07-18 NOTE — Consult Note (Signed)
CARDIOLOGY CONSULT NOTE               Patient ID: Felicia [name] MRN: [id number] DOB/AGE: [birth year] 86 y.o.  Admit date: 07/18/2020 Referring Physician, Florina Ou Primary Physician [place] Primary Cardiologist Nehemiah Massed Reason for Consultation CHF, atrial fibrillation  HPI: 86 year old female referred for evaluation of acute CHF and new onset atrial fibrillation. The patient has a history of chronic anemia of chronic renal disease stage IV on Retacrit every 2 weeks, followed by hematology/oncology, and nephrology, hypertension, GERD, hyperlipidemia, peripheral vascular disease, chronic venous insufficiency, and moderate valvular insufficiencies, moderate aortic stenosis, LVH, and pulmonary hypertension. The patient presented to [facility] ER today for evaluation of a 1.5+ month history of progressive exertional shortness of breath, orthopnea, and weight gain. She has intermittent lower extremity edema, but reports a 1 day history of increased peripheral edema. The patient denies palpitations or chest pain. She had an especially salty meal 2 days ago, but otherwise has had no diet changes. Upon arrival to the ER, SpO2 was 88% on room air, with improvement to 94% on 2L [place]. ECG showed atrial fibrilation at a rate of 114 bpm. Chest xray shows acute CHF with small pleural effusions with background of chronic lung changes. Admission labs notable for high sensitivity troponin 17, hemoglobin 8.2 (9.4 in 04/2020), hematocrit 27.1, platelets 142, normal WBC, creatinine 1.96, BUN 44, GFR 24. Most recent 2D echocardiogram 10/12/2019 showed normal left ventricular function with LVEF greater than 55%, moderate AS, moderate MR, mild TR and PR. Per PCP note on 06/28/2020, patient had recent bright red blood per rectum and hemoccult cards were ordered.  She denies a history of MI, CHF, or known CAD. Currently, the patient reports feeling tired and short of breath.   Review of systems complete and found to  be negative unless listed above     Past Medical History:  Diagnosis Date  . GERD (gastroesophageal reflux disease)   . HOH (hard of hearing)   . Hypertension   . Pneumonia   . Vertigo     Past Surgical History:  Procedure Laterality Date  . APPENDECTOMY  1939  . TONSILLECTOMY  1939    (Not in a hospital admission)  Social History   Socioeconomic History  . Marital status: Widowed    Spouse name: Not on file  . Number of children: Not on file  . Years of education: Not on file  . Highest education level: Not on file  Occupational History  . Not on file  Tobacco Use  . Smoking status: Never Smoker  . Smokeless tobacco: Never Used  Vaping Use  . Vaping Use: Never used  Substance and Sexual Activity  . Alcohol use: No  . Drug use: No  . Sexual activity: Not on file  Other Topics Concern  . Not on file  Social History Narrative  . Not on file   Social Determinants of Health   Financial Resource Strain:   . Difficulty of Paying Living Expenses: Not on file  Food Insecurity:   . Worried About Charity fundraiser in the Last Year: Not on file  . Ran Out of Food in the Last Year: Not on file  Transportation Needs:   . Lack of Transportation (Medical): Not on file  . Lack of Transportation (Non-Medical): Not on file  Physical Activity:   . Days of Exercise per Week: Not on file  . Minutes of Exercise per Session: Not on file  Stress:   .  Feeling of Stress : Not on file  Social Connections:   . Frequency of Communication with Friends and Family: Not on file  . Frequency of Social Gatherings with Friends and Family: Not on file  . Attends Religious Services: Not on file  . Active Member of Clubs or Organizations: Not on file  . Attends Archivist Meetings: Not on file  . Marital Status: Not on file  Intimate Partner Violence:   . Fear of Current or Ex-Partner: Not on file  . Emotionally Abused: Not on file  . Physically Abused: Not on file  .  Sexually Abused: Not on file    Family History  Problem Relation Age of Onset  . Heart disease Mother   . Goiter Mother   . Bladder Cancer Father 70      Review of systems complete and found to be negative unless listed above      PHYSICAL EXAM  General: Well developed, well nourished, elderly female, sitting up in bed, appears comfortable, pleasant, smiling at times, in no acute distress HEENT:  Normocephalic and atramatic Neck:  No JVD.  Lungs: Clear bilaterally to auscultation, no wheezing, slight increased effort of breathing with prolonged talking on supplemental oxygen via [place] Heart: irregularly irregular. No murmurs, rubs, or gallops Abdomen: no obvious distention Msk:  Back normal, gait not assessed. Normal strength and tone for age. Extremities: chronic venous stasis skin changes, 1-2+ bilateral pedal edema.   Neuro: Alert and oriented X 3. Psych:  Good affect, responds appropriately  Labs:   Lab Results  Component Value Date   WBC 6.4 07/18/2020   HGB 8.2 (L) 07/18/2020   HCT 27.1 (L) 07/18/2020   MCV 97.1 07/18/2020   PLT 142 (L) 07/18/2020    Recent Labs  Lab 07/18/20 1228  NA 136  K 4.8  CL 103  CO2 24  BUN 44*  CREATININE 1.96*  CALCIUM 8.6*  PROT 7.3  BILITOT 1.1  ALKPHOS 71  ALT 7  AST 13*  GLUCOSE 118*   Lab Results  Component Value Date   TROPONINI 0.04 ([place]) 09/24/2016   No results found for: CHOL No results found for: HDL No results found for: LDLCALC No results found for: TRIG No results found for: CHOLHDL No results found for: LDLDIRECT    Radiology: DG Chest 1 View  Result Date: 07/18/2020 CLINICAL DATA:  86 year old female with shortness of breath EXAM: CHEST  1 VIEW COMPARISON:  09/24/2016 FINDINGS: Cardiomediastinal silhouette unchanged. Low lung volumes persist. Interlobular septal thickening. New blunting of the bilateral costophrenic angles with partial obscuration of the hemidiaphragms. No pneumothorax. Persisting  coarsened interstitial opacities. IMPRESSION: Acute CHF with small pleural effusions, with background of chronic lung changes Electronically Signed   By: Corrie Mckusick D.O.   On: 07/18/2020 13:06    telemetry: atrial fibrillation, rate 109-120 bpm  ASSESSMENT AND PLAN:  1. New onset atrial fibrillation, with a chads vasc score of 5, with RVR (109-120s bpm). Patient denies palpitations. 2. Acute CHF, presenting with a 1.5+ month history of progressive exertional shortness of breath, orthopnea, and weight gain. She has intermittent lower extremity edema, but reports a 1 day history of increased peripheral edema. Chest xray with acute CHF with small pleural effusions. Most recent 2D echocardiogram 10/12/2019 showed normal left ventricular function with LVEF greater than 55%, moderate AS, moderate MR, mild TR and PR. High sensitivity troponin normal ( 17 and 15). BNP elevated to 651. 3. Chronic anemia of chronic kidney disease  stage IV; on Retacrit, followed by hematology/oncology, recent history of BRBPR, hemoglobin currently 8.2 (9.4 in 04/2020), hematocrit 27.1, platelets 142.   Recommendations: 1. Start home medications, including carvedilol 6.25 mg BID for rate control. Will uptitrate if needed for rate control  2. 2D echocardiogram to assess LV function 3. Defer chronic anticoagulation at this time in light of worsening anemia 4. Recommend hematology involvement prior to initiating anticoagulation  5. Continue IV Lasix with careful monitoring of renal status, daily weights, and I&Os; consider nephrology involvement with history of CKD stage IV 6. Further recommendations pending patient's initial course   Signed: Sharolyn Douglas 07/18/2020, 1:27 PM  Discussed with Dr. Saralyn Pilar who agrees with the above plan.

## 2020-07-18 NOTE — ED Notes (Signed)
Report called to esther rn floor nurse

## 2020-07-19 ENCOUNTER — Inpatient Hospital Stay: Payer: Medicare Other

## 2020-07-19 DIAGNOSIS — J9601 Acute respiratory failure with hypoxia: Secondary | ICD-10-CM | POA: Diagnosis not present

## 2020-07-19 LAB — ECHOCARDIOGRAM COMPLETE
AR max vel: 0.74 cm2
AV Area VTI: 0.69 cm2
AV Area mean vel: 0.77 cm2
AV Mean grad: 17.7 mmHg
AV Peak grad: 28.4 mmHg
Ao pk vel: 2.67 m/s
Height: 68 in
S' Lateral: 3.07 cm
Weight: 3200 oz

## 2020-07-19 LAB — COMPREHENSIVE METABOLIC PANEL
ALT: 7 U/L (ref 0–44)
AST: 12 U/L — ABNORMAL LOW (ref 15–41)
Albumin: 3.8 g/dL (ref 3.5–5.0)
Alkaline Phosphatase: 63 U/L (ref 38–126)
Anion gap: 11 (ref 5–15)
BUN: 40 mg/dL — ABNORMAL HIGH (ref 8–23)
CO2: 26 mmol/L (ref 22–32)
Calcium: 9 mg/dL (ref 8.9–10.3)
Chloride: 100 mmol/L (ref 98–111)
Creatinine, Ser: 2.07 mg/dL — ABNORMAL HIGH (ref 0.44–1.00)
GFR, Estimated: 22 mL/min — ABNORMAL LOW (ref >60)
Glucose, Bld: 93 mg/dL (ref 70–99)
Potassium: 4.2 mmol/L (ref 3.5–5.1)
Sodium: 137 mmol/L (ref 135–145)
Total Bilirubin: 1 mg/dL (ref 0.3–1.2)
Total Protein: 6.8 g/dL (ref 6.5–8.1)

## 2020-07-19 LAB — CBC WITH DIFFERENTIAL/PLATELET
Abs Immature Granulocytes: 0.29 10*3/uL — ABNORMAL HIGH (ref 0.00–0.07)
Basophils Absolute: 0 10*3/uL (ref 0.0–0.1)
Basophils Relative: 0 %
Eosinophils Absolute: 0 10*3/uL (ref 0.0–0.5)
Eosinophils Relative: 1 %
HCT: 26.9 % — ABNORMAL LOW (ref 36.0–46.0)
Hemoglobin: 8.3 g/dL — ABNORMAL LOW (ref 12.0–15.0)
Immature Granulocytes: 5 %
Lymphocytes Relative: 23 %
Lymphs Abs: 1.3 10*3/uL (ref 0.7–4.0)
MCH: 29.6 pg (ref 26.0–34.0)
MCHC: 30.9 g/dL (ref 30.0–36.0)
MCV: 96.1 fL (ref 80.0–100.0)
Monocytes Absolute: 1.2 10*3/uL — ABNORMAL HIGH (ref 0.1–1.0)
Monocytes Relative: 21 %
Neutro Abs: 2.8 10*3/uL (ref 1.7–7.7)
Neutrophils Relative %: 50 %
Platelets: 139 10*3/uL — ABNORMAL LOW (ref 150–400)
RBC: 2.8 MIL/uL — ABNORMAL LOW (ref 3.87–5.11)
RDW: 17.2 % — ABNORMAL HIGH (ref 11.5–15.5)
WBC: 5.6 10*3/uL (ref 4.0–10.5)
nRBC: 0 % (ref 0.0–0.2)

## 2020-07-19 LAB — PROTIME-INR
INR: 1.2 (ref 0.8–1.2)
Prothrombin Time: 15.1 seconds (ref 11.4–15.2)

## 2020-07-19 LAB — APTT: aPTT: 34 seconds (ref 24–36)

## 2020-07-19 LAB — VITAMIN B12: Vitamin B-12: 957 pg/mL — ABNORMAL HIGH (ref 180–914)

## 2020-07-19 MED ORDER — HEPARIN (PORCINE) 25000 UT/250ML-% IV SOLN
1250.0000 [IU]/h | INTRAVENOUS | Status: DC
  Administered 2020-07-19: 1250 [IU]/h via INTRAVENOUS
  Filled 2020-07-19: qty 250

## 2020-07-19 MED ORDER — DILTIAZEM HCL ER COATED BEADS 120 MG PO CP24
120.0000 mg | ORAL_CAPSULE | Freq: Every day | ORAL | Status: DC
  Administered 2020-07-19 – 2020-07-20 (×2): 120 mg via ORAL
  Filled 2020-07-19 (×2): qty 1

## 2020-07-19 NOTE — TOC Progression Note (Signed)
Transition of Care [facility]) - Progression Note    Patient Details  Name: Felicia [name] MRN: [id number] Date of Birth: 04-14-1929  Transition of Care [facility] CM/SW Mona, RN Phone Number: 07/19/2020, 9:58 AM  Clinical Narrative:  Very pleasant 86yo, alert and oriented x4, lives alone with family support. Independent with ADL's, still drives and able to shop, pick up medication at the [facility] in [place]. PCP at the [facility], able to attend all appointments as scheduled. States 25yrs ago was hospitalized for the same problem and when discharged, had oxygen at home with nurse visits twice a week. Patient receptive to HH/Nurse visits if required when discharged.     Expected Discharge Plan: Home/Self Care Barriers to Discharge: Continued Medical Work up  Expected Discharge Plan and Services Expected Discharge Plan: Home/Self Care     Post Acute Care Choice: Home Health (Pending PT evaluation, however patient is agreeable.) Living arrangements for the past 2 months: Mobile Home                                       Social Determinants of Health (SDOH) Interventions    Readmission Risk Interventions No flowsheet data found.

## 2020-07-19 NOTE — Progress Notes (Signed)
PROGRESS NOTE   Felicia [name]  ONG:[id number] DOB: 1928/11/08 DOA: 07/18/2020 PCP: Sofie Hartigan, MD  Brief Narrative:  86 year old white female CKD 3, HTN Previous hospitalization 2018 for HCAP versus aspiration pneumonia Followed by oncologist Dr. Mike Gip for anemia of renal disease?  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 fib  Cardiology consulted  Assessment & Plan:   Principal Problem:   Respiratory failure with hypoxia ([place]) Active Problems:   Benign essential hypertension   Kidney disease, chronic, stage IV (GFR 15-29 ml/min) (HCC)   Atrial fibrillation with RVR ([place])   1. Acute hypoxic respiratory failure 2.  acute decompensated unknown type--?  Mitral valve aortic valve stenosis a. Echo = EF 55-60%-likely has tachycardia mediated heart failure b. Diuresis held[?] because of rising creatinine although her baseline GFR 21 in April of last 2021 c. Will give Lasix 20 IV daily d. Repeat CXR in a.m. 11/18 e. Daily weights, I/O -1.4 L at this time weight inaccurate at this time 3. EF 55-60% atrial fibrillation paroxysmal CHADS2 score >5 a. Has paroxysmal A. fib but may be labeled as permanent b. We will cautiously start heparin IV without bolus as I feel benefit with risk and we will need to challenge her lower GI tract anyway with the best place potentially being in the hospital in controlling c. She is never had colonoscopy previously and may need inpatient eval if she has bleeding with initiation of anticoagulation d. I will discuss with GI in the AM planning for the same e. She still remains in atrial fibrillation we will continue Coreg 6.25 twice daily, Cardizem 120 4. 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 Mike Gip a. She will follow up with them in the outpatient setting 5. History of endoscopy 05/2011 no prior colonoscopy in the system a. No current concerns 6. CKD stage IV a. Baseline GFR in the 20 range-cautious diuresis b. Will discontinue or cut back irbesartan and would definitely discontinue clonidine 7. HTN a. Amlodipine discontinued by cardiology-I am holding losartan at this time  DVT prophylaxis: IV heparin Code Status: Full Family Communication: Discussed with Mr. Katheran Awe son [phone] Disposition:  Status is: Inpatient  Remains inpatient appropriate because:Hemodynamically unstable, Persistent severe electrolyte disturbances and Ongoing diagnostic testing needed not appropriate for outpatient work up   Dispo: The patient is from: Home              Anticipated d/c is to: Home              Anticipated d/c date is: 2 days              Patient currently is not medically stable to d/c.       Consultants:   Cardiology  Procedures: Echo, CT, duplex  Antimicrobials: None   Subjective: Doing well Tells me last stool was 2 nights ago was nonbloody seen formed No chest pain no fever Feels a little less winded than previously but still wearing oxygen No nausea no vomiting ate lunch today  Objective: Vitals:   07/18/20 1813 07/18/20 2008 07/19/20 0421 07/19/20 0833  BP: 123/62 107/79 101/70 92/76  Pulse: 72 (!) 108 87 100  Resp:  18 18 16 18   Temp: 98.4 F (36.9 C) 98 F (36.7 C) 97.6 F (36.4 C) 98 F (36.7 C)  TempSrc: Oral Oral Oral Oral  SpO2: 98% 95% 94% 94%  Weight:   90.6 kg   Height:        Intake/Output Summary (Last 24 hours) at 07/19/2020 0839 Last data filed at 07/19/2020 0421 Gross per 24 hour  Intake 123 ml  Output 1200 ml  Net -1077 ml   Filed Weights   07/18/20 1225 07/19/20 0421  Weight: 90.7 kg 90.6 kg    Examination:  EOMI NCAT no focal deficit S1-S2 irregularly irregular A. fib on  monitors Chest clear no rales rhonchi are absent no added sounds Abdomen soft nontender no rebound no guarding Chronic venous stasis changes lower extremity no overt swelling Neurologically intact no focal deficit    Data Reviewed: I have personally reviewed following labs and imaging studies BUNs/creatinine 44/1.96-->40/2.07 Iron level 36 saturation ratio is 15 Hemoglobin 8.3 platelet 139 WBC 5.6 A1c 4.5  Radiology Studies: DG Chest 1 View  Result Date: 07/18/2020 CLINICAL DATA:  86 year old female with shortness of breath EXAM: CHEST  1 VIEW COMPARISON:  09/24/2016 FINDINGS: Cardiomediastinal silhouette unchanged. Low lung volumes persist. Interlobular septal thickening. New blunting of the bilateral costophrenic angles with partial obscuration of the hemidiaphragms. No pneumothorax. Persisting coarsened interstitial opacities. IMPRESSION: Acute CHF with small pleural effusions, with background of chronic lung changes Electronically Signed   By: Corrie Mckusick D.O.   On: 07/18/2020 13:06   CT CHEST WO CONTRAST  Result Date: 07/18/2020 CLINICAL DATA:  Respiratory failure, leg swelling EXAM: CT CHEST WITHOUT CONTRAST TECHNIQUE: Multidetector CT imaging of the chest was performed following the standard protocol without IV contrast. COMPARISON:  None. FINDINGS: Cardiovascular: There is extensive multi-vessel coronary artery calcification. Extensive calcification of the mitral valve annulus. Extensive senescent calcifications of the aortic valve leaflets. Mild global cardiomegaly. No pericardial effusion. The central pulmonary arteries are enlarged in keeping with changes of pulmonary arterial hypertension. Moderate atherosclerotic calcification within the thoracic aorta. No aortic aneurysm. Mediastinum/Nodes: 15 mm right thyroid nodule noted. There is shotty mediastinal and paraesophageal lymphadenopathy without frank pathologic enlargement. These are nonspecific and may be reactive in nature.  The esophagus is patulous, a finding that can be seen with gastroesophageal reflux or esophageal dysmotility. Lungs/Pleura: Moderate bilateral pleural effusions are present with compressive atelectasis of the lower lobes bilaterally. There is smooth interlobular septal thickening, best appreciated at the lung apices, compatible with trace pulmonary edema. No focal consolidation. No central obstructing lesion. No pneumothorax. Upper Abdomen: No acute abnormality. Musculoskeletal: No lytic or blastic bone lesions are seen. IMPRESSION: Trace pulmonary edema and moderate bilateral pleural effusions in keeping with mild cardiogenic failure. Extensive coronary artery calcification. Extensive calcification of the mitral valve annulus and aortic valve leaflets. This can be associated with valvular pathology and would be better assessed with echocardiography. Morphologic changes in keeping with pulmonary arterial hypertension. 15 mm right thyroid nodule. Recommend thyroid US (ref: J Am Coll Radiol. 2015 Feb;12(2): 143-50). Aortic Atherosclerosis (ICD10-I70.0). Electronically Signed   By: Fidela Salisbury MD   On: 07/18/2020 23:13     Scheduled Meds: . carvedilol  6.25 mg Oral BID WC  . cloNIDine  0.3 mg Oral Daily  . diltiazem  120 mg Oral Daily  . heparin  5,000 Units Subcutaneous Q8H  . irbesartan  75 mg Oral Daily  . mouth rinse  15 mL Mouth Rinse BID  . pantoprazole  40 mg Oral Daily  . sodium chloride flush  3 mL Intravenous Q12H  . sodium chloride flush  3 mL Intravenous Q12H   Continuous Infusions: . sodium chloride       LOS: 1 day    Time spent: [place], MD [facility] To contact the attending provider between 7A-7P or the covering provider during after hours 7P-7A, please log into the web site [email or web address] and access using universal [place] password for that web site. If you do not have the password, please call the hospital operator.  07/19/2020, 8:39 AM

## 2020-07-19 NOTE — Progress Notes (Signed)
ANTICOAGULATION CONSULT NOTE - Initial Consult  Pharmacy Consult for Heparin Infusion Indication: atrial fibrillation  No Known Allergies  Patient Measurements: Height: 5\' 8"  (172.7 cm) Weight: 90.6 kg (199 lb 11.8 oz) IBW/kg (Calculated) : 63.9 Heparin Dosing Weight: 83.1 kg  Vital Signs: Temp: 98.9 F (37.2 C) (11/17 1203) Temp Source: Oral (11/17 1203) BP: 116/66 (11/17 1203) Pulse Rate: 100 (11/17 1203)  Labs: Recent Labs    07/17/20 1408 07/17/20 1408 07/18/20 1228 07/18/20 1431 07/19/20 0854  HGB 8.4*   < > 8.2*  --  8.3*  HCT 27.7*  --  27.1*  --  26.9*  PLT 158  --  142*  --  139*  CREATININE  --   --  1.96*  --  2.07*  TROPONINIHS  --   --  17 15  --    < > = values in this interval not displayed.    Estimated Creatinine Clearance: 21.7 mL/min (A) (by C-G formula based on SCr of 2.07 mg/dL (H)).   Medical History: Past Medical History:  Diagnosis Date  . GERD (gastroesophageal reflux disease)   . HOH (hard of hearing)   . Hypertension   . Pneumonia   . Vertigo    Pt is 86 yo female ordered heparin drip for Afib.  Pt initially started on Heparin 5000 units q8hr on 11/16 with last dose given at 1611 today.  HDW: 83.1kg Hgb 8.3 Plt 139  Goal of Therapy:  Heparin level 0.3-0.7 units/ml Monitor platelets by anticoagulation protocol: Yes     Plan:  No bolusto be ordered prior to starting heparin drip.   Ordered stat INR/aPTT.  Start heparin infusion at 1250 units/hr Check anti-Xa level in 8 hours and daily while on heparin Continue to monitor H&H and platelets  Renda Rolls, PharmD, [facility] 07/19/2020 4:42 PM

## 2020-07-19 NOTE — TOC Initial Note (Signed)
Transition of Care [facility]) - Initial/Assessment Note    Patient Details  Name: Felicia [name] MRN: [id number] Date of Birth: [birth year]  Transition of [facility]) CM/SW Contact:    Kerin Salen, RN Phone Number: 07/19/2020, 10:03 AM  Clinical Narrative: Pleasant 86yo, lives alone, drives to shopping, PCP appointments and pick up medications from [facility] in [place]. Uses rolling walker and cane as needed when leaving the home, currently not receiving any health care services. Cane and walker were purchased by son four years ago. Family supportive and lives nearby.                  Expected Discharge Plan: Home/Self Care Barriers to Discharge: Continued Medical Work up   Patient Goals and CMS Choice Patient states their goals for this hospitalization and ongoing recovery are:: Return to [facility]      Expected Discharge Plan and Services Expected Discharge Plan: Home/Self Care     Post Acute Care Choice: Home Health (Pending PT evaluation, however patient is agreeable.) Living arrangements for the past 2 months: Mobile Home                                      Prior Living Arrangements/Services Living arrangements for the past 2 months: Mobile Home Lives with:: Self (Family and church members close by and available when needed.) Patient language and need for interpreter reviewed:: Yes Do you feel safe going back to the place where you live?: Yes      Need for Family Participation in Patient Care: Yes (Comment) Care giver support system in place?: Yes (comment)   Criminal Activity/Legal Involvement Pertinent to Current Situation/Hospitalization: No - Comment as needed  Activities of Daily Living Home Assistive Devices/Equipment: Cane (specify quad or straight) ADL Screening (condition at time of admission) Patient's cognitive ability adequate to safely complete daily activities?: Yes Is the patient deaf or have difficulty hearing?: No Does the  patient have difficulty seeing, even when wearing glasses/contacts?: No Does the patient have difficulty concentrating, remembering, or making decisions?: No Patient able to express need for assistance with ADLs?: Yes Does the patient have difficulty dressing or bathing?: No Independently performs ADLs?: Yes (appropriate for developmental age) Does the patient have difficulty walking or climbing stairs?: No Weakness of Legs: None Weakness of Arms/Hands: None  Permission Sought/Granted Permission sought to share information with : Case Manager                Emotional Assessment Appearance:: Appears stated age Attitude/Demeanor/Rapport: Engaged, Self-Confident Affect (typically observed): Accepting, Adaptable, Appropriate Orientation: : Oriented to Place, Oriented to Self, Oriented to  Time, Oriented to Situation Alcohol / Substance Use: Not Applicable Psych Involvement: No (comment)  Admission diagnosis:  Edema [R60.9] Chronic anemia [D64.9] Respiratory failure with hypoxia (HCC) [J96.91] Acute respiratory failure with hypoxia (HCC) [J96.01] Acute heart failure, unspecified heart failure type ([place]) [I50.9] Atrial fibrillation, unspecified type (HCC) [I48.91] Chronic kidney disease, unspecified CKD stage [N18.9] Patient Active Problem List   Diagnosis Date Noted  . Respiratory failure with hypoxia ([place]) 07/18/2020  . Atrial fibrillation with RVR ([place]) 07/18/2020  . Monocytosis 10/03/2019  . Normocytic anemia 09/17/2019  . Hyperlipidemia, mixed 09/17/2019  . Peripheral vascular disease ([place]) 09/17/2019  . Anemia in stage 4 chronic kidney disease ([place]) 09/17/2019  . ANA positive 09/01/2019  . Flat feet, bilateral 09/01/2019  . Kidney disease, chronic, stage IV (  GFR 15-29 ml/min) ([place]) 09/01/2019  . Primary osteoarthritis of hand 09/01/2019  . Dizziness 02/24/2019  . LVH (left ventricular hypertrophy) due to hypertensive disease, without heart failure 01/28/2018  . HCAP  (healthcare-associated pneumonia) 09/24/2016  . Sepsis ([place]) 09/12/2016  . Community acquired pneumonia 09/12/2016  . Hypoxia 09/12/2016  . Pleuritic chest pain 09/12/2016  . Pneumonia 09/12/2016  . Benign essential hypertension 01/05/2015  . Mild aortic valve stenosis 10/20/2014  . Moderate mitral insufficiency 10/20/2014  . Moderate tricuspid insufficiency 10/20/2014  . Chronic pulmonary hypertension ([place]) 09/22/2014  . Venous insufficiency of both lower extremities 09/22/2014  . CTS (carpal tunnel syndrome) 03/21/2014   PCP:  Sofie Hartigan, MD Pharmacy:   [facility] [address], [place] - [place] [address] Phone: [phone] Fax: [phone]     Social Determinants of Health (SDOH) Interventions    Readmission Risk Interventions No flowsheet data found.

## 2020-07-19 NOTE — Progress Notes (Addendum)
   Heart Failure Nurse Navigator Note  HF           She had previously reported EF of >55%.  Moderate AS.  Mild MR, TR and PI.   She presented to the ED with complaints of worsening SOB, lower extremity edema and orthopnea.  CXR revealed congestive heart failure, small bilateral pleural effusions.  Co morbidities:  CKD state 4 Chronic anemia Hypertension Hyperlipidemia  Labs:  Sodium 137, potassium 4.2 BUN 40, creatinine 2.07, troponin 17, BNP 651  Weight 90.6 kg BMI 30.37 Intake 123 ml Output 1200 ml  BP 92/76 pulse  100   Reds clip vest reading 35 this morning.  Assessment:  General- she is awake and alert, son is at bedside.  HEENT- pupils equal, no JVD  Cardiac- heart tones irregular, tachycardic with systolic murmur.  Chest- breath sounds diminished in the bases.  Abdomen- rounded, soft, non tender,  Musculoskeletal- trace lower extremity edema.  Psych- very pleasant and appropriate, makes eye contact.  Neuro- speech is clear.     Discussed with patient and son what heart failure is.  Do not have results of current Echo but explained role that atrial fibrillation played in the heart failure.  She denied having any palpitations.  Discussed daily weights, she was not doing this but has a scale,  Went over zone magnet and what to report to doctor.  Talked about the foods is fixes for herself, is aware of  Sodium and reads labels.  She does use salt at the table on some foods.  Discussed not doing that any longer and to try natural seasonings or Mrs. Dash.  Viewed heart failure teaching booklet and zone magnet.  They would like to look at videos later.  Will continue to follow.  Pricilla Riffle RN, CHFN

## 2020-07-19 NOTE — Progress Notes (Signed)
[facility] Cardiology    SUBJECTIVE: The patient states that she feels much better today. She reports improvement in exertional shortness of breath but is not back to her baseline. She is currently on 2L nasal cannula and does not use oxygen at home. She reports mild peripheral edema, with the left leg worse than the right, which is improved from yesterday. She denies chest pain or palpitations. She does report feeling "nervous and quivering inside." She denies presyncope or syncope.   Vitals:   07/18/20 1813 07/18/20 2008 07/19/20 0421 07/19/20 0833  BP: 123/62 107/79 101/70 92/76  Pulse: 72 (!) 108 87 100  Resp: 18 18 16 18   Temp: 98.4 F (36.9 C) 98 F (36.7 C) 97.6 F (36.4 C) 98 F (36.7 C)  TempSrc: Oral Oral Oral Oral  SpO2: 98% 95% 94% 94%  Weight:   90.6 kg   Height:         Intake/Output Summary (Last 24 hours) at 07/19/2020 0849 Last data filed at 07/19/2020 0421 Gross per 24 hour  Intake 123 ml  Output 1200 ml  Net -1077 ml      PHYSICAL EXAM  General: Well developed, well nourished, in no acute distress. She is sitting up comfortably in bed, watching TV. HEENT:  Normocephalic and atramatic Neck:  No JVD.  Lungs: Decreased breath sounds in right lower lobe. Slight increased effort of breathing with prolonged talking on 2L [place] Heart: Irregularly irregular. 2/6 systolic murmur, loudest at mid-left sternal border. Abdomen: No obvious distension Msk:  Back normal, gait not assessed. Normal strength and tone for age. Extremities: Chronic venous stasis skin changes, 1+ bilateral pedal edema Neuro: Alert and oriented X 3. Psych:  Good affect, responds appropriately   LABS: Basic Metabolic Panel: Recent Labs    07/18/20 1228  NA 136  K 4.8  CL 103  CO2 24  GLUCOSE 118*  BUN 44*  CREATININE 1.96*  CALCIUM 8.6*  MG 1.9   Liver Function Tests: Recent Labs    07/18/20 1228  AST 13*  ALT 7  ALKPHOS 71  BILITOT 1.1  PROT 7.3  ALBUMIN 3.9   No results for  input(s): LIPASE, AMYLASE in the last 72 hours. CBC: Recent Labs    07/17/20 1408 07/18/20 1228  WBC 7.1 6.4  NEUTROABS 3.6 3.5  HGB 8.4* 8.2*  HCT 27.7* 27.1*  MCV 96.9 97.1  PLT 158 142*   Cardiac Enzymes: No results for input(s): CKTOTAL, CKMB, CKMBINDEX, TROPONINI in the last 72 hours. BNP: Invalid input(s): POCBNP D-Dimer: No results for input(s): DDIMER in the last 72 hours. Hemoglobin A1C: Recent Labs    07/18/20 1816  HGBA1C 4.5*   Fasting Lipid Panel: No results for input(s): CHOL, HDL, LDLCALC, TRIG, CHOLHDL, LDLDIRECT in the last 72 hours. Thyroid Function Tests: No results for input(s): TSH, T4TOTAL, T3FREE, THYROIDAB in the last 72 hours.  Invalid input(s): FREET3 Anemia Panel: Recent Labs    07/18/20 1816  VITAMINB12 957*  FOLATE 12.7  FERRITIN 420*  TIBC 246*  IRON 36  RETICCTPCT 5.7*    DG Chest 1 View  Result Date: 07/18/2020 CLINICAL DATA:  86 year old female with shortness of breath EXAM: CHEST  1 VIEW COMPARISON:  09/24/2016 FINDINGS: Cardiomediastinal silhouette unchanged. Low lung volumes persist. Interlobular septal thickening. New blunting of the bilateral costophrenic angles with partial obscuration of the hemidiaphragms. No pneumothorax. Persisting coarsened interstitial opacities. IMPRESSION: Acute CHF with small pleural effusions, with background of chronic lung changes Electronically Signed   By:  Corrie Mckusick D.O.   On: 07/18/2020 13:06   CT CHEST WO CONTRAST  Result Date: 07/18/2020 CLINICAL DATA:  Respiratory failure, leg swelling EXAM: CT CHEST WITHOUT CONTRAST TECHNIQUE: Multidetector CT imaging of the chest was performed following the standard protocol without IV contrast. COMPARISON:  None. FINDINGS: Cardiovascular: There is extensive multi-vessel coronary artery calcification. Extensive calcification of the mitral valve annulus. Extensive senescent calcifications of the aortic valve leaflets. Mild global cardiomegaly. No  pericardial effusion. The central pulmonary arteries are enlarged in keeping with changes of pulmonary arterial hypertension. Moderate atherosclerotic calcification within the thoracic aorta. No aortic aneurysm. Mediastinum/Nodes: 15 mm right thyroid nodule noted. There is shotty mediastinal and paraesophageal lymphadenopathy without frank pathologic enlargement. These are nonspecific and may be reactive in nature. The esophagus is patulous, a finding that can be seen with gastroesophageal reflux or esophageal dysmotility. Lungs/Pleura: Moderate bilateral pleural effusions are present with compressive atelectasis of the lower lobes bilaterally. There is smooth interlobular septal thickening, best appreciated at the lung apices, compatible with trace pulmonary edema. No focal consolidation. No central obstructing lesion. No pneumothorax. Upper Abdomen: No acute abnormality. Musculoskeletal: No lytic or blastic bone lesions are seen. IMPRESSION: Trace pulmonary edema and moderate bilateral pleural effusions in keeping with mild cardiogenic failure. Extensive coronary artery calcification. Extensive calcification of the mitral valve annulus and aortic valve leaflets. This can be associated with valvular pathology and would be better assessed with echocardiography. Morphologic changes in keeping with pulmonary arterial hypertension. 15 mm right thyroid nodule. Recommend thyroid US (ref: J Am Coll Radiol. 2015 Feb;12(2): 143-50). Aortic Atherosclerosis (ICD10-I70.0). Electronically Signed   By: Fidela Salisbury MD   On: 07/18/2020 23:13     Echo: pending  TELEMETRY: atrial fibrillation, rate 91-133 bpm  ASSESSMENT AND PLAN:  Principal Problem:   Respiratory failure with hypoxia (HCC) Active Problems:   Benign essential hypertension   Kidney disease, chronic, stage IV (GFR 15-29 ml/min) (HCC)   Atrial fibrillation with RVR ([place])    1. New onset atrial fibrillation, with chads vasc score of 5, with RVR  (91-133 bpm). Patient denies palpitations but does report feeling "nervous and quivering inside." 2. Acute CHF, presenting with 1.5+ month history of progressive exertional shortness of breath, orthopnea, and weight gain. She has intermittent lower extremity edema with a 1 day history of increased peripheral edema. Peripheral edema has improved with IV Lasix 80 mg. Chest CT with acute CHF trace pulmonary edema and moderate bilateral pleural effusions. 2D echocardiogram pending. Previous echocardiogram 10/12/2019 showed normal left ventricular function with LVEF greater than 55%, moderate AS, moderate MR, mild TR and PR. High sensitivity troponin normal (17 -> 15), BNP elevated to 651. 3. Chronic anemia of chronic kidney disease stage IV on Retacrit, followed by hematology/oncology, recent history of BRBPR pending occult stool. Hemoglobin currently 8.2 (9.4 in 04/2020), hematocrit 27.1, platelets 142.  Recommendations: 1. Continue carvedilol 6.25 mg BID for rate control. 2. Discontinue amlodipine 10 mg. 3. Start Cardizem 120 mg daily for better rate control. 4. Defer chronic anticoagulation at this time in light of worsening anemia. Recommend hematology involvement prior to initiating anticoagulation. 5. Start IV Lasix 20 mg once daily or BID with careful monitoring of renal status, daily weights, and I&Os; consider nephrology involvement with history of CKD stage IV. 6. Review 2D echocardiogram.  Clabe Seal, PA-C 07/19/2020 8:49 AM

## 2020-07-20 ENCOUNTER — Inpatient Hospital Stay: Payer: Medicare Other

## 2020-07-20 DIAGNOSIS — J9601 Acute respiratory failure with hypoxia: Secondary | ICD-10-CM | POA: Diagnosis not present

## 2020-07-20 LAB — COMPREHENSIVE METABOLIC PANEL
ALT: 6 U/L (ref 0–44)
AST: 15 U/L (ref 15–41)
Albumin: 3.4 g/dL — ABNORMAL LOW (ref 3.5–5.0)
Alkaline Phosphatase: 62 U/L (ref 38–126)
Anion gap: 11 (ref 5–15)
BUN: 40 mg/dL — ABNORMAL HIGH (ref 8–23)
CO2: 25 mmol/L (ref 22–32)
Calcium: 8.7 mg/dL — ABNORMAL LOW (ref 8.9–10.3)
Chloride: 101 mmol/L (ref 98–111)
Creatinine, Ser: 2.18 mg/dL — ABNORMAL HIGH (ref 0.44–1.00)
GFR, Estimated: 21 mL/min — ABNORMAL LOW (ref >60)
Glucose, Bld: 94 mg/dL (ref 70–99)
Potassium: 4.1 mmol/L (ref 3.5–5.1)
Sodium: 137 mmol/L (ref 135–145)
Total Bilirubin: 1.1 mg/dL (ref 0.3–1.2)
Total Protein: 6.4 g/dL — ABNORMAL LOW (ref 6.5–8.1)

## 2020-07-20 LAB — CBC WITH DIFFERENTIAL/PLATELET
Abs Immature Granulocytes: 0.19 10*3/uL — ABNORMAL HIGH (ref 0.00–0.07)
Basophils Absolute: 0 10*3/uL (ref 0.0–0.1)
Basophils Relative: 0 %
Eosinophils Absolute: 0 10*3/uL (ref 0.0–0.5)
Eosinophils Relative: 0 %
HCT: 24.2 % — ABNORMAL LOW (ref 36.0–46.0)
Hemoglobin: 7.6 g/dL — ABNORMAL LOW (ref 12.0–15.0)
Immature Granulocytes: 4 %
Lymphocytes Relative: 31 %
Lymphs Abs: 1.7 10*3/uL (ref 0.7–4.0)
MCH: 29.8 pg (ref 26.0–34.0)
MCHC: 31.4 g/dL (ref 30.0–36.0)
MCV: 94.9 fL (ref 80.0–100.0)
Monocytes Absolute: 1.2 10*3/uL — ABNORMAL HIGH (ref 0.1–1.0)
Monocytes Relative: 22 %
Neutro Abs: 2.4 10*3/uL (ref 1.7–7.7)
Neutrophils Relative %: 43 %
Platelets: 122 10*3/uL — ABNORMAL LOW (ref 150–400)
RBC: 2.55 MIL/uL — ABNORMAL LOW (ref 3.87–5.11)
RDW: 17 % — ABNORMAL HIGH (ref 11.5–15.5)
WBC: 5.5 10*3/uL (ref 4.0–10.5)
nRBC: 0 % (ref 0.0–0.2)

## 2020-07-20 LAB — HEPARIN LEVEL (UNFRACTIONATED): Heparin Unfractionated: 0.34 IU/mL (ref 0.30–0.70)

## 2020-07-20 NOTE — Progress Notes (Signed)
ANTICOAGULATION CONSULT NOTE - Initial Consult  Pharmacy Consult for Heparin Infusion Indication: atrial fibrillation  No Known Allergies  Patient Measurements: Height: 5\' 8"  (172.7 cm) Weight: 88.2 kg (194 lb 6.4 oz) IBW/kg (Calculated) : 63.9 Heparin Dosing Weight: 83.1 kg  Vital Signs: Temp: 98.5 F (36.9 C) (11/17 2031) Temp Source: Oral (11/17 2031) BP: 126/66 (11/17 2031) Pulse Rate: 98 (11/17 2031)  Labs: Recent Labs    07/18/20 1228 07/18/20 1228 07/18/20 1431 07/19/20 0854 07/19/20 1633 07/20/20 0111  HGB 8.2*   < >  --  8.3*  --  7.6*  HCT 27.1*  --   --  26.9*  --  24.2*  PLT 142*  --   --  139*  --  122*  APTT  --   --   --   --  34  --   LABPROT  --   --   --   --  15.1  --   INR  --   --   --   --  1.2  --   HEPARINUNFRC  --   --   --   --   --  0.34  CREATININE 1.96*  --   --  2.07*  --   --   TROPONINIHS 17  --  15  --   --   --    < > = values in this interval not displayed.    Estimated Creatinine Clearance: 21.4 mL/min (A) (by C-G formula based on SCr of 2.07 mg/dL (H)).   Medical History: Past Medical History:  Diagnosis Date  . GERD (gastroesophageal reflux disease)   . HOH (hard of hearing)   . Hypertension   . Pneumonia   . Vertigo    Pt is 86 yo female ordered heparin drip for Afib.  Pt initially started on Heparin 5000 units q8hr on 11/16 with last dose given at 1611 today.  HDW: 83.1kg Hgb 8.3 Plt 139  Goal of Therapy:  Heparin level 0.3-0.7 units/ml Monitor platelets by anticoagulation protocol: Yes     Plan:  No bolusto be ordered prior to starting heparin drip.   Ordered stat INR/aPTT.  Start heparin infusion at 1250 units/hr Check anti-Xa level in 8 hours and daily while on heparin Continue to monitor H&H and platelets   11/18: HL @ 0111 = 0.34 Will continue pt on current rate and draw confirmation level in 8 hrs .   Elleah Hemsley D 07/20/2020 1:42 AM

## 2020-07-20 NOTE — Plan of Care (Signed)
  Problem: Safety: Goal: Ability to remain free from injury will improve Outcome: Progressing   Problem: Education: Goal: Ability to demonstrate management of disease process will improve Outcome: Progressing   Problem: Education: Goal: Ability to verbalize understanding of medication therapies will improve Outcome: Progressing

## 2020-07-20 NOTE — Progress Notes (Signed)
   Heart Failure Nurse Navigator Note  HFpEF  55-60%. Normal right ventricular systolic function.  Moderate LAE.  Mild RAE.  Mild to moderate MR.  Moderate TR. Moderate AS.  Co morbidities:  CKD stage IV Chronic anemia New onset Atrial Fibrillation Hypertension Hyperlipidemia  Medications:  Coreg 6.25 mg BID Diltiazem 120 mg daily   Labs:  Sodium 137, potassium 4.1, BUN 40, creatinine 2.18 (2.07)  Hemoglobin 7.6 ( 8.3), hematocrit 24.2.  Intake 480 ml Output 1400 ml  Weight 88.2 kg (90.6) BMI 29.56 BP 130/59 pulse 93, with rates ranging  up to the 120 's   Assessment:  General- she is awake and alert, denies any chest pain or pressure or palpations, still a little SOB.  HEENT- pupils equal, No JVD, non icterus.  Cardiac- heart tones irregular, + systolic murmur.  Chest- breath sounds diminished in the bases  Abdomen- rounded soft.+ bowel sounds.  Musculoskeletal- no lower extremity edema noted.  Psych- is currently tearful but pleasant and appropriate.  Neuro-speech is clear, moves all extremities.    Noted when walking into room patient is tearful and upset.  She does not want colonoscopy.  Verbalized that she  is almost 18, and if it is her time to go to heaven with the Reita Cliche, then she is ready.  She goes on to say that he will fix the problem.  Son then arrived, he wants to discuss with his mom upon discharge that he does not want her going home alone.  Left the room to let them talk, instructed would return later as she verbalized she wants to view heart failure video.   Pricilla Riffle RN, CHFN

## 2020-07-20 NOTE — Progress Notes (Signed)
PROGRESS NOTE   Felicia [name]  XAJ:[id number] DOB: [birth year] DOA: 07/18/2020 PCP: Sofie Hartigan, MD  Brief Narrative:  86 year old white female CKD 3, HTN Previous hospitalization 2018 for HCAP versus aspiration pneumonia Followed by oncologist Dr. Mike Gip for anemia of renal disease?  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 fib  Cardiology consulted She was eventually started on IV heparin GTT cautiously given possible bleeding from rectum as noted in office note 10/27 although she does not recall saying this  Assessment & Plan:   Principal Problem:   Respiratory failure with hypoxia ([place]) Active Problems:   Benign essential hypertension   Kidney disease, chronic, stage IV (GFR 15-29 ml/min) (H[facility])   Atrial fibrillation with RVR ([place])   1. Acute hypoxic respiratory failure 2.  acute decompensated heart failure likely cause see below a. Echo = EF 55-60%-likely has tachycardia mediated heart failure b. Continue for now Lasix 20 IV daily c. Repeat CXR ordered for this a.m. d. Daily weights, I/O -1.9 L weight 90.7-->88.2 3. EF 55-60% this admission 4. Permanent atrial fibrillation paroxysmal CHADS2 score >5 a. A. fib has persisted so she may require anticoagulation b. I discussed the risks and benefits with her and started her on low-dose IV heparin yesterday c. continue Coreg 6.25 twice daily, Cardizem 120 5. 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 Mike Gip a. She will follow up with them in the outpatient setting 6. History of endoscopy 05/2011 7. Externally thrombosed nonbleeding hemorrhoids a. Hemorrhoids not bleeding at this time despite being on IV heparin b. Given her mild  drop in hemoglobin without active bleeding I discussed her case with Dr. Marius Ditch of gastroenterology-given that she has never had a colonoscopy previously and this could be more than just a hemorrhoidal bleed if she drops her hemoglobin or has bleeding while on heparin would scope her this admission c. If not, would coordinate with Dr. Marius Ditch in the outpatient setting while off anticoagulation for outpatient colonoscopy 8. CKD stage IV a. Baseline GFR in the 20 range-cautious diuresis b. Will discontinue or cut back irbesartan and would definitely discontinue clonidine 9. HTN a. Amlodipine discontinued by cardiology-I am holding losartan at this time  DVT prophylaxis: IV heparin Code Status: Full Family Communication: Discussed with Mr. Katheran Awe son [phone] on 07/19/2020 Disposition:  Status is: Inpatient  Remains inpatient appropriate because:Hemodynamically unstable, Persistent severe electrolyte disturbances and Ongoing diagnostic testing needed not appropriate for outpatient work up   Dispo: The patient is from: Home              Anticipated d/c is to: Home              Anticipated d/c date is: 2 days              Patient currently is not medically stable to d/c.       Consultants:   Cardiology  Procedures: Echo, CT, duplex  Antimicrobials: None   Subjective:  Feels somewhat short winded even turning in the bed No chest pain no fever at this time No nausea no vomiting No stool since yesterday and no reports of dark blood from rectum Overall she feels a little  better but think she needs to get up   Objective: Vitals:   07/19/20 2031 07/20/20 0100 07/20/20 0416 07/20/20 0749  BP: 126/66  111/76 (!) 130/59  Pulse: 98  (!) 104 90  Resp: 17  17 19   Temp: 98.5 F (36.9 C)  98.4 F (36.9 C) 98.4 F (36.9 C)  TempSrc: Oral  Oral Oral  SpO2: 95%  94% 92%  Weight:  88.2 kg    Height:        Intake/Output Summary (Last 24 hours) at 07/20/2020 0853 Last data filed at  07/20/2020 0418 Gross per 24 hour  Intake 480 ml  Output 1400 ml  Net -920 ml   Filed Weights   07/18/20 1225 07/19/20 0421 07/20/20 0100  Weight: 90.7 kg 90.6 kg 88.2 kg    Examination:  EOMI NCAT no deficit no icterus no pallor Chest is clear without rales rhonchi Abdomen is soft nontender Rectal exam shows hemorrhoids at 12 3 and 6 PM positions thrombosed Neurologically is intact moving all 4 limbs without deficit Lower extremities are soft supple with no edema She seems a little forgetful    Data Reviewed: I have personally reviewed following labs and imaging studies BUNs/creatinine 44/1.96-->40/2.07-->40/2.18 Iron level 36 saturation ratio is 15 Hemoglobin 8.3-->7.6   Radiology Studies: DG Chest 1 View  Result Date: 07/18/2020 CLINICAL DATA:  86 year old female with shortness of breath EXAM: CHEST  1 VIEW COMPARISON:  09/24/2016 FINDINGS: Cardiomediastinal silhouette unchanged. Low lung volumes persist. Interlobular septal thickening. New blunting of the bilateral costophrenic angles with partial obscuration of the hemidiaphragms. No pneumothorax. Persisting coarsened interstitial opacities. IMPRESSION: Acute CHF with small pleural effusions, with background of chronic lung changes Electronically Signed   By: Corrie Mckusick D.O.   On: 07/18/2020 13:06   CT CHEST WO CONTRAST  Result Date: 07/18/2020 CLINICAL DATA:  Respiratory failure, leg swelling EXAM: CT CHEST WITHOUT CONTRAST TECHNIQUE: Multidetector CT imaging of the chest was performed following the standard protocol without IV contrast. COMPARISON:  None. FINDINGS: Cardiovascular: There is extensive multi-vessel coronary artery calcification. Extensive calcification of the mitral valve annulus. Extensive senescent calcifications of the aortic valve leaflets. Mild global cardiomegaly. No pericardial effusion. The central pulmonary arteries are enlarged in keeping with changes of pulmonary arterial hypertension. Moderate  atherosclerotic calcification within the thoracic aorta. No aortic aneurysm. Mediastinum/Nodes: 15 mm right thyroid nodule noted. There is shotty mediastinal and paraesophageal lymphadenopathy without frank pathologic enlargement. These are nonspecific and may be reactive in nature. The esophagus is patulous, a finding that can be seen with gastroesophageal reflux or esophageal dysmotility. Lungs/Pleura: Moderate bilateral pleural effusions are present with compressive atelectasis of the lower lobes bilaterally. There is smooth interlobular septal thickening, best appreciated at the lung apices, compatible with trace pulmonary edema. No focal consolidation. No central obstructing lesion. No pneumothorax. Upper Abdomen: No acute abnormality. Musculoskeletal: No lytic or blastic bone lesions are seen. IMPRESSION: Trace pulmonary edema and moderate bilateral pleural effusions in keeping with mild cardiogenic failure. Extensive coronary artery calcification. Extensive calcification of the mitral valve annulus and aortic valve leaflets. This can be associated with valvular pathology and would be better assessed with echocardiography. Morphologic changes in keeping with pulmonary arterial hypertension. 15 mm right thyroid nodule. Recommend thyroid US (ref: J Am Coll Radiol. 2015 Feb;12(2): 143-50). Aortic Atherosclerosis (ICD10-I70.0). Electronically Signed   By: Fidela Salisbury MD   On: 07/18/2020 23:13   US Venous Img Lower Bilateral (DVT)  Result Date: 07/19/2020 CLINICAL DATA:  86 year old with  lower extremity edema. EXAM: BILATERAL LOWER EXTREMITY VENOUS DOPPLER ULTRASOUND TECHNIQUE: Gray-scale sonography with graded compression, as well as color Doppler and duplex ultrasound were performed to evaluate the lower extremity deep venous systems from the level of the common femoral vein and including the common femoral, femoral, profunda femoral, popliteal and calf veins including the posterior tibial, peroneal and  gastrocnemius veins when visible. The superficial great saphenous vein was also interrogated. Spectral Doppler was utilized to evaluate flow at rest and with distal augmentation maneuvers in the common femoral, femoral and popliteal veins. COMPARISON:  None. FINDINGS: RIGHT LOWER EXTREMITY Common Femoral Vein: No evidence of thrombus. Normal compressibility, respiratory phasicity and response to augmentation. Saphenofemoral Junction: No evidence of thrombus. Normal compressibility and flow on color Doppler imaging. Profunda Femoral Vein: No evidence of thrombus. Normal compressibility and flow on color Doppler imaging. Femoral Vein: No evidence of thrombus. Normal compressibility, respiratory phasicity and response to augmentation. Popliteal Vein: No evidence of thrombus. Normal compressibility, respiratory phasicity and response to augmentation. Calf Veins: No evidence of thrombus. Normal compressibility and flow on color Doppler imaging. Other Findings: Small fluid collection in the right popliteal fossa that measures 2.9 x 0.8 x 1.6 cm. There was a similar small fluid collection in this area on the prior examination. LEFT LOWER EXTREMITY Common Femoral Vein: No evidence of thrombus. Normal compressibility, respiratory phasicity and response to augmentation. Saphenofemoral Junction: No evidence of thrombus. Normal compressibility and flow on color Doppler imaging. Profunda Femoral Vein: No evidence of thrombus. Normal compressibility and flow on color Doppler imaging. Femoral Vein: No evidence of thrombus. Normal compressibility, respiratory phasicity and response to augmentation. Popliteal Vein: No evidence of thrombus. Normal compressibility, respiratory phasicity and response to augmentation. Calf Veins: No evidence of thrombus. Normal compressibility and flow on color Doppler imaging. Other Findings: Again noted is a complex fluid collection in the left popliteal fossa that measures 3.9 x 1.9 x 2.3 cm.  Previously this collection measured 5.7 x 3.2 x 3.6 cm. IMPRESSION: No evidence of deep venous thrombosis in either lower extremity. Bilateral Baker's cysts, left side greater than right. The left knee Baker's cyst has decreased in size since August 2021. Electronically Signed   By: Markus Daft M.D.   On: 07/19/2020 14:39   ECHOCARDIOGRAM COMPLETE  Result Date: 07/19/2020    ECHOCARDIOGRAM REPORT   Patient Name:   DESANI SPRUNG [facility] Date of Exam: 07/18/2020 Medical Rec #:  [id number]               Height:       68.0 in Accession #:    [id number]              Weight:       200.0 lb Date of Birth:  [birth year]               BSA:          2.044 m Patient Age:    79 years                BP:           107/79 mmHg Patient Gender: F                       HR:           94 bpm. Exam Location:  [facility] Procedure: 2D Echo, Cardiac Doppler and Color Doppler Indications:     H82.99 Acute Diastolic Heart Failure  History:  Patient has no prior history of Echocardiogram examinations.                  Risk Factors:Hypertension. Pneumonia.  Sonographer:     Wilford Sports Rodgers-Jones Referring Phys:  [id number] Clabe Seal Diagnosing Phys: Isaias Cowman MD IMPRESSIONS  1. Left ventricular ejection fraction, by estimation, is 55 to 60%. The left ventricle has normal function. The left ventricle has no regional wall motion abnormalities. There is mild left ventricular hypertrophy. Left ventricular diastolic parameters are indeterminate.  2. Right ventricular systolic function is normal. The right ventricular size is normal.  3. Left atrial size was moderately dilated.  4. Right atrial size was mildly dilated.  5. The mitral valve is normal in structure. Mild to moderate mitral valve regurgitation. No evidence of mitral stenosis.  6. Tricuspid valve regurgitation is moderate.  7. The aortic valve is normal in structure. Aortic valve regurgitation is not visualized. Moderate aortic valve stenosis.  8. The inferior vena cava is  normal in size with greater than 50% respiratory variability, suggesting right atrial pressure of 3 mmHg. FINDINGS  Left Ventricle: Left ventricular ejection fraction, by estimation, is 55 to 60%. The left ventricle has normal function. The left ventricle has no regional wall motion abnormalities. The left ventricular internal cavity size was normal in size. There is  mild left ventricular hypertrophy. Left ventricular diastolic parameters are indeterminate. Right Ventricle: The right ventricular size is normal. No increase in right ventricular wall thickness. Right ventricular systolic function is normal. Left Atrium: Left atrial size was moderately dilated. Right Atrium: Right atrial size was mildly dilated. Pericardium: There is no evidence of pericardial effusion. Mitral Valve: The mitral valve is normal in structure. Mild to moderate mitral valve regurgitation. No evidence of mitral valve stenosis. Tricuspid Valve: The tricuspid valve is normal in structure. Tricuspid valve regurgitation is moderate . No evidence of tricuspid stenosis. Aortic Valve: The aortic valve is normal in structure. Aortic valve regurgitation is not visualized. Moderate aortic stenosis is present. Aortic valve mean gradient measures 17.7 mmHg. Aortic valve peak gradient measures 28.4 mmHg. Aortic valve area, by VTI measures 0.69 cm. Pulmonic Valve: The pulmonic valve was normal in structure. Pulmonic valve regurgitation is not visualized. No evidence of pulmonic stenosis. Aorta: The aortic root is normal in size and structure. Venous: The inferior vena cava is normal in size with greater than 50% respiratory variability, suggesting right atrial pressure of 3 mmHg. IAS/Shunts: No atrial level shunt detected by color flow Doppler.  LEFT VENTRICLE PLAX 2D LVIDd:         4.46 cm LVIDs:         3.07 cm LV PW:         1.17 cm LV IVS:        1.16 cm LVOT diam:     1.80 cm LV SV:         42 LV SV Index:   20 LVOT Area:     2.54 cm  RIGHT  VENTRICLE            IVC RV Basal diam:  3.77 cm    IVC diam: 2.27 cm RV S prime:     9.09 cm/s TAPSE (M-mode): 1.4 cm LEFT ATRIUM              Index       RIGHT ATRIUM           Index LA diam:        6.80 cm  3.33 cm/m  RA Area:     23.20 cm LA Vol (A2C):   192.0 ml 93.95 ml/m RA Volume:   73.70 ml  36.06 ml/m LA Vol (A4C):   144.0 ml 70.46 ml/m LA Biplane Vol: 170.0 ml 83.18 ml/m  AORTIC VALVE AV Area (Vmax):    0.74 cm AV Area (Vmean):   0.77 cm AV Area (VTI):     0.69 cm AV Vmax:           266.67 cm/s AV Vmean:          200.000 cm/s AV VTI:            0.605 m AV Peak Grad:      28.4 mmHg AV Mean Grad:      17.7 mmHg LVOT Vmax:         78.00 cm/s LVOT Vmean:        60.900 cm/s LVOT VTI:          0.163 m LVOT/AV VTI ratio: 0.27  AORTA Ao Root diam: 2.90 cm MV E velocity: 166.75 cm/s  TRICUSPID VALVE                             TR Peak grad:   34.1 mmHg                             TR Vmax:        292.00 cm/s                              SHUNTS                             Systemic VTI:  0.16 m                             Systemic Diam: 1.80 cm Isaias Cowman MD Electronically signed by Isaias Cowman MD Signature Date/Time: 07/19/2020/1:14:49 PM    Final      Scheduled Meds: . carvedilol  6.25 mg Oral BID WC  . diltiazem  120 mg Oral Daily  . mouth rinse  15 mL Mouth Rinse BID  . pantoprazole  40 mg Oral Daily  . sodium chloride flush  3 mL Intravenous Q12H  . sodium chloride flush  3 mL Intravenous Q12H   Continuous Infusions: . sodium chloride       LOS: 2 days    Time spent: [place], MD [facility] To contact the attending provider between 7A-7P or the covering provider during after hours 7P-7A, please log into the web site [email or web address] and access using universal [place] password for that web site. If you do not have the password, please call the hospital operator.  07/20/2020, 8:53 AM

## 2020-07-20 NOTE — Progress Notes (Signed)
Mobility Specialist - Progress Note   07/20/20 1400  Mobility  Range of Motion/Exercises Right leg;Left leg (ankle pumps, hip iso, slr, heel slides, seated march)  Level of Assistance Independent  Assistive Device None  Distance Ambulated (ft) 0 ft  Mobility Response Tolerated well  Mobility performed by Mobility specialist  $Mobility charge 1 Mobility    Pre-mobility: 96 HR, 95% SpO2 During mobility: 86 HR, 91% SpO2 Post-mobility: 95% SpO2   Pt was sitting in recliner utilizing 1.5L [place] O2 upon arrival. Pt agreed to session. Pt denied pain, nausea, or fatigue but declined OOB activity d/t "just having lunch and not wanting to throw up". Pt performed exercises: ankle pumps, hip isometrics, straight leg raises, heel slides, and seated march independently. Overall, pt tolerated session well. Pt was left in recliner with all needs in reach.    Kathee Delton Mobility Specialist 07/20/20, 2:12 PM

## 2020-07-20 NOTE — Progress Notes (Signed)
[facility] Cardiology    SUBJECTIVE: The patient reports feeling about the same this morning. She continues to have some exertional shortness of breath on 2L nasal cannula, denies palpitations or chest pain. She continues to report mild peripheral edema, unchanged from yesterday. She denies presyncope or syncope. She reports fatigue and that she did not sleep well the past two nights. The patient denies any hematochezia, melena, or hematuria.   Vitals:   07/19/20 2031 07/20/20 0100 07/20/20 0416 07/20/20 0749  BP: 126/66  111/76 (!) 130/59  Pulse: 98  (!) 104 90  Resp: 17  17 19   Temp: 98.5 F (36.9 C)  98.4 F (36.9 C) 98.4 F (36.9 C)  TempSrc: Oral  Oral Oral  SpO2: 95%  94% 92%  Weight:  88.2 kg    Height:         Intake/Output Summary (Last 24 hours) at 07/20/2020 0820 Last data filed at 07/20/2020 0418 Gross per 24 hour  Intake 480 ml  Output 1400 ml  Net -920 ml      PHYSICAL EXAM  General: Well developed, well nourished, in no acute distress. Patient is resting in bed comfortably, watching TV.  HEENT:  Normocephalic and atramatic Neck:  No JVD.  Lungs: Diminished breath sounds in bilateral bases. Heart: Irregularly irregular. 2/6 systolic murmur, loudest at mid-left sternal border. Abdomen: No obvious distension  Msk:  Back normal, gait not assesed. Normal strength and tone for age. Extremities: Chronic venous stasis skin changes, 1+ bilateral pedal edema. Neuro: Alert and oriented X 3. Psych:  Good affect, responds appropriately   LABS: Basic Metabolic Panel: Recent Labs    07/18/20 1228 07/18/20 1228 07/19/20 0854 07/20/20 0111  NA 136   < > 137 137  K 4.8   < > 4.2 4.1  CL 103   < > 100 101  CO2 24   < > 26 25  GLUCOSE 118*   < > 93 94  BUN 44*   < > 40* 40*  CREATININE 1.96*   < > 2.07* 2.18*  CALCIUM 8.6*   < > 9.0 8.7*  MG 1.9  --   --   --    < > = values in this interval not displayed.   Liver Function Tests: Recent Labs    07/19/20 0854  07/20/20 0111  AST 12* 15  ALT 7 6  ALKPHOS 63 62  BILITOT 1.0 1.1  PROT 6.8 6.4*  ALBUMIN 3.8 3.4*   No results for input(s): LIPASE, AMYLASE in the last 72 hours. CBC: Recent Labs    07/19/20 0854 07/20/20 0111  WBC 5.6 5.5  NEUTROABS 2.8 2.4  HGB 8.3* 7.6*  HCT 26.9* 24.2*  MCV 96.1 94.9  PLT 139* 122*   Cardiac Enzymes: No results for input(s): CKTOTAL, CKMB, CKMBINDEX, TROPONINI in the last 72 hours. BNP: Invalid input(s): POCBNP D-Dimer: No results for input(s): DDIMER in the last 72 hours. Hemoglobin A1C: Recent Labs    07/18/20 1816  HGBA1C 4.5*   Fasting Lipid Panel: No results for input(s): CHOL, HDL, LDLCALC, TRIG, CHOLHDL, LDLDIRECT in the last 72 hours. Thyroid Function Tests: No results for input(s): TSH, T4TOTAL, T3FREE, THYROIDAB in the last 72 hours.  Invalid input(s): FREET3 Anemia Panel: Recent Labs    07/18/20 1816  VITAMINB12 957*  FOLATE 12.7  FERRITIN 420*  TIBC 246*  IRON 36  RETICCTPCT 5.7*    DG Chest 1 View  Result Date: 07/18/2020 CLINICAL DATA:  86 year old female with shortness  of breath EXAM: CHEST  1 VIEW COMPARISON:  09/24/2016 FINDINGS: Cardiomediastinal silhouette unchanged. Low lung volumes persist. Interlobular septal thickening. New blunting of the bilateral costophrenic angles with partial obscuration of the hemidiaphragms. No pneumothorax. Persisting coarsened interstitial opacities. IMPRESSION: Acute CHF with small pleural effusions, with background of chronic lung changes Electronically Signed   By: Corrie Mckusick D.O.   On: 07/18/2020 13:06   CT CHEST WO CONTRAST  Result Date: 07/18/2020 CLINICAL DATA:  Respiratory failure, leg swelling EXAM: CT CHEST WITHOUT CONTRAST TECHNIQUE: Multidetector CT imaging of the chest was performed following the standard protocol without IV contrast. COMPARISON:  None. FINDINGS: Cardiovascular: There is extensive multi-vessel coronary artery calcification. Extensive calcification of  the mitral valve annulus. Extensive senescent calcifications of the aortic valve leaflets. Mild global cardiomegaly. No pericardial effusion. The central pulmonary arteries are enlarged in keeping with changes of pulmonary arterial hypertension. Moderate atherosclerotic calcification within the thoracic aorta. No aortic aneurysm. Mediastinum/Nodes: 15 mm right thyroid nodule noted. There is shotty mediastinal and paraesophageal lymphadenopathy without frank pathologic enlargement. These are nonspecific and may be reactive in nature. The esophagus is patulous, a finding that can be seen with gastroesophageal reflux or esophageal dysmotility. Lungs/Pleura: Moderate bilateral pleural effusions are present with compressive atelectasis of the lower lobes bilaterally. There is smooth interlobular septal thickening, best appreciated at the lung apices, compatible with trace pulmonary edema. No focal consolidation. No central obstructing lesion. No pneumothorax. Upper Abdomen: No acute abnormality. Musculoskeletal: No lytic or blastic bone lesions are seen. IMPRESSION: Trace pulmonary edema and moderate bilateral pleural effusions in keeping with mild cardiogenic failure. Extensive coronary artery calcification. Extensive calcification of the mitral valve annulus and aortic valve leaflets. This can be associated with valvular pathology and would be better assessed with echocardiography. Morphologic changes in keeping with pulmonary arterial hypertension. 15 mm right thyroid nodule. Recommend thyroid US (ref: J Am Coll Radiol. 2015 Feb;12(2): 143-50). Aortic Atherosclerosis (ICD10-I70.0). Electronically Signed   By: Fidela Salisbury MD   On: 07/18/2020 23:13   US Venous Img Lower Bilateral (DVT)  Result Date: 07/19/2020 CLINICAL DATA:  86 year old with lower extremity edema. EXAM: BILATERAL LOWER EXTREMITY VENOUS DOPPLER ULTRASOUND TECHNIQUE: Gray-scale sonography with graded compression, as well as color Doppler and  duplex ultrasound were performed to evaluate the lower extremity deep venous systems from the level of the common femoral vein and including the common femoral, femoral, profunda femoral, popliteal and calf veins including the posterior tibial, peroneal and gastrocnemius veins when visible. The superficial great saphenous vein was also interrogated. Spectral Doppler was utilized to evaluate flow at rest and with distal augmentation maneuvers in the common femoral, femoral and popliteal veins. COMPARISON:  None. FINDINGS: RIGHT LOWER EXTREMITY Common Femoral Vein: No evidence of thrombus. Normal compressibility, respiratory phasicity and response to augmentation. Saphenofemoral Junction: No evidence of thrombus. Normal compressibility and flow on color Doppler imaging. Profunda Femoral Vein: No evidence of thrombus. Normal compressibility and flow on color Doppler imaging. Femoral Vein: No evidence of thrombus. Normal compressibility, respiratory phasicity and response to augmentation. Popliteal Vein: No evidence of thrombus. Normal compressibility, respiratory phasicity and response to augmentation. Calf Veins: No evidence of thrombus. Normal compressibility and flow on color Doppler imaging. Other Findings: Small fluid collection in the right popliteal fossa that measures 2.9 x 0.8 x 1.6 cm. There was a similar small fluid collection in this area on the prior examination. LEFT LOWER EXTREMITY Common Femoral Vein: No evidence of thrombus. Normal compressibility, respiratory phasicity and response to  augmentation. Saphenofemoral Junction: No evidence of thrombus. Normal compressibility and flow on color Doppler imaging. Profunda Femoral Vein: No evidence of thrombus. Normal compressibility and flow on color Doppler imaging. Femoral Vein: No evidence of thrombus. Normal compressibility, respiratory phasicity and response to augmentation. Popliteal Vein: No evidence of thrombus. Normal compressibility, respiratory  phasicity and response to augmentation. Calf Veins: No evidence of thrombus. Normal compressibility and flow on color Doppler imaging. Other Findings: Again noted is a complex fluid collection in the left popliteal fossa that measures 3.9 x 1.9 x 2.3 cm. Previously this collection measured 5.7 x 3.2 x 3.6 cm. IMPRESSION: No evidence of deep venous thrombosis in either lower extremity. Bilateral Baker's cysts, left side greater than right. The left knee Baker's cyst has decreased in size since August 2021. Electronically Signed   By: Markus Daft M.D.   On: 07/19/2020 14:39   ECHOCARDIOGRAM COMPLETE  Result Date: 07/19/2020    ECHOCARDIOGRAM REPORT   Patient Name:   Felicia [name] [facility] Date of Exam: 07/18/2020 Medical Rec #:  [id number]               Height:       68.0 in Accession #:    [id number]              Weight:       200.0 lb Date of Birth:  01-09-1929               BSA:          2.044 m Patient Age:    36 years                BP:           107/79 mmHg Patient Gender: F                       HR:           94 bpm. Exam Location:  [facility] Procedure: 2D Echo, Cardiac Doppler and Color Doppler Indications:     O53.66 Acute Diastolic Heart Failure  History:         Patient has no prior history of Echocardiogram examinations.                  Risk Factors:Hypertension. Pneumonia.  Sonographer:     Wilford Sports Rodgers-Jones Referring Phys:  [id number] Clabe Seal Diagnosing Phys: Isaias Cowman MD IMPRESSIONS  1. Left ventricular ejection fraction, by estimation, is 55 to 60%. The left ventricle has normal function. The left ventricle has no regional wall motion abnormalities. There is mild left ventricular hypertrophy. Left ventricular diastolic parameters are indeterminate.  2. Right ventricular systolic function is normal. The right ventricular size is normal.  3. Left atrial size was moderately dilated.  4. Right atrial size was mildly dilated.  5. The mitral valve is normal in structure. Mild to moderate  mitral valve regurgitation. No evidence of mitral stenosis.  6. Tricuspid valve regurgitation is moderate.  7. The aortic valve is normal in structure. Aortic valve regurgitation is not visualized. Moderate aortic valve stenosis.  8. The inferior vena cava is normal in size with greater than 50% respiratory variability, suggesting right atrial pressure of 3 mmHg. FINDINGS  Left Ventricle: Left ventricular ejection fraction, by estimation, is 55 to 60%. The left ventricle has normal function. The left ventricle has no regional wall motion abnormalities. The left ventricular internal cavity size was normal in size. There is  mild left ventricular  hypertrophy. Left ventricular diastolic parameters are indeterminate. Right Ventricle: The right ventricular size is normal. No increase in right ventricular wall thickness. Right ventricular systolic function is normal. Left Atrium: Left atrial size was moderately dilated. Right Atrium: Right atrial size was mildly dilated. Pericardium: There is no evidence of pericardial effusion. Mitral Valve: The mitral valve is normal in structure. Mild to moderate mitral valve regurgitation. No evidence of mitral valve stenosis. Tricuspid Valve: The tricuspid valve is normal in structure. Tricuspid valve regurgitation is moderate . No evidence of tricuspid stenosis. Aortic Valve: The aortic valve is normal in structure. Aortic valve regurgitation is not visualized. Moderate aortic stenosis is present. Aortic valve mean gradient measures 17.7 mmHg. Aortic valve peak gradient measures 28.4 mmHg. Aortic valve area, by VTI measures 0.69 cm. Pulmonic Valve: The pulmonic valve was normal in structure. Pulmonic valve regurgitation is not visualized. No evidence of pulmonic stenosis. Aorta: The aortic root is normal in size and structure. Venous: The inferior vena cava is normal in size with greater than 50% respiratory variability, suggesting right atrial pressure of 3 mmHg. IAS/Shunts: No  atrial level shunt detected by color flow Doppler.  LEFT VENTRICLE PLAX 2D LVIDd:         4.46 cm LVIDs:         3.07 cm LV PW:         1.17 cm LV IVS:        1.16 cm LVOT diam:     1.80 cm LV SV:         42 LV SV Index:   20 LVOT Area:     2.54 cm  RIGHT VENTRICLE            IVC RV Basal diam:  3.77 cm    IVC diam: 2.27 cm RV S prime:     9.09 cm/s TAPSE (M-mode): 1.4 cm LEFT ATRIUM              Index       RIGHT ATRIUM           Index LA diam:        6.80 cm  3.33 cm/m  RA Area:     23.20 cm LA Vol (A2C):   192.0 ml 93.95 ml/m RA Volume:   73.70 ml  36.06 ml/m LA Vol (A4C):   144.0 ml 70.46 ml/m LA Biplane Vol: 170.0 ml 83.18 ml/m  AORTIC VALVE AV Area (Vmax):    0.74 cm AV Area (Vmean):   0.77 cm AV Area (VTI):     0.69 cm AV Vmax:           266.67 cm/s AV Vmean:          200.000 cm/s AV VTI:            0.605 m AV Peak Grad:      28.4 mmHg AV Mean Grad:      17.7 mmHg LVOT Vmax:         78.00 cm/s LVOT Vmean:        60.900 cm/s LVOT VTI:          0.163 m LVOT/AV VTI ratio: 0.27  AORTA Ao Root diam: 2.90 cm MV E velocity: 166.75 cm/s  TRICUSPID VALVE                             TR Peak grad:   34.1 mmHg  TR Vmax:        292.00 cm/s                              SHUNTS                             Systemic VTI:  0.16 m                             Systemic Diam: 1.80 cm Isaias Cowman MD Electronically signed by Isaias Cowman MD Signature Date/Time: 07/19/2020/1:14:49 PM    Final      Echo: normal left ventricular function with LVEF 55-60%, mild LVH, mild to moderate mitral regurgitation, moderate tricuspid regurgitaiton, moderate aortic stenosis   TELEMETRY: atrial fibrillation, low 100s  ASSESSMENT AND PLAN:  Principal Problem:   Respiratory failure with hypoxia (HCC) Active Problems:   Benign essential hypertension   Kidney disease, chronic, stage IV (GFR 15-29 ml/min) (HCC)   Atrial fibrillation with RVR (HCC)    1.New onset atrial fibrillation,  with chads vasc score of 5, adequately rate controlled in the setting of acute anemia (88-108 bpm). Patient denies palpitations. 2. Acute CHF, presenting with 1.5+ month history of progressive exertional shortness of breath, orthopnea, and weight gain. She has intermittent lower extremity edema with a 1 day history of increased peripheral edema. Peripheral edema improved with IV Lasix 80 mg. Chest CT with acute CHF trace pulmonary edema and moderate bilateral pleural effusions. Most recent 2D echocardiogram with normal left ventricular function with LVEF 55-60%, mild LVH, moderate AS, mild to moderate MR, moderate TR. High sensitivity troponin normal (17 -> 15), BNP elevated to 651. 3. Chronic anemia of chronic kidney disease stage IV on Retacrit, followed by hematology/oncology, recent history of BRBPR pending hemoccult stool. Hemoglobin dropped to 7.6 (was 8.3 on 07/19/2020), hematocrit 24.2, platelets 122. Patient was started on heparin.   Recommendations: 1. Discontinue heparin drip in light of worsening anemia and hemoglobin of 7 2. Recommend blood transfusion due to drop in hemoglobin and AKI on CKD 3. Consider nephrology involvement for diuresis recommendations 4. Defer chronic anticoagulation at this time in light of anemia 5. Continue carvedilol 6.25 mg BID and Cardizem CD 120 mg for rate control and hypertension   Clabe Seal, PA-C 07/20/2020 8:20 AM  Discussed with Dr. Saralyn Pilar and the plan was made in collaboration with him.

## 2020-07-21 ENCOUNTER — Inpatient Hospital Stay: Payer: Medicare Other

## 2020-07-21 DIAGNOSIS — J9601 Acute respiratory failure with hypoxia: Secondary | ICD-10-CM | POA: Diagnosis not present

## 2020-07-21 LAB — COMPREHENSIVE METABOLIC PANEL WITH GFR
ALT: 7 U/L (ref 0–44)
AST: 15 U/L (ref 15–41)
Albumin: 3.6 g/dL (ref 3.5–5.0)
Alkaline Phosphatase: 62 U/L (ref 38–126)
Anion gap: 10 (ref 5–15)
BUN: 39 mg/dL — ABNORMAL HIGH (ref 8–23)
CO2: 25 mmol/L (ref 22–32)
Calcium: 8.7 mg/dL — ABNORMAL LOW (ref 8.9–10.3)
Chloride: 103 mmol/L (ref 98–111)
Creatinine, Ser: 2.11 mg/dL — ABNORMAL HIGH (ref 0.44–1.00)
GFR, Estimated: 22 mL/min — ABNORMAL LOW
Glucose, Bld: 107 mg/dL — ABNORMAL HIGH (ref 70–99)
Potassium: 4.1 mmol/L (ref 3.5–5.1)
Sodium: 138 mmol/L (ref 135–145)
Total Bilirubin: 0.9 mg/dL (ref 0.3–1.2)
Total Protein: 6.7 g/dL (ref 6.5–8.1)

## 2020-07-21 LAB — CBC WITH DIFFERENTIAL/PLATELET
Abs Immature Granulocytes: 0.34 10*3/uL — ABNORMAL HIGH (ref 0.00–0.07)
Basophils Absolute: 0 10*3/uL (ref 0.0–0.1)
Basophils Relative: 0 %
Eosinophils Absolute: 0 10*3/uL (ref 0.0–0.5)
Eosinophils Relative: 0 %
HCT: 25.4 % — ABNORMAL LOW (ref 36.0–46.0)
Hemoglobin: 8 g/dL — ABNORMAL LOW (ref 12.0–15.0)
Immature Granulocytes: 6 %
Lymphocytes Relative: 24 %
Lymphs Abs: 1.3 10*3/uL (ref 0.7–4.0)
MCH: 30.1 pg (ref 26.0–34.0)
MCHC: 31.5 g/dL (ref 30.0–36.0)
MCV: 95.5 fL (ref 80.0–100.0)
Monocytes Absolute: 1.3 10*3/uL — ABNORMAL HIGH (ref 0.1–1.0)
Monocytes Relative: 23 %
Neutro Abs: 2.6 10*3/uL (ref 1.7–7.7)
Neutrophils Relative %: 47 %
Platelets: 129 10*3/uL — ABNORMAL LOW (ref 150–400)
RBC: 2.66 MIL/uL — ABNORMAL LOW (ref 3.87–5.11)
RDW: 16.8 % — ABNORMAL HIGH (ref 11.5–15.5)
Smear Review: NORMAL
WBC: 5.5 10*3/uL (ref 4.0–10.5)
nRBC: 0 % (ref 0.0–0.2)

## 2020-07-21 MED ORDER — FUROSEMIDE 10 MG/ML IJ SOLN
40.0000 mg | Freq: Once | INTRAMUSCULAR | Status: AC
  Administered 2020-07-21: 40 mg via INTRAVENOUS
  Filled 2020-07-21: qty 4

## 2020-07-21 MED ORDER — DILTIAZEM HCL ER COATED BEADS 180 MG PO CP24
180.0000 mg | ORAL_CAPSULE | Freq: Every day | ORAL | Status: DC
  Administered 2020-07-21 – 2020-07-24 (×4): 180 mg via ORAL
  Filled 2020-07-21 (×4): qty 1

## 2020-07-21 MED ORDER — ENOXAPARIN SODIUM 30 MG/0.3ML ~~LOC~~ SOLN
30.0000 mg | SUBCUTANEOUS | Status: DC
  Administered 2020-07-21 – 2020-07-23 (×3): 30 mg via SUBCUTANEOUS
  Filled 2020-07-21 (×3): qty 0.3

## 2020-07-21 NOTE — Progress Notes (Signed)
SATURATION QUALIFICATIONS: (This note is used to comply with regulatory documentation for home oxygen)  Patient Saturations on Room Air at Rest = 88%      

## 2020-07-21 NOTE — Progress Notes (Signed)
[facility] Cardiology  SUBJECTIVE: Patient laying in bed, denies chest pain or shortness of breath   Vitals:   07/20/20 1547 07/20/20 2000 07/21/20 0431 07/21/20 0749  BP: 119/60 114/75 103/79 101/61  Pulse: 83 (!) 109 72 72  Resp: 19     Temp: 98.2 F (36.8 C) 98.5 F (36.9 C) 98.5 F (36.9 C) 98.6 F (37 C)  TempSrc: Oral Oral Oral Oral  SpO2: 96% 92% 91% (!) 85%  Weight:   87.6 kg   Height:         Intake/Output Summary (Last 24 hours) at 07/21/2020 0751 Last data filed at 07/20/2020 1830 Gross per 24 hour  Intake 600 ml  Output --  Net 600 ml      PHYSICAL EXAM  General: Well developed, well nourished, in no acute distress HEENT:  Normocephalic and atramatic Neck:  No JVD.  Lungs: Clear bilaterally to auscultation and percussion. Heart: HRRR . Normal S1 and S2 without gallops or murmurs.  Abdomen: Bowel sounds are positive, abdomen soft and non-tender  Msk:  Back normal, normal gait. Normal strength and tone for age. Extremities: No clubbing, cyanosis or edema.   Neuro: Alert and oriented X 3. Psych:  Good affect, responds appropriately   LABS: Basic Metabolic Panel: Recent Labs    07/18/20 1228 07/19/20 0854 07/20/20 0111 07/21/20 0604  NA 136   < > 137 138  K 4.8   < > 4.1 4.1  CL 103   < > 101 103  CO2 24   < > 25 25  GLUCOSE 118*   < > 94 107*  BUN 44*   < > 40* 39*  CREATININE 1.96*   < > 2.18* 2.11*  CALCIUM 8.6*   < > 8.7* 8.7*  MG 1.9  --   --   --    < > = values in this interval not displayed.   Liver Function Tests: Recent Labs    07/20/20 0111 07/21/20 0604  AST 15 15  ALT 6 7  ALKPHOS 62 62  BILITOT 1.1 0.9  PROT 6.4* 6.7  ALBUMIN 3.4* 3.6   No results for input(s): LIPASE, AMYLASE in the last 72 hours. CBC: Recent Labs    07/20/20 0111 07/21/20 0604  WBC 5.5 5.5  NEUTROABS 2.4 2.6  HGB 7.6* 8.0*  HCT 24.2* 25.4*  MCV 94.9 95.5  PLT 122* 129*   Cardiac Enzymes: No results for input(s): CKTOTAL, CKMB, CKMBINDEX,  TROPONINI in the last 72 hours. BNP: Invalid input(s): POCBNP D-Dimer: No results for input(s): DDIMER in the last 72 hours. Hemoglobin A1C: Recent Labs    07/18/20 1816  HGBA1C 4.5*   Fasting Lipid Panel: No results for input(s): CHOL, HDL, LDLCALC, TRIG, CHOLHDL, LDLDIRECT in the last 72 hours. Thyroid Function Tests: No results for input(s): TSH, T4TOTAL, T3FREE, THYROIDAB in the last 72 hours.  Invalid input(s): FREET3 Anemia Panel: Recent Labs    07/18/20 1816  VITAMINB12 957*  FOLATE 12.7  FERRITIN 420*  TIBC 246*  IRON 36  RETICCTPCT 5.7*    DG Chest 2 View  Result Date: 07/21/2020 CLINICAL DATA:  Pleural effusion.  History of respiratory failure. EXAM: CHEST - 2 VIEW COMPARISON:  CT chest 07/18/2020.  Chest x-ray 07/18/2020. FINDINGS: Stable cardiomegaly. Interim partial resolution of bilateral interstitial prominence suggesting resolving interstitial edema. Bibasilar subsegmental atelectasis. Stable small bilateral pleural effusions. No pneumothorax no acute bony abnormality. IMPRESSION: 1. Stable cardiomegaly. Interim partial resolution of bilateral interstitial prominence suggesting resolving interstitial  edema. Stable small bilateral pleural effusions. 2.  Bibasilar subsegmental atelectasis. Electronically Signed   By: Marcello Moores  Register   On: 07/21/2020 07:04   US Venous Img Lower Bilateral (DVT)  Result Date: 07/19/2020 CLINICAL DATA:  86 year old with lower extremity edema. EXAM: BILATERAL LOWER EXTREMITY VENOUS DOPPLER ULTRASOUND TECHNIQUE: Gray-scale sonography with graded compression, as well as color Doppler and duplex ultrasound were performed to evaluate the lower extremity deep venous systems from the level of the common femoral vein and including the common femoral, femoral, profunda femoral, popliteal and calf veins including the posterior tibial, peroneal and gastrocnemius veins when visible. The superficial great saphenous vein was also interrogated.  Spectral Doppler was utilized to evaluate flow at rest and with distal augmentation maneuvers in the common femoral, femoral and popliteal veins. COMPARISON:  None. FINDINGS: RIGHT LOWER EXTREMITY Common Femoral Vein: No evidence of thrombus. Normal compressibility, respiratory phasicity and response to augmentation. Saphenofemoral Junction: No evidence of thrombus. Normal compressibility and flow on color Doppler imaging. Profunda Femoral Vein: No evidence of thrombus. Normal compressibility and flow on color Doppler imaging. Femoral Vein: No evidence of thrombus. Normal compressibility, respiratory phasicity and response to augmentation. Popliteal Vein: No evidence of thrombus. Normal compressibility, respiratory phasicity and response to augmentation. Calf Veins: No evidence of thrombus. Normal compressibility and flow on color Doppler imaging. Other Findings: Small fluid collection in the right popliteal fossa that measures 2.9 x 0.8 x 1.6 cm. There was a similar small fluid collection in this area on the prior examination. LEFT LOWER EXTREMITY Common Femoral Vein: No evidence of thrombus. Normal compressibility, respiratory phasicity and response to augmentation. Saphenofemoral Junction: No evidence of thrombus. Normal compressibility and flow on color Doppler imaging. Profunda Femoral Vein: No evidence of thrombus. Normal compressibility and flow on color Doppler imaging. Femoral Vein: No evidence of thrombus. Normal compressibility, respiratory phasicity and response to augmentation. Popliteal Vein: No evidence of thrombus. Normal compressibility, respiratory phasicity and response to augmentation. Calf Veins: No evidence of thrombus. Normal compressibility and flow on color Doppler imaging. Other Findings: Again noted is a complex fluid collection in the left popliteal fossa that measures 3.9 x 1.9 x 2.3 cm. Previously this collection measured 5.7 x 3.2 x 3.6 cm. IMPRESSION: No evidence of deep venous  thrombosis in either lower extremity. Bilateral Baker's cysts, left side greater than right. The left knee Baker's cyst has decreased in size since August 2021. Electronically Signed   By: Markus Daft M.D.   On: 07/19/2020 14:39     Echo LVEF 55 to 60%, moderate aortic stenosis, by echocardiogram 07/18/2020  TELEMETRY: Atrial fibrillation 90 to 100 bpm:  ASSESSMENT AND PLAN:  Principal Problem:   Respiratory failure with hypoxia (HCC) Active Problems:   Benign essential hypertension   Kidney disease, chronic, stage IV (GFR 15-29 ml/min) (HCC)   Atrial fibrillation with RVR (HCC)    1.  Persistent atrial fibrillation, new onset, asymptomatic, chads vas score 5, heart rate mildly elevated on carvedilol and Cardizem.  Chronic anticoagulation deferred at this time due to chronic anemia with hemoglobin 8.0 today. 2.  Acute on chronic diastolic congestive heart failure, 1 to 34-month history of progressive exertional dyspnea, orthopnea, fluid retention and weight gain.  Also has intermittent peripheral edema, improved after initial furosemide 80 mg IV.  2D echocardiogram 07/18/2020 revealed normal left ventricular function, with LVEF 55 to 60%, moderate aortic stenosis, mild to moderate mitral regurgitation. 3.  Chronic kidney disease stage IV, BUN and creatinine 39 and 2.11, respectively  4.  Anemia of chronic disease, in the setting of chronic kidney disease, on Retacrit, followed by hematology, with recent history of BRBPR, with recent drop of hemoglobin to 7.6, improved to 8.0 today, started on heparin, which was discontinued our recommendation  Recommendations  1.  Agree with overall current therapy 2.  Would defer heparin drip in light of baseline anemia, and thrombocytopenia.  In addition, heparin drip would be a transitional therapy towards chronic anticoagulation which I will defer to Dr. Nehemiah Massed who is her outpatient cardiologist. 3.  Uptitrate Cardizem CD to 180 mg daily for better rate  control 4.  Additional dose of furosemide 40 mg IV once, today   Isaias Cowman, MD, PhD, [facility] 07/21/2020 7:51 AM

## 2020-07-21 NOTE — Progress Notes (Signed)
PROGRESS NOTE   Felicia [name]  TKP:[id number] DOB: November 05, 1928 DOA: 07/18/2020 PCP: Sofie Hartigan, MD  Brief Narrative:   86 year old white female CKD 3, HTN Previous hospitalization 2018 for HCAP versus aspiration pneumonia Followed by oncologist Dr. Mike Gip for anemia of renal disease?  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 fib  Cardiology consulted Patient started on heparin GTT but then discontinued by cardiology who are currently managing her heart failure and A. fib Will have outpatient reevaluation regarding anticoagulation  Assessment & Plan:   Principal Problem:   Respiratory failure with hypoxia ([place]) Active Problems:   Benign essential hypertension   Kidney disease, chronic, stage IV (GFR 15-29 ml/min) (HCC)   Atrial fibrillation with RVR ([place])  1. Acute hypoxic respiratory failure 2.  acute decompensated heart failure likely cause see below a. Echo = EF 55-60%-likely has tachycardia mediated heart failure b. 11/19 CXR shows resolution of interstitial edema c. Daily weights, I/O shows +240 weight 90.7-->88.2-->87.6 d. Cardiology giving IV Lasix 40 today, likely back to home med in am and possible d/c 3. EF 55-60% this admission 4. Permanent atrial fibrillation paroxysmal CHADS2 score >5 a. A. fib has persisted so she may require anticoagulation which will be discussed as an outpatient with her cardiologist b. continue Coreg 6.25 twice daily, Cardizem 120 5. 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 Mike Gip a. She will follow up with them in the outpatient setting 6. History of endoscopy 05/2011 7. Externally thrombosed nonbleeding hemorrhoids 8. Never had  colonoscopy a. Hemorrhoids not bleeding at this time despite being on IV heparin b. Given drop in hemoglobin without active bleeding D/W 11/18 Dr. Marius Ditch of gastroenterology- c. given that she has never had a colonoscopy previously and this could be more than just a hemorrhoidal bleed however hemoglobin has stabilized d. Needs outpatient coordination with Dr. Marius Ditch in the outpatient setting while off anticoagulation for outpatient colonoscopy  9. CKD stage IV a. Baseline GFR in the 20 range-cautious diuresis b. Will discontinue or cut back irbesartan and would definitely discontinue clonidine 10. HTN a. Amlodipine discontinued by cardiology-I am holding losartan at this time  DVT prophylaxis:  Sq heparin Code Status: Full Family Communication: Discussed with Mr. Katheran Awe son [phone] on 07/21/2020 Disposition:  Status is: Inpatient  Remains inpatient appropriate because:Hemodynamically unstable, Persistent severe electrolyte disturbances and Ongoing diagnostic testing needed not appropriate for outpatient work up  Dispo: The patient is from: Home              Anticipated d/c is to: Home              Anticipated d/c date is: 2 days              Patient currently is not medically stable to d/c.   Consultants:   Cardiology  Procedures: Echo, CT, duplex  Antimicrobials: None  Subjective:  Well no cp, f, chills n eating   Objective: Vitals:   07/20/20 1547 07/20/20 2000 07/21/20 0431 07/21/20 0749  BP: 119/60 114/75 103/79 101/61  Pulse: 83 (!) 109 72 72  Resp: 19     Temp: 98.2 F (36.8 C) 98.5 F (36.9 C) 98.5 F (  36.9 C) 98.6 F (37 C)  TempSrc: Oral Oral Oral Oral  SpO2: 96% 92% 91% 90%  Weight:   87.6 kg   Height:        Intake/Output Summary (Last 24 hours) at 07/21/2020 1117 Last data filed at 07/21/2020 1032 Gross per 24 hour  Intake 600 ml  Output --  Net 600 ml   Filed Weights   07/19/20 0421 07/20/20 0100 07/21/20 0431  Weight: 90.6 kg 88.2 kg 87.6  kg    Examination:  eomi ncat no focal deficit on oxygen cta b afib s1 s2 + HSM  abd soft no rebound  Neuro intact  Data Reviewed: I have personally reviewed following labs and imaging studies  Hemoglobin 7.6-8.0 BUNs/creatinine 40/2.18-->39/2.11  Radiology Studies: DG Chest 2 View  Result Date: 07/21/2020 CLINICAL DATA:  Pleural effusion.  History of respiratory failure. EXAM: CHEST - 2 VIEW COMPARISON:  CT chest 07/18/2020.  Chest x-ray 07/18/2020. FINDINGS: Stable cardiomegaly. Interim partial resolution of bilateral interstitial prominence suggesting resolving interstitial edema. Bibasilar subsegmental atelectasis. Stable small bilateral pleural effusions. No pneumothorax no acute bony abnormality. IMPRESSION: 1. Stable cardiomegaly. Interim partial resolution of bilateral interstitial prominence suggesting resolving interstitial edema. Stable small bilateral pleural effusions. 2.  Bibasilar subsegmental atelectasis. Electronically Signed   By: Marcello Moores  Register   On: 07/21/2020 07:04   US Venous Img Lower Bilateral (DVT)  Result Date: 07/19/2020 CLINICAL DATA:  86 year old with lower extremity edema. EXAM: BILATERAL LOWER EXTREMITY VENOUS DOPPLER ULTRASOUND TECHNIQUE: Gray-scale sonography with graded compression, as well as color Doppler and duplex ultrasound were performed to evaluate the lower extremity deep venous systems from the level of the common femoral vein and including the common femoral, femoral, profunda femoral, popliteal and calf veins including the posterior tibial, peroneal and gastrocnemius veins when visible. The superficial great saphenous vein was also interrogated. Spectral Doppler was utilized to evaluate flow at rest and with distal augmentation maneuvers in the common femoral, femoral and popliteal veins. COMPARISON:  None. FINDINGS: RIGHT LOWER EXTREMITY Common Femoral Vein: No evidence of thrombus. Normal compressibility, respiratory phasicity and response to  augmentation. Saphenofemoral Junction: No evidence of thrombus. Normal compressibility and flow on color Doppler imaging. Profunda Femoral Vein: No evidence of thrombus. Normal compressibility and flow on color Doppler imaging. Femoral Vein: No evidence of thrombus. Normal compressibility, respiratory phasicity and response to augmentation. Popliteal Vein: No evidence of thrombus. Normal compressibility, respiratory phasicity and response to augmentation. Calf Veins: No evidence of thrombus. Normal compressibility and flow on color Doppler imaging. Other Findings: Small fluid collection in the right popliteal fossa that measures 2.9 x 0.8 x 1.6 cm. There was a similar small fluid collection in this area on the prior examination. LEFT LOWER EXTREMITY Common Femoral Vein: No evidence of thrombus. Normal compressibility, respiratory phasicity and response to augmentation. Saphenofemoral Junction: No evidence of thrombus. Normal compressibility and flow on color Doppler imaging. Profunda Femoral Vein: No evidence of thrombus. Normal compressibility and flow on color Doppler imaging. Femoral Vein: No evidence of thrombus. Normal compressibility, respiratory phasicity and response to augmentation. Popliteal Vein: No evidence of thrombus. Normal compressibility, respiratory phasicity and response to augmentation. Calf Veins: No evidence of thrombus. Normal compressibility and flow on color Doppler imaging. Other Findings: Again noted is a complex fluid collection in the left popliteal fossa that measures 3.9 x 1.9 x 2.3 cm. Previously this collection measured 5.7 x 3.2 x 3.6 cm. IMPRESSION: No evidence of deep venous thrombosis in either lower extremity. Bilateral Baker's  cysts, left side greater than right. The left knee Baker's cyst has decreased in size since August 2021. Electronically Signed   By: Markus Daft M.D.   On: 07/19/2020 14:39     Scheduled Meds: . carvedilol  6.25 mg Oral BID WC  . diltiazem  180 mg  Oral Daily  . mouth rinse  15 mL Mouth Rinse BID  . pantoprazole  40 mg Oral Daily  . sodium chloride flush  3 mL Intravenous Q12H  . sodium chloride flush  3 mL Intravenous Q12H   Continuous Infusions: . sodium chloride       LOS: 3 days    Time spent: 79  Nita Sells, MD [facility] To contact the attending provider between 7A-7P or the covering provider during after hours 7P-7A, please log into the web site [email or web address] and access using universal [place] password for that web site. If you do not have the password, please call the hospital operator.  07/21/2020, 11:17 AM

## 2020-07-21 NOTE — Evaluation (Signed)
Physical Therapy Evaluation Patient Details Name: Felicia [name] MRN: [id number] DOB: Oct 21, 1928 Today's Date: 07/21/2020   History of Present Illness  Pt is an 86 yo female with PMH that includes HTN, CKD IV, anemia of CKD, and CML.  Pt presented with sob for about 3 weeks and found to be in A-fib.  MD assessment includes: acute respiratory failure with hypoxia, HTN, acute on chronic diastolic congestive heart failure, anemia of chronic disease, and A-fib.    Clinical Impression  Pt was pleasant and motivated to participate during the session.  Pt required multiple rocking attempts and min A to stand from her recliner.  Pt was able to amb 40 feet with slow cadence but was steady without LOB; pt fatigued after 40 feet and required to return to sitting on 1LO2/min with SpO2 90% and HR 110 compared to 94%/99bpm at baseline.  Pt then able to amb 20 feet maximum after resting with similar vital response.  Pt has no consistent help at home along with 8 steps to enter her home and would not be safe to return to her prior living situation at this time. Pt will benefit from PT services in a SNF setting upon discharge to safely address deficits listed in patient problem list for decreased caregiver assistance and eventual return to PLOF.      Follow Up Recommendations SNF    Equipment Recommendations  None recommended by PT    Recommendations for Other Services       Precautions / Restrictions Precautions Precautions: Fall Restrictions Weight Bearing Restrictions: No Other Position/Activity Restrictions: Watch SpO2      Mobility  Bed Mobility               General bed mobility comments: NT, pt in recliner    Transfers Overall transfer level: Needs assistance Equipment used: Rolling walker (2 wheeled) Transfers: Sit to/from Stand Sit to Stand: Min assist         General transfer comment: Pt required multiple rocking attempts and min A to  stand  Ambulation/Gait Ambulation/Gait assistance: Min guard Gait Distance (Feet): 40 Feet x 1, 20 Feet x 1 Assistive device: Rolling walker (2 wheeled) Gait Pattern/deviations: Step-through pattern;Decreased step length - right;Decreased step length - left Gait velocity: decreased   General Gait Details: Slow cadence but steady without LOB; fatigued after 40 feet and required to return to sitting on 1LO2/min with SpO2 90% and HR 110 compared to 94%/99bpm at baseline  Stairs            Wheelchair Mobility    Modified Rankin (Stroke Patients Only)       Balance Overall balance assessment: Needs assistance Sitting-balance support: Feet supported Sitting balance-Leahy Scale: Good     Standing balance support: Bilateral upper extremity supported;During functional activity Standing balance-Leahy Scale: Good                               Pertinent Vitals/Pain Pain Assessment: No/denies pain    Home Living Family/patient expects to be discharged to:: Private residence Living Arrangements: Alone Available Help at Discharge: Family;Available PRN/intermittently Type of Home: Mobile home Home Access: Stairs to enter Entrance Stairs-Rails: Right;Left;Can reach both Entrance Stairs-Number of Steps: 8 Home Layout: One level Home Equipment: Walker - 2 wheels;Cane - single point      Prior Function Level of Independence: Independent         Comments: Ind amb limited community distances without an  AD but can walk community distances with a shopping cart, drives, grocery shops, has a buggy that she pulls groceries up her stairs with, Ind with ADLs, 2 falls in the last 6 months secondary to [place]        Extremity/Trunk Assessment   Upper Extremity Assessment Upper Extremity Assessment: Generalized weakness    Lower Extremity Assessment Lower Extremity Assessment: Generalized weakness       Communication   Communication: No  difficulties  Cognition Arousal/Alertness: Awake/alert Behavior During Therapy: WFL for tasks assessed/performed Overall Cognitive Status: Within Functional Limits for tasks assessed                                        General Comments      Exercises Total Joint Exercises Ankle Circles/Pumps: AROM;Strengthening;Both;10 reps;5 reps (with manual resistance) Quad Sets: Strengthening;Both;10 reps Gluteal Sets: Strengthening;Both;10 reps Towel Squeeze: Strengthening;Both;10 reps Hip ABduction/ADduction: AROM;Strengthening;Both;10 reps Long Arc Quad: AROM;Strengthening;Both;10 reps Knee Flexion: AROM;Strengthening;Both;10 reps Other Exercises Other Exercises: HEP education for BLE APs, QS, GS, and LAQs x 10 each 5-6x/day   Assessment/Plan    PT Assessment Patient needs continued PT services  PT Problem List Decreased strength;Decreased activity tolerance;Decreased balance;Decreased mobility       PT Treatment Interventions DME instruction;Gait training;Stair training;Functional mobility training;Therapeutic activities;Therapeutic exercise;Balance training;Patient/family education    PT Goals (Current goals can be found in the Care Plan section)  Acute Rehab PT Goals Patient Stated Goal: To get back home PT Goal Formulation: With patient Time For Goal Achievement: 08/03/20 Potential to Achieve Goals: Fair    Frequency Min 2X/week   Barriers to discharge Inaccessible home environment;Decreased caregiver support      Co-evaluation               AM-PAC PT "6 Clicks" Mobility  Outcome Measure Help needed turning from your back to your side while in a flat bed without using bedrails?: A Little Help needed moving from lying on your back to sitting on the side of a flat bed without using bedrails?: A Little Help needed moving to and from a bed to a chair (including a wheelchair)?: A Little Help needed standing up from a chair using your arms (e.g.,  wheelchair or bedside chair)?: A Little Help needed to walk in hospital room?: A Little Help needed climbing 3-5 steps with a railing? : A Lot 6 Click Score: 17    End of Session Equipment Utilized During Treatment: Gait belt;Oxygen Activity Tolerance: Patient tolerated treatment well Patient left: in chair;with call bell/phone within reach;with chair alarm set Nurse Communication: Mobility status PT Visit Diagnosis: History of falling (Z91.81);Muscle weakness (generalized) (M62.81);Difficulty in walking, not elsewhere classified (R26.2)    Time: 7062-3762 PT Time Calculation (min) (ACUTE ONLY): 45 min   Charges:   PT Evaluation $PT Eval Moderate Complexity: 1 Mod PT Treatments $Therapeutic Exercise: 8-22 mins        D. Scott Demetry Bendickson PT, DPT 07/21/20, 4:20 PM

## 2020-07-21 NOTE — Care Management Important Message (Signed)
Important Message  Patient Details  Name: Felicia [name] MRN: [id number] Date of Birth: 10-08-1928   Medicare Important Message Given:  Yes     Dannette Barbara 07/21/2020, 12:27 PM

## 2020-07-21 NOTE — Progress Notes (Signed)
Mobility Specialist - Progress Note   07/21/20 1400  Mobility  Activity Ambulated in room  Level of Assistance Standby assist, set-up cues, supervision of patient - no hands on  Assistive Device Front wheel walker  Distance Ambulated (ft) 40 ft  Mobility Response Tolerated well  Mobility performed by Mobility specialist  $Mobility charge 1 Mobility    Pre-mobility: 73 HR, 88% SpO2 During mobility: 81% SpO2 Post-mobility: 67 HR, 92% SpO2   Pt was sitting in recliner upon arrival utilizing room air. Pt agreed to session. VS were taken, O2 sats at 88%. No heavy breathing noted. Pt utilized PLB technique. Pt stated that she does not have SOB at rest, but does feel SOB with exertion. O2 sat up to 94% prior to activity. CGA donned for safety. Pt was minA to stand with RW. Pt ambulated 20' in room before O2 desat to 81%. A standing rest break was taken. Pt continued ambulation in room before c/o feeling weak in both UE and LE. Pt was able to voice her need to sit. Pt's O2 at rest post-activity sat at 84%. [place] was reapplied on 1L. Mobility notified nurse and was instructed to leave pt on 1L [place] O2. Overall, pt tolerated session well. Pt was left in recliner with all needs in reach with O2 at 92% before exit.    Kathee Delton Mobility Specialist 07/21/20, 2:58 PM

## 2020-07-22 DIAGNOSIS — J9601 Acute respiratory failure with hypoxia: Secondary | ICD-10-CM | POA: Diagnosis not present

## 2020-07-22 LAB — CBC WITH DIFFERENTIAL/PLATELET
Abs Immature Granulocytes: 0.39 10*3/uL — ABNORMAL HIGH (ref 0.00–0.07)
Basophils Absolute: 0 10*3/uL (ref 0.0–0.1)
Basophils Relative: 0 %
Eosinophils Absolute: 0 10*3/uL (ref 0.0–0.5)
Eosinophils Relative: 1 %
HCT: 25.1 % — ABNORMAL LOW (ref 36.0–46.0)
Hemoglobin: 7.9 g/dL — ABNORMAL LOW (ref 12.0–15.0)
Immature Granulocytes: 6 %
Lymphocytes Relative: 24 %
Lymphs Abs: 1.5 10*3/uL (ref 0.7–4.0)
MCH: 29.6 pg (ref 26.0–34.0)
MCHC: 31.5 g/dL (ref 30.0–36.0)
MCV: 94 fL (ref 80.0–100.0)
Monocytes Absolute: 1.7 10*3/uL — ABNORMAL HIGH (ref 0.1–1.0)
Monocytes Relative: 27 %
Neutro Abs: 2.6 10*3/uL (ref 1.7–7.7)
Neutrophils Relative %: 42 %
Platelets: 124 10*3/uL — ABNORMAL LOW (ref 150–400)
RBC: 2.67 MIL/uL — ABNORMAL LOW (ref 3.87–5.11)
RDW: 16.7 % — ABNORMAL HIGH (ref 11.5–15.5)
Smear Review: NORMAL
WBC: 6.2 10*3/uL (ref 4.0–10.5)
nRBC: 0 % (ref 0.0–0.2)

## 2020-07-22 LAB — COMPREHENSIVE METABOLIC PANEL
ALT: 7 U/L (ref 0–44)
AST: 18 U/L (ref 15–41)
Albumin: 3.8 g/dL (ref 3.5–5.0)
Alkaline Phosphatase: 61 U/L (ref 38–126)
Anion gap: 10 (ref 5–15)
BUN: 41 mg/dL — ABNORMAL HIGH (ref 8–23)
CO2: 27 mmol/L (ref 22–32)
Calcium: 9.1 mg/dL (ref 8.9–10.3)
Chloride: 103 mmol/L (ref 98–111)
Creatinine, Ser: 2.15 mg/dL — ABNORMAL HIGH (ref 0.44–1.00)
GFR, Estimated: 21 mL/min — ABNORMAL LOW (ref >60)
Glucose, Bld: 92 mg/dL (ref 70–99)
Potassium: 3.7 mmol/L (ref 3.5–5.1)
Sodium: 140 mmol/L (ref 135–145)
Total Bilirubin: 0.8 mg/dL (ref 0.3–1.2)
Total Protein: 6.8 g/dL (ref 6.5–8.1)

## 2020-07-22 NOTE — Progress Notes (Signed)
Mobility Specialist - Progress Note   07/22/20 1135  Mobility  Activity Ambulated in room  Level of Assistance Standby assist, set-up cues, supervision of patient - no hands on (CGA for safety)  Assistive Device Front wheel walker  Distance Ambulated (ft) 25 ft  Mobility Response Tolerated well  Mobility performed by Mobility specialist  $Mobility charge 1 Mobility    Pre-mobility: 83 HR, 97% SpO2 Post-mobility:86 HR, 94% SpO2  Pt sitting on recliner upon arrival. Pt agreed to session. Pt ambulated 25' total in room w/ RW SBA. CGA for safety. No LOB noted. Pt slight SOB. O2 sat >/= 90% during ambulation. Pt on 1L O2 [place]. Overall, pt tolerated session well. Pt pleasant. Pt left sitting on recliner w/ alarm set. All needs placed in reach. Visitor entered the room at the end of session. Nurse was notified.    Cristel Rail Mobility Specialist  07/22/20, 11:39 AM

## 2020-07-22 NOTE — Progress Notes (Signed)
PROGRESS NOTE   Felicia [name]  LKG:[id number] DOB: [birth year] DOA: 07/18/2020 PCP: Sofie Hartigan, MD  Brief Narrative:   86 year old white female CKD 3, HTN Previous hospitalization 2018 for HCAP versus aspiration pneumonia Followed by oncologist Dr. Mike Gip for anemia of renal disease?  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 fib  Cardiology consulted Patient started on heparin GTT but then discontinued by cardiology who are currently managing her heart failure and A. fib Will have outpatient reevaluation regarding anticoagulation  Assessment & Plan:   Principal Problem:   Respiratory failure with hypoxia ([place]) Active Problems:   Benign essential hypertension   Kidney disease, chronic, stage IV (GFR 15-29 ml/min) (HCC)   Atrial fibrillation with RVR ([place])  1. Acute hypoxic respiratory failure 2.  acute decompensated heart failure likely cause see below a. Echo = EF 55-60%-likely has tachycardia mediated heart failure b. 11/19 CXR shows resolution of interstitial edema c. Daily weights, I/O shows -320 cc weight 90.7--> 89.5 d. Received IV Lasix this admission-transition to p.o. Lasix 40 daily for 1 week and then consideration an outpatient setting for every other day dosing 3. EF 55-60% this admission 4. Permanent atrial fibrillation paroxysmal CHADS2 score >5 new onset this admission a. Might require anticoagulation which will be discussed as an outpatient with her cardiologist b. continue Coreg 6.25 twice daily, Cardizem 120 5. 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 Mike Gip a. She will follow up with them in the outpatient setting and receives Procrit, last dose was  11/8 b. Iron studies this admission showed saturation slightly low so needs recheck in 3 weeks at oncology office 6. History of endoscopy 05/2011 7. Externally thrombosed nonbleeding hemorrhoids 8. Never had colonoscopy a. Hemorrhoids but nonbleeding b. Given drop in hemoglobin without active bleeding D/W 11/18 Dr. Marius Ditch of gastroenterology who will schedule for outpatient colonoscopy c. Hemoglobin is stable 7-8 range 9. CKD stage IV a. Baseline GFR in the 20 range-cautious diuresis b. cut back irbesartan and would definitely discontinue clonidine 10. HTN a. Amlodipine discontinued by cardiology-I am holding losartan at this time  DVT prophylaxis:  Sq heparin Code Status: Full Family Communication: Discussed with Mr. Katheran Awe son [phone] on 07/21/2020 Disposition:  Status is: Inpatient  Remains inpatient appropriate because:Hemodynamically unstable, Persistent severe electrolyte disturbances and Ongoing diagnostic testing needed not appropriate for outpatient work up  Dispo: The patient is from: Home              Anticipated d/c is to: SNF              Anticipated d/c date is: 2 days likely on Monday              Patient currently is not medically stable to d/c.   Consultants:   Cardiology  Procedures: Echo, CT, duplex  Antimicrobials: None  Subjective: Pleasant awake alert sitting up in bed no distress Tolerated full diet and has been ambulatory in the room Does not feel swollen Passed a regular stool today which was nonbloody    Objective: Vitals:   07/22/20 0334 07/22/20 0335 07/22/20 0459 07/22/20 0829  BP: 118/62   114/66  Pulse: (!) 108 95  86  Resp: 16   19  Temp: 97.6 F (36.4 C)     TempSrc: Oral     SpO2: (!) 87% (!) 87%  97%  Weight:   89.5 kg   Height:        Intake/Output Summary (Last 24 hours) at 07/22/2020 1107 Last data filed at 07/21/2020 1402 Gross per 24 hour  Intake 240 ml  Output --  Net 240 ml   Filed Weights   07/20/20 0100  07/21/20 0431 07/22/20 0459  Weight: 88.2 kg 87.6 kg 89.5 kg    Examination:  eomi ncat no focal deficit on oxygen and seems quite comfortable with no added sound rales rhonchi cta b without any rales rhonchi afib s1 s2 + HSM  abd soft no rebound  Neuro intact  Data Reviewed: I have personally reviewed following labs and imaging studies  Hemoglobin 7.6-8.0-->7.9 BUNs/creatinine 40/2.18-->39/2.11--urine/potassium 3.7  Radiology Studies: DG Chest 2 View  Result Date: 07/21/2020 CLINICAL DATA:  Pleural effusion.  History of respiratory failure. EXAM: CHEST - 2 VIEW COMPARISON:  CT chest 07/18/2020.  Chest x-ray 07/18/2020. FINDINGS: Stable cardiomegaly. Interim partial resolution of bilateral interstitial prominence suggesting resolving interstitial edema. Bibasilar subsegmental atelectasis. Stable small bilateral pleural effusions. No pneumothorax no acute bony abnormality. IMPRESSION: 1. Stable cardiomegaly. Interim partial resolution of bilateral interstitial prominence suggesting resolving interstitial edema. Stable small bilateral pleural effusions. 2.  Bibasilar subsegmental atelectasis. Electronically Signed   By: Marcello Moores  Register   On: 07/21/2020 07:04     Scheduled Meds: . carvedilol  6.25 mg Oral BID WC  . diltiazem  180 mg Oral Daily  . enoxaparin (LOVENOX) injection  30 mg Subcutaneous Q24H  . mouth rinse  15 mL Mouth Rinse BID  . pantoprazole  40 mg Oral Daily  . sodium chloride flush  3 mL Intravenous Q12H  . sodium chloride flush  3 mL Intravenous Q12H   Continuous Infusions: . sodium chloride       LOS: 4 days    Time spent: 68  Nita Sells, MD [facility] To contact the attending provider between 7A-7P or the covering provider during after hours 7P-7A, please log into the web site [email or web address] and access using universal [place] password for that web site. If you do not have the password, please call the hospital operator.  07/22/2020,  11:07 AM

## 2020-07-22 NOTE — Progress Notes (Signed)
[facility] Encounter Note  Patient: Felicia [name] [facility] / Admit Date: 07/18/2020 / Date of Encounter: 07/22/2020, 7:08 AM   Subjective: 11/20 the patient has slept very well overnight with no current evidence of significant new cardiovascular symptoms.  There is no evidence of cough congestion or anginal symptoms.  The patient has had much better heart rate and blood pressure control with current medical regimen changes including increased diltiazem with carvedilol dose.  The patient continues to have a low hemoglobin of 8 and glomerular filtration rate of 22. Echocardiogram shows normal LV systolic function with ejection fraction of 60% no evidence of significant valvular heart disease contributing to below Review of Systems: Positive for: None Negative for: Vision change, hearing change, syncope, dizziness, nausea, vomiting,diarrhea, bloody stool, stomach pain, cough, congestion, diaphoresis, urinary frequency, urinary pain,skin lesions, skin rashes Others previously listed  Objective: Telemetry: Atrial fibrillation with controlled ventricular rate Physical Exam: Blood pressure 118/62, pulse 95, temperature 97.6 F (36.4 C), temperature source Oral, resp. rate 16, height 5\' 8"  (1.727 m), weight 89.5 kg, SpO2 (!) 87 %. Body mass index is 30.01 kg/m. General: Well developed, well nourished, in no acute distress. Head: Normocephalic, atraumatic, sclera non-icteric, no xanthomas, nares are without discharge. Neck: No apparent masses Lungs: Normal respirations with few wheezes, no rhonchi, no rales , no crackles   Heart: Irregular rate and rhythm, normal S1 S2, no murmur, no rub, no gallop, PMI is normal size and placement, carotid upstroke normal without bruit, jugular venous pressure normal Abdomen: Soft, non-tender, non-distended with normoactive bowel sounds. No hepatosplenomegaly. Abdominal aorta is normal size without bruit Extremities: Trace edema, no  clubbing, no cyanosis, no ulcers,  Peripheral: 2+ radial, 2+ femoral, 2+ dorsal pedal pulses Neuro: Alert and oriented. Moves all extremities spontaneously. Psych:  Responds to questions appropriately with a normal affect.   Intake/Output Summary (Last 24 hours) at 07/22/2020 0708 Last data filed at 07/21/2020 1402 Gross per 24 hour  Intake 480 ml  Output 800 ml  Net -320 ml    Inpatient Medications:  . carvedilol  6.25 mg Oral BID WC  . diltiazem  180 mg Oral Daily  . enoxaparin (LOVENOX) injection  30 mg Subcutaneous Q24H  . mouth rinse  15 mL Mouth Rinse BID  . pantoprazole  40 mg Oral Daily  . sodium chloride flush  3 mL Intravenous Q12H  . sodium chloride flush  3 mL Intravenous Q12H   Infusions:  . sodium chloride      Labs: Recent Labs    07/20/20 0111 07/21/20 0604  NA 137 138  K 4.1 4.1  CL 101 103  CO2 25 25  GLUCOSE 94 107*  BUN 40* 39*  CREATININE 2.18* 2.11*  CALCIUM 8.7* 8.7*   Recent Labs    07/20/20 0111 07/21/20 0604  AST 15 15  ALT 6 7  ALKPHOS 62 62  BILITOT 1.1 0.9  PROT 6.4* 6.7  ALBUMIN 3.4* 3.6   Recent Labs    07/21/20 0604 07/22/20 0547  WBC 5.5 6.2  NEUTROABS 2.6 2.6  HGB 8.0* 7.9*  HCT 25.4* 25.1*  MCV 95.5 94.0  PLT 129* 124*   No results for input(s): CKTOTAL, CKMB, TROPONINI in the last 72 hours. Invalid input(s): POCBNP No results for input(s): HGBA1C in the last 72 hours.   Weights: Filed Weights   07/20/20 0100 07/21/20 0431 07/22/20 0459  Weight: 88.2 kg 87.6 kg 89.5 kg     Radiology/Studies:  DG Chest 1 View  Result Date: 07/18/2020 CLINICAL DATA:  86 year old female with shortness of breath EXAM: CHEST  1 VIEW COMPARISON:  09/24/2016 FINDINGS: Cardiomediastinal silhouette unchanged. Low lung volumes persist. Interlobular septal thickening. New blunting of the bilateral costophrenic angles with partial obscuration of the hemidiaphragms. No pneumothorax. Persisting coarsened interstitial opacities.  IMPRESSION: Acute CHF with small pleural effusions, with background of chronic lung changes Electronically Signed   By: Corrie Mckusick D.O.   On: 07/18/2020 13:06   DG Chest 2 View  Result Date: 07/21/2020 CLINICAL DATA:  Pleural effusion.  History of respiratory failure. EXAM: CHEST - 2 VIEW COMPARISON:  CT chest 07/18/2020.  Chest x-ray 07/18/2020. FINDINGS: Stable cardiomegaly. Interim partial resolution of bilateral interstitial prominence suggesting resolving interstitial edema. Bibasilar subsegmental atelectasis. Stable small bilateral pleural effusions. No pneumothorax no acute bony abnormality. IMPRESSION: 1. Stable cardiomegaly. Interim partial resolution of bilateral interstitial prominence suggesting resolving interstitial edema. Stable small bilateral pleural effusions. 2.  Bibasilar subsegmental atelectasis. Electronically Signed   By: Marcello Moores  Register   On: 07/21/2020 07:04   CT CHEST WO CONTRAST  Result Date: 07/18/2020 CLINICAL DATA:  Respiratory failure, leg swelling EXAM: CT CHEST WITHOUT CONTRAST TECHNIQUE: Multidetector CT imaging of the chest was performed following the standard protocol without IV contrast. COMPARISON:  None. FINDINGS: Cardiovascular: There is extensive multi-vessel coronary artery calcification. Extensive calcification of the mitral valve annulus. Extensive senescent calcifications of the aortic valve leaflets. Mild global cardiomegaly. No pericardial effusion. The central pulmonary arteries are enlarged in keeping with changes of pulmonary arterial hypertension. Moderate atherosclerotic calcification within the thoracic aorta. No aortic aneurysm. Mediastinum/Nodes: 15 mm right thyroid nodule noted. There is shotty mediastinal and paraesophageal lymphadenopathy without frank pathologic enlargement. These are nonspecific and may be reactive in nature. The esophagus is patulous, a finding that can be seen with gastroesophageal reflux or esophageal dysmotility.  Lungs/Pleura: Moderate bilateral pleural effusions are present with compressive atelectasis of the lower lobes bilaterally. There is smooth interlobular septal thickening, best appreciated at the lung apices, compatible with trace pulmonary edema. No focal consolidation. No central obstructing lesion. No pneumothorax. Upper Abdomen: No acute abnormality. Musculoskeletal: No lytic or blastic bone lesions are seen. IMPRESSION: Trace pulmonary edema and moderate bilateral pleural effusions in keeping with mild cardiogenic failure. Extensive coronary artery calcification. Extensive calcification of the mitral valve annulus and aortic valve leaflets. This can be associated with valvular pathology and would be better assessed with echocardiography. Morphologic changes in keeping with pulmonary arterial hypertension. 15 mm right thyroid nodule. Recommend thyroid US (ref: J Am Coll Radiol. 2015 Feb;12(2): 143-50). Aortic Atherosclerosis (ICD10-I70.0). Electronically Signed   By: Fidela Salisbury MD   On: 07/18/2020 23:13   US Venous Img Lower Bilateral (DVT)  Result Date: 07/19/2020 CLINICAL DATA:  86 year old with lower extremity edema. EXAM: BILATERAL LOWER EXTREMITY VENOUS DOPPLER ULTRASOUND TECHNIQUE: Gray-scale sonography with graded compression, as well as color Doppler and duplex ultrasound were performed to evaluate the lower extremity deep venous systems from the level of the common femoral vein and including the common femoral, femoral, profunda femoral, popliteal and calf veins including the posterior tibial, peroneal and gastrocnemius veins when visible. The superficial great saphenous vein was also interrogated. Spectral Doppler was utilized to evaluate flow at rest and with distal augmentation maneuvers in the common femoral, femoral and popliteal veins. COMPARISON:  None. FINDINGS: RIGHT LOWER EXTREMITY Common Femoral Vein: No evidence of thrombus. Normal compressibility, respiratory phasicity and response  to augmentation. Saphenofemoral Junction: No evidence of thrombus. Normal compressibility and  flow on color Doppler imaging. Profunda Femoral Vein: No evidence of thrombus. Normal compressibility and flow on color Doppler imaging. Femoral Vein: No evidence of thrombus. Normal compressibility, respiratory phasicity and response to augmentation. Popliteal Vein: No evidence of thrombus. Normal compressibility, respiratory phasicity and response to augmentation. Calf Veins: No evidence of thrombus. Normal compressibility and flow on color Doppler imaging. Other Findings: Small fluid collection in the right popliteal fossa that measures 2.9 x 0.8 x 1.6 cm. There was a similar small fluid collection in this area on the prior examination. LEFT LOWER EXTREMITY Common Femoral Vein: No evidence of thrombus. Normal compressibility, respiratory phasicity and response to augmentation. Saphenofemoral Junction: No evidence of thrombus. Normal compressibility and flow on color Doppler imaging. Profunda Femoral Vein: No evidence of thrombus. Normal compressibility and flow on color Doppler imaging. Femoral Vein: No evidence of thrombus. Normal compressibility, respiratory phasicity and response to augmentation. Popliteal Vein: No evidence of thrombus. Normal compressibility, respiratory phasicity and response to augmentation. Calf Veins: No evidence of thrombus. Normal compressibility and flow on color Doppler imaging. Other Findings: Again noted is a complex fluid collection in the left popliteal fossa that measures 3.9 x 1.9 x 2.3 cm. Previously this collection measured 5.7 x 3.2 x 3.6 cm. IMPRESSION: No evidence of deep venous thrombosis in either lower extremity. Bilateral Baker's cysts, left side greater than right. The left knee Baker's cyst has decreased in size since August 2021. Electronically Signed   By: Markus Daft M.D.   On: 07/19/2020 14:39   ECHOCARDIOGRAM COMPLETE  Result Date: 07/19/2020    ECHOCARDIOGRAM REPORT    Patient Name:   AIDAH FORQUER [facility] Date of Exam: 07/18/2020 Medical Rec #:  [id number]               Height:       68.0 in Accession #:    [id number]              Weight:       200.0 lb Date of Birth:  [birth year]               BSA:          2.044 m Patient Age:    86 years                BP:           107/79 mmHg Patient Gender: F                       HR:           94 bpm. Exam Location:  [facility] Procedure: 2D Echo, Cardiac Doppler and Color Doppler Indications:     D40.81 Acute Diastolic Heart Failure  History:         Patient has no prior history of Echocardiogram examinations.                  Risk Factors:Hypertension. Pneumonia.  Sonographer:     Wilford Sports Rodgers-Jones Referring Phys:  [id number] Clabe Seal Diagnosing Phys: Isaias Cowman MD IMPRESSIONS  1. Left ventricular ejection fraction, by estimation, is 55 to 60%. The left ventricle has normal function. The left ventricle has no regional wall motion abnormalities. There is mild left ventricular hypertrophy. Left ventricular diastolic parameters are indeterminate.  2. Right ventricular systolic function is normal. The right ventricular size is normal.  3. Left atrial size was moderately dilated.  4. Right atrial size was mildly dilated.  5. The mitral  valve is normal in structure. Mild to moderate mitral valve regurgitation. No evidence of mitral stenosis.  6. Tricuspid valve regurgitation is moderate.  7. The aortic valve is normal in structure. Aortic valve regurgitation is not visualized. Moderate aortic valve stenosis.  8. The inferior vena cava is normal in size with greater than 50% respiratory variability, suggesting right atrial pressure of 3 mmHg. FINDINGS  Left Ventricle: Left ventricular ejection fraction, by estimation, is 55 to 60%. The left ventricle has normal function. The left ventricle has no regional wall motion abnormalities. The left ventricular internal cavity size was normal in size. There is  mild left ventricular  hypertrophy. Left ventricular diastolic parameters are indeterminate. Right Ventricle: The right ventricular size is normal. No increase in right ventricular wall thickness. Right ventricular systolic function is normal. Left Atrium: Left atrial size was moderately dilated. Right Atrium: Right atrial size was mildly dilated. Pericardium: There is no evidence of pericardial effusion. Mitral Valve: The mitral valve is normal in structure. Mild to moderate mitral valve regurgitation. No evidence of mitral valve stenosis. Tricuspid Valve: The tricuspid valve is normal in structure. Tricuspid valve regurgitation is moderate . No evidence of tricuspid stenosis. Aortic Valve: The aortic valve is normal in structure. Aortic valve regurgitation is not visualized. Moderate aortic stenosis is present. Aortic valve mean gradient measures 17.7 mmHg. Aortic valve peak gradient measures 28.4 mmHg. Aortic valve area, by VTI measures 0.69 cm. Pulmonic Valve: The pulmonic valve was normal in structure. Pulmonic valve regurgitation is not visualized. No evidence of pulmonic stenosis. Aorta: The aortic root is normal in size and structure. Venous: The inferior vena cava is normal in size with greater than 50% respiratory variability, suggesting right atrial pressure of 3 mmHg. IAS/Shunts: No atrial level shunt detected by color flow Doppler.  LEFT VENTRICLE PLAX 2D LVIDd:         4.46 cm LVIDs:         3.07 cm LV PW:         1.17 cm LV IVS:        1.16 cm LVOT diam:     1.80 cm LV SV:         42 LV SV Index:   20 LVOT Area:     2.54 cm  RIGHT VENTRICLE            IVC RV Basal diam:  3.77 cm    IVC diam: 2.27 cm RV S prime:     9.09 cm/s TAPSE (M-mode): 1.4 cm LEFT ATRIUM              Index       RIGHT ATRIUM           Index LA diam:        6.80 cm  3.33 cm/m  RA Area:     23.20 cm LA Vol (A2C):   192.0 ml 93.95 ml/m RA Volume:   73.70 ml  36.06 ml/m LA Vol (A4C):   144.0 ml 70.46 ml/m LA Biplane Vol: 170.0 ml 83.18 ml/m  AORTIC  VALVE AV Area (Vmax):    0.74 cm AV Area (Vmean):   0.77 cm AV Area (VTI):     0.69 cm AV Vmax:           266.67 cm/s AV Vmean:          200.000 cm/s AV VTI:            0.605 m AV Peak Grad:  28.4 mmHg AV Mean Grad:      17.7 mmHg LVOT Vmax:         78.00 cm/s LVOT Vmean:        60.900 cm/s LVOT VTI:          0.163 m LVOT/AV VTI ratio: 0.27  AORTA Ao Root diam: 2.90 cm MV E velocity: 166.75 cm/s  TRICUSPID VALVE                             TR Peak grad:   34.1 mmHg                             TR Vmax:        292.00 cm/s                              SHUNTS                             Systemic VTI:  0.16 m                             Systemic Diam: 1.80 cm Isaias Cowman MD Electronically signed by Isaias Cowman MD Signature Date/Time: 07/19/2020/1:14:49 PM    Final      Assessment and Recommendation  86 y.o. female with known hypertension hyperlipidemia chronic kidney disease stage IV with acute anemia and new onset of atrial fibrillation with rapid ventricular rate now more controlled on appropriate medication management without evidence of myocardial infarction or congestive heart failure 1.  Continuation of current combination of medication management including diltiazem and carvedilol and will follow with ambulation to adjust dosage of diltiazem for heart rate control between 60 and 90 bpm at rest 2.  Continue to avoid anticoagulation at this time due to concerns of severe anemia chronic kidney disease and possible causes of these at this time.  Will consider further addition of anticoagulation when able 3.  No further cardiac diagnostics necessary at this time due to no evidence of significant LV systolic dysfunction anginal symptoms or congestive heart failure 4.  Begin ambulation and follow for improvements of symptoms and possible discharge to home when stable  Signed, Serafina Royals M.D. FACC

## 2020-07-22 NOTE — Plan of Care (Signed)
  Problem: Education: Goal: Knowledge of General Education information will improve Description: Including pain rating scale, medication(s)/side effects and non-pharmacologic comfort measures Outcome: Progressing   Problem: Health Behavior/Discharge Planning: Goal: Ability to manage health-related needs will improve Outcome: Progressing   Problem: Clinical Measurements: Goal: Ability to maintain clinical measurements within normal limits will improve Outcome: Progressing Goal: Will remain free from infection Outcome: Progressing Goal: Diagnostic test results will improve Outcome: Progressing Goal: Respiratory complications will improve Outcome: Progressing Goal: Cardiovascular complication will be avoided Outcome: Progressing   Problem: Activity: Goal: Risk for activity intolerance will decrease Outcome: Progressing   Problem: Nutrition: Goal: Adequate nutrition will be maintained Outcome: Progressing   Problem: Coping: Goal: Level of anxiety will decrease Outcome: Progressing   Problem: Elimination: Goal: Will not experience complications related to bowel motility Outcome: Progressing Goal: Will not experience complications related to urinary retention Outcome: Progressing   Problem: Pain Managment: Goal: General experience of comfort will improve Outcome: Progressing   Problem: Safety: Goal: Ability to remain free from injury will improve Outcome: Progressing   Problem: Skin Integrity: Goal: Risk for impaired skin integrity will decrease Outcome: Progressing   Problem: Education: Goal: Ability to demonstrate management of disease process will improve Outcome: Progressing Goal: Ability to verbalize understanding of medication therapies will improve Outcome: Progressing Goal: Individualized Educational Video(s) Outcome: Progressing   Problem: Activity: Goal: Capacity to carry out activities will improve Outcome: Progressing   Problem: Cardiac: Goal:  Ability to achieve and maintain adequate cardiopulmonary perfusion will improve Outcome: Progressing   Problem: Education: Goal: Ability to demonstrate management of disease process will improve Outcome: Progressing Goal: Ability to verbalize understanding of medication therapies will improve Outcome: Progressing Goal: Individualized Educational Video(s) Outcome: Progressing   Problem: Activity: Goal: Capacity to carry out activities will improve Outcome: Progressing

## 2020-07-23 DIAGNOSIS — I5033 Acute on chronic diastolic (congestive) heart failure: Secondary | ICD-10-CM

## 2020-07-23 DIAGNOSIS — N184 Chronic kidney disease, stage 4 (severe): Secondary | ICD-10-CM

## 2020-07-23 DIAGNOSIS — J9601 Acute respiratory failure with hypoxia: Secondary | ICD-10-CM | POA: Diagnosis not present

## 2020-07-23 DIAGNOSIS — I4821 Permanent atrial fibrillation: Secondary | ICD-10-CM | POA: Diagnosis not present

## 2020-07-23 DIAGNOSIS — D638 Anemia in other chronic diseases classified elsewhere: Secondary | ICD-10-CM

## 2020-07-23 DIAGNOSIS — D6869 Other thrombophilia: Secondary | ICD-10-CM

## 2020-07-23 DIAGNOSIS — R531 Weakness: Secondary | ICD-10-CM

## 2020-07-23 MED ORDER — FUROSEMIDE 10 MG/ML IJ SOLN
20.0000 mg | Freq: Once | INTRAMUSCULAR | Status: AC
  Administered 2020-07-23: 20 mg via INTRAVENOUS
  Filled 2020-07-23: qty 2

## 2020-07-23 NOTE — Progress Notes (Signed)
Explained to patient need for measuring urine.   Per patient- she has urgency incontinence, but she states the purewick does not work.  She was adamant about wearing a pull-up.  Tried to explain to patient about the risk for infection with briefs.     Asked patient if we could get her a brief, could with try the purewick with it to measure urine.   Patient refused to wear purewick without brief.

## 2020-07-23 NOTE — Progress Notes (Signed)
Patient ID: Felicia [name], female   DOB: 11/02/1928, 86 y.o.   MRN: [id number] [facility] PROGRESS NOTE  Felicia [name] [facility] JJK:[id number] DOB: 1928/09/29 DOA: 07/18/2020 PCP: Sofie Hartigan, MD  HPI/Subjective: Patient feels a little bit tired after walking around but did better today with physical therapy.  Physical therapist changed a recommendation to home with home health.  Patient did have her oxygen saturations dropped down to 87% with ambulating on room air.  Objective: Vitals:   07/23/20 0818 07/23/20 1204  BP: 117/63 116/77  Pulse: 91 80  Resp: 18 18  Temp: 99.2 F (37.3 C) 98.4 F (36.9 C)  SpO2: 92% 92%    Intake/Output Summary (Last 24 hours) at 07/23/2020 1347 Last data filed at 07/23/2020 3716 Gross per 24 hour  Intake 603 ml  Output 0 ml  Net 603 ml   Filed Weights   07/21/20 0431 07/22/20 0459 07/23/20 0454  Weight: 87.6 kg 89.5 kg 86.7 kg    ROS: Review of Systems  Respiratory: Positive for cough and shortness of breath.   Cardiovascular: Negative for chest pain.  Gastrointestinal: Negative for abdominal pain, nausea and vomiting.   Exam: Physical Exam HENT:     Head: Normocephalic.     Mouth/Throat:     Pharynx: No oropharyngeal exudate.  Eyes:     General: Lids are normal.     Conjunctiva/sclera: Conjunctivae normal.     Pupils: Pupils are equal, round, and reactive to light.  Cardiovascular:     Rate and Rhythm: Normal rate. Rhythm irregularly irregular.     Heart sounds: Normal heart sounds, S1 normal and S2 normal.  Pulmonary:     Breath sounds: Examination of the right-lower field reveals decreased breath sounds and rhonchi. Examination of the left-lower field reveals decreased breath sounds and rhonchi. Decreased breath sounds and rhonchi present. No wheezing or rales.  Abdominal:     Palpations: Abdomen is soft.     Tenderness: There is no abdominal tenderness.  Musculoskeletal:     Right lower leg: No  swelling.     Left lower leg: No swelling.  Skin:    General: Skin is warm.     Findings: No rash.  Neurological:     Mental Status: She is alert and oriented to person, place, and time.       Data Reviewed: Basic Metabolic Panel: Recent Labs  Lab 07/18/20 1228 07/19/20 0854 07/20/20 0111 07/21/20 0604 07/22/20 0547  NA 136 137 137 138 140  K 4.8 4.2 4.1 4.1 3.7  CL 103 100 101 103 103  CO2 24 26 25 25 27   GLUCOSE 118* 93 94 107* 92  BUN 44* 40* 40* 39* 41*  CREATININE 1.96* 2.07* 2.18* 2.11* 2.15*  CALCIUM 8.6* 9.0 8.7* 8.7* 9.1  MG 1.9  --   --   --   --    Liver Function Tests: Recent Labs  Lab 07/18/20 1228 07/19/20 0854 07/20/20 0111 07/21/20 0604 07/22/20 0547  AST 13* 12* 15 15 18   ALT 7 7 6 7 7   ALKPHOS 71 63 62 62 61  BILITOT 1.1 1.0 1.1 0.9 0.8  PROT 7.3 6.8 6.4* 6.7 6.8  ALBUMIN 3.9 3.8 3.4* 3.6 3.8   CBC: Recent Labs  Lab 07/18/20 1228 07/19/20 0854 07/20/20 0111 07/21/20 0604 07/22/20 0547  WBC 6.4 5.6 5.5 5.5 6.2  NEUTROABS 3.5 2.8 2.4 2.6 2.6  HGB 8.2* 8.3* 7.6* 8.0* 7.9*  HCT 27.1* 26.9* 24.2* 25.4* 25.1*  MCV 97.1 96.1 94.9 95.5 94.0  PLT 142* 139* 122* 129* 124*   BNP (last 3 results) Recent Labs    07/18/20 1228  BNP 651.0*     Recent Results (from the past 240 hour(s))  Respiratory Panel by RT PCR (Flu A&B, Covid) - Nasopharyngeal Swab     Status: None   Collection Time: 07/18/20 12:28 PM   Specimen: Nasopharyngeal Swab  Result Value Ref Range Status   SARS Coronavirus 2 by RT PCR NEGATIVE NEGATIVE Final    Comment: (NOTE) SARS-CoV-2 target nucleic acids are NOT DETECTED.  The SARS-CoV-2 RNA is generally detectable in upper respiratoy specimens during the acute phase of infection. The lowest concentration of SARS-CoV-2 viral copies this assay can detect is 131 copies/mL. A negative result does not preclude SARS-Cov-2 infection and should not be used as the sole basis for treatment or other patient management  decisions. A negative result may occur with  improper specimen collection/handling, submission of specimen other than nasopharyngeal swab, presence of viral mutation(s) within the areas targeted by this assay, and inadequate number of viral copies (<131 copies/mL). A negative result must be combined with clinical observations, patient history, and epidemiological information. The expected result is Negative.  Fact Sheet for Patients:  PinkCheek.be  Fact Sheet for Healthcare Providers:  GravelBags.it  This test is no t yet approved or cleared by the [place] FDA and  has been authorized for detection and/or diagnosis of SARS-CoV-2 by FDA under an Emergency Use Authorization (EUA). This EUA will remain  in effect (meaning this test can be used) for the duration of the COVID-19 declaration under Section 564(b)(1) of the Act, 21 U.S.C. section 360bbb-3(b)(1), unless the authorization is terminated or revoked sooner.     Influenza A by PCR NEGATIVE NEGATIVE Final   Influenza B by PCR NEGATIVE NEGATIVE Final    Comment: (NOTE) The Xpert Xpress SARS-CoV-2/FLU/RSV assay is intended as an aid in  the diagnosis of influenza from Nasopharyngeal swab specimens and  should not be used as a sole basis for treatment. Nasal washings and  aspirates are unacceptable for Xpert Xpress SARS-CoV-2/FLU/RSV  testing.  Fact Sheet for Patients: PinkCheek.be  Fact Sheet for Healthcare Providers: GravelBags.it  This test is not yet approved or cleared by the [place] FDA and  has been authorized for detection and/or diagnosis of SARS-CoV-2 by  FDA under an Emergency Use Authorization (EUA). This EUA will remain  in effect (meaning this test can be used) for the duration of the  Covid-19 declaration under Section 564(b)(1) of the Act, 21  U.S.C. section 360bbb-3(b)(1), unless the  authorization is  terminated or revoked. Performed at [facility], [address] [place], [place] [address]       Scheduled Meds: . carvedilol  6.25 mg Oral BID WC  . diltiazem  180 mg Oral Daily  . enoxaparin (LOVENOX) injection  30 mg Subcutaneous Q24H  . furosemide  20 mg Intravenous Once  . mouth rinse  15 mL Mouth Rinse BID  . pantoprazole  40 mg Oral Daily  . sodium chloride flush  3 mL Intravenous Q12H  . sodium chloride flush  3 mL Intravenous Q12H   Continuous Infusions: . sodium chloride      Assessment/Plan:  1. Acute hypoxic respiratory failure.  This possibly could be chronic respiratory failure.  With ambulation today pulse ox went down to 87%.  Will check room air pulse ox with ambulation tomorrow morning to see if qualifies for  home oxygen. 2. Acute on chronic diastolic congestive heart failure.  We will give a dose of IV Lasix today.  Reassess things tomorrow. 3. Permanent atrial fibrillation.  No anticoagulation with anemia.  Continue Coreg and Cardizem CD 180 mg daily for rate control. 4. Acquired thrombophilia.  Stroke risk is higher being off anticoagulation with atrial fibrillation. 5. Anemia of chronic disease.  Follow-up with Dr. Mike Gip as outpatient.  Received Procrit on 07/10/2020.  Previous physician spoke with Dr. Marius Ditch to follow-up as outpatient on 11/18 6. Chronic kidney disease stage IV.  Recheck creatinine tomorrow morning. 7. Essential hypertension on Coreg and Cardizem CD. 8. Weakness.  Physical therapy recommended initially rehab but now changed their recommendation to home with home health.     Code Status:     Code Status Orders  (From admission, onward)         Start     Ordered   07/18/20 1354  Full code  Continuous        07/18/20 1357        Code Status History    Date Active Date Inactive Code Status Order ID Comments User Context   09/24/2016 0324 09/27/2016 1854 Full Code [id number]  Saundra Shelling, MD ED    09/12/2016 1728 09/16/2016 1524 Full Code [id number]  Theodoro Grist, MD ED   Advance Care Planning Activity     Family Communication: Spoke with son on the phone Disposition Plan: Status is: Inpatient  Dispo: The patient is from: Home              Anticipated d/c is to: Home with home health              Anticipated d/c date is: Potential 07/24/2020              Patient currently being given a dose of IV Lasix today for acute diastolic congestive heart failure.  Patient likely will qualify for home oxygen also.  Consultants:  Cardiology  Time spent: 28 minutes  [place]

## 2020-07-23 NOTE — Progress Notes (Signed)
[facility] Encounter Note  Patient: Shariece Viveiros [facility] / Admit Date: 07/18/2020 / Date of Encounter: 07/23/2020, 8:25 AM   Subjective: 11/20 the patient has slept very well overnight with no current evidence of significant new cardiovascular symptoms.  There is no evidence of cough congestion or anginal symptoms.  The patient has had much better heart rate and blood pressure control with current medical regimen changes including increased diltiazem with carvedilol dose.  The patient continues to have a low hemoglobin of 8 and glomerular filtration rate of 22.  11/21 patient has felt much better at this time and is sitting up in the bed breathing normally.  There has been no evidence of chest pain weakness fatigue or shortness of breath.  There is no apparent cough or congestion.  The patient does have a hemoglobin of 7.9 now stable.  Overall echocardiogram as listed below and unchanged from before.  Glomerular filtration rate is stable at 21.  Telemetry has been stable with control of heart rate at 91 bpm at rest.  Echocardiogram shows normal LV systolic function with ejection fraction of 60% no evidence of significant valvular heart disease contributing to below  Review of Systems: Positive for: None Negative for: Vision change, hearing change, syncope, dizziness, nausea, vomiting,diarrhea, bloody stool, stomach pain, cough, congestion, diaphoresis, urinary frequency, urinary pain,skin lesions, skin rashes Others previously listed  Objective: Telemetry: Atrial fibrillation with controlled ventricular rate Physical Exam: Blood pressure 117/63, pulse 91, temperature 99.2 F (37.3 C), temperature source Oral, resp. rate 18, height 5\' 8"  (1.727 m), weight 86.7 kg, SpO2 92 %. Body mass index is 29.06 kg/m. General: Well developed, well nourished, in no acute distress. Head: Normocephalic, atraumatic, sclera non-icteric, no xanthomas, nares are without  discharge. Neck: No apparent masses Lungs: Normal respirations with few wheezes, no rhonchi, no rales , no crackles   Heart: Irregular rate and rhythm, normal S1 S2, no murmur, no rub, no gallop, PMI is normal size and placement, carotid upstroke normal without bruit, jugular venous pressure normal Abdomen: Soft, non-tender, non-distended with normoactive bowel sounds. No hepatosplenomegaly. Abdominal aorta is normal size without bruit Extremities: Trace edema, no clubbing, no cyanosis, no ulcers,  Peripheral: 2+ radial, 2+ femoral, 2+ dorsal pedal pulses Neuro: Alert and oriented. Moves all extremities spontaneously. Psych:  Responds to questions appropriately with a normal affect.   Intake/Output Summary (Last 24 hours) at 07/23/2020 0825 Last data filed at 07/23/2020 6144 Gross per 24 hour  Intake 603 ml  Output 0 ml  Net 603 ml    Inpatient Medications:  . carvedilol  6.25 mg Oral BID WC  . diltiazem  180 mg Oral Daily  . enoxaparin (LOVENOX) injection  30 mg Subcutaneous Q24H  . mouth rinse  15 mL Mouth Rinse BID  . pantoprazole  40 mg Oral Daily  . sodium chloride flush  3 mL Intravenous Q12H  . sodium chloride flush  3 mL Intravenous Q12H   Infusions:  . sodium chloride      Labs: Recent Labs    07/21/20 0604 07/22/20 0547  NA 138 140  K 4.1 3.7  CL 103 103  CO2 25 27  GLUCOSE 107* 92  BUN 39* 41*  CREATININE 2.11* 2.15*  CALCIUM 8.7* 9.1   Recent Labs    07/21/20 0604 07/22/20 0547  AST 15 18  ALT 7 7  ALKPHOS 62 61  BILITOT 0.9 0.8  PROT 6.7 6.8  ALBUMIN 3.6 3.8   Recent Labs  07/21/20 0604 07/22/20 0547  WBC 5.5 6.2  NEUTROABS 2.6 2.6  HGB 8.0* 7.9*  HCT 25.4* 25.1*  MCV 95.5 94.0  PLT 129* 124*   No results for input(s): CKTOTAL, CKMB, TROPONINI in the last 72 hours. Invalid input(s): POCBNP No results for input(s): HGBA1C in the last 72 hours.   Weights: Filed Weights   07/21/20 0431 07/22/20 0459 07/23/20 0454  Weight: 87.6 kg  89.5 kg 86.7 kg     Radiology/Studies:  DG Chest 1 View  Result Date: 07/18/2020 CLINICAL DATA:  86 year old female with shortness of breath EXAM: CHEST  1 VIEW COMPARISON:  09/24/2016 FINDINGS: Cardiomediastinal silhouette unchanged. Low lung volumes persist. Interlobular septal thickening. New blunting of the bilateral costophrenic angles with partial obscuration of the hemidiaphragms. No pneumothorax. Persisting coarsened interstitial opacities. IMPRESSION: Acute CHF with small pleural effusions, with background of chronic lung changes Electronically Signed   By: Corrie Mckusick D.O.   On: 07/18/2020 13:06   DG Chest 2 View  Result Date: 07/21/2020 CLINICAL DATA:  Pleural effusion.  History of respiratory failure. EXAM: CHEST - 2 VIEW COMPARISON:  CT chest 07/18/2020.  Chest x-ray 07/18/2020. FINDINGS: Stable cardiomegaly. Interim partial resolution of bilateral interstitial prominence suggesting resolving interstitial edema. Bibasilar subsegmental atelectasis. Stable small bilateral pleural effusions. No pneumothorax no acute bony abnormality. IMPRESSION: 1. Stable cardiomegaly. Interim partial resolution of bilateral interstitial prominence suggesting resolving interstitial edema. Stable small bilateral pleural effusions. 2.  Bibasilar subsegmental atelectasis. Electronically Signed   By: Marcello Moores  Register   On: 07/21/2020 07:04   CT CHEST WO CONTRAST  Result Date: 07/18/2020 CLINICAL DATA:  Respiratory failure, leg swelling EXAM: CT CHEST WITHOUT CONTRAST TECHNIQUE: Multidetector CT imaging of the chest was performed following the standard protocol without IV contrast. COMPARISON:  None. FINDINGS: Cardiovascular: There is extensive multi-vessel coronary artery calcification. Extensive calcification of the mitral valve annulus. Extensive senescent calcifications of the aortic valve leaflets. Mild global cardiomegaly. No pericardial effusion. The central pulmonary arteries are enlarged in keeping  with changes of pulmonary arterial hypertension. Moderate atherosclerotic calcification within the thoracic aorta. No aortic aneurysm. Mediastinum/Nodes: 15 mm right thyroid nodule noted. There is shotty mediastinal and paraesophageal lymphadenopathy without frank pathologic enlargement. These are nonspecific and may be reactive in nature. The esophagus is patulous, a finding that can be seen with gastroesophageal reflux or esophageal dysmotility. Lungs/Pleura: Moderate bilateral pleural effusions are present with compressive atelectasis of the lower lobes bilaterally. There is smooth interlobular septal thickening, best appreciated at the lung apices, compatible with trace pulmonary edema. No focal consolidation. No central obstructing lesion. No pneumothorax. Upper Abdomen: No acute abnormality. Musculoskeletal: No lytic or blastic bone lesions are seen. IMPRESSION: Trace pulmonary edema and moderate bilateral pleural effusions in keeping with mild cardiogenic failure. Extensive coronary artery calcification. Extensive calcification of the mitral valve annulus and aortic valve leaflets. This can be associated with valvular pathology and would be better assessed with echocardiography. Morphologic changes in keeping with pulmonary arterial hypertension. 15 mm right thyroid nodule. Recommend thyroid US (ref: J Am Coll Radiol. 2015 Feb;12(2): 143-50). Aortic Atherosclerosis (ICD10-I70.0). Electronically Signed   By: Fidela Salisbury MD   On: 07/18/2020 23:13   US Venous Img Lower Bilateral (DVT)  Result Date: 07/19/2020 CLINICAL DATA:  86 year old with lower extremity edema. EXAM: BILATERAL LOWER EXTREMITY VENOUS DOPPLER ULTRASOUND TECHNIQUE: Gray-scale sonography with graded compression, as well as color Doppler and duplex ultrasound were performed to evaluate the lower extremity deep venous systems from the level of the  common femoral vein and including the common femoral, femoral, profunda femoral, popliteal and  calf veins including the posterior tibial, peroneal and gastrocnemius veins when visible. The superficial great saphenous vein was also interrogated. Spectral Doppler was utilized to evaluate flow at rest and with distal augmentation maneuvers in the common femoral, femoral and popliteal veins. COMPARISON:  None. FINDINGS: RIGHT LOWER EXTREMITY Common Femoral Vein: No evidence of thrombus. Normal compressibility, respiratory phasicity and response to augmentation. Saphenofemoral Junction: No evidence of thrombus. Normal compressibility and flow on color Doppler imaging. Profunda Femoral Vein: No evidence of thrombus. Normal compressibility and flow on color Doppler imaging. Femoral Vein: No evidence of thrombus. Normal compressibility, respiratory phasicity and response to augmentation. Popliteal Vein: No evidence of thrombus. Normal compressibility, respiratory phasicity and response to augmentation. Calf Veins: No evidence of thrombus. Normal compressibility and flow on color Doppler imaging. Other Findings: Small fluid collection in the right popliteal fossa that measures 2.9 x 0.8 x 1.6 cm. There was a similar small fluid collection in this area on the prior examination. LEFT LOWER EXTREMITY Common Femoral Vein: No evidence of thrombus. Normal compressibility, respiratory phasicity and response to augmentation. Saphenofemoral Junction: No evidence of thrombus. Normal compressibility and flow on color Doppler imaging. Profunda Femoral Vein: No evidence of thrombus. Normal compressibility and flow on color Doppler imaging. Femoral Vein: No evidence of thrombus. Normal compressibility, respiratory phasicity and response to augmentation. Popliteal Vein: No evidence of thrombus. Normal compressibility, respiratory phasicity and response to augmentation. Calf Veins: No evidence of thrombus. Normal compressibility and flow on color Doppler imaging. Other Findings: Again noted is a complex fluid collection in the left  popliteal fossa that measures 3.9 x 1.9 x 2.3 cm. Previously this collection measured 5.7 x 3.2 x 3.6 cm. IMPRESSION: No evidence of deep venous thrombosis in either lower extremity. Bilateral Baker's cysts, left side greater than right. The left knee Baker's cyst has decreased in size since August 2021. Electronically Signed   By: Markus Daft M.D.   On: 07/19/2020 14:39   ECHOCARDIOGRAM COMPLETE  Result Date: 07/19/2020    ECHOCARDIOGRAM REPORT   Patient Name:   DASHAWNA DELBRIDGE [facility] Date of Exam: 07/18/2020 Medical Rec #:  [id number]               Height:       68.0 in Accession #:    [id number]              Weight:       200.0 lb Date of Birth:  1928-12-30               BSA:          2.044 m Patient Age:    86 years                BP:           107/79 mmHg Patient Gender: F                       HR:           94 bpm. Exam Location:  [facility] Procedure: 2D Echo, Cardiac Doppler and Color Doppler Indications:     S28.76 Acute Diastolic Heart Failure  History:         Patient has no prior history of Echocardiogram examinations.                  Risk Factors:Hypertension. Pneumonia.  Sonographer:  Wilford Sports Rodgers-Jones Referring Phys:  [id number] Clabe Seal Diagnosing Phys: Isaias Cowman MD IMPRESSIONS  1. Left ventricular ejection fraction, by estimation, is 55 to 60%. The left ventricle has normal function. The left ventricle has no regional wall motion abnormalities. There is mild left ventricular hypertrophy. Left ventricular diastolic parameters are indeterminate.  2. Right ventricular systolic function is normal. The right ventricular size is normal.  3. Left atrial size was moderately dilated.  4. Right atrial size was mildly dilated.  5. The mitral valve is normal in structure. Mild to moderate mitral valve regurgitation. No evidence of mitral stenosis.  6. Tricuspid valve regurgitation is moderate.  7. The aortic valve is normal in structure. Aortic valve regurgitation is not visualized. Moderate  aortic valve stenosis.  8. The inferior vena cava is normal in size with greater than 50% respiratory variability, suggesting right atrial pressure of 3 mmHg. FINDINGS  Left Ventricle: Left ventricular ejection fraction, by estimation, is 55 to 60%. The left ventricle has normal function. The left ventricle has no regional wall motion abnormalities. The left ventricular internal cavity size was normal in size. There is  mild left ventricular hypertrophy. Left ventricular diastolic parameters are indeterminate. Right Ventricle: The right ventricular size is normal. No increase in right ventricular wall thickness. Right ventricular systolic function is normal. Left Atrium: Left atrial size was moderately dilated. Right Atrium: Right atrial size was mildly dilated. Pericardium: There is no evidence of pericardial effusion. Mitral Valve: The mitral valve is normal in structure. Mild to moderate mitral valve regurgitation. No evidence of mitral valve stenosis. Tricuspid Valve: The tricuspid valve is normal in structure. Tricuspid valve regurgitation is moderate . No evidence of tricuspid stenosis. Aortic Valve: The aortic valve is normal in structure. Aortic valve regurgitation is not visualized. Moderate aortic stenosis is present. Aortic valve mean gradient measures 17.7 mmHg. Aortic valve peak gradient measures 28.4 mmHg. Aortic valve area, by VTI measures 0.69 cm. Pulmonic Valve: The pulmonic valve was normal in structure. Pulmonic valve regurgitation is not visualized. No evidence of pulmonic stenosis. Aorta: The aortic root is normal in size and structure. Venous: The inferior vena cava is normal in size with greater than 50% respiratory variability, suggesting right atrial pressure of 3 mmHg. IAS/Shunts: No atrial level shunt detected by color flow Doppler.  LEFT VENTRICLE PLAX 2D LVIDd:         4.46 cm LVIDs:         3.07 cm LV PW:         1.17 cm LV IVS:        1.16 cm LVOT diam:     1.80 cm LV SV:         42  LV SV Index:   20 LVOT Area:     2.54 cm  RIGHT VENTRICLE            IVC RV Basal diam:  3.77 cm    IVC diam: 2.27 cm RV S prime:     9.09 cm/s TAPSE (M-mode): 1.4 cm LEFT ATRIUM              Index       RIGHT ATRIUM           Index LA diam:        6.80 cm  3.33 cm/m  RA Area:     23.20 cm LA Vol (A2C):   192.0 ml 93.95 ml/m RA Volume:   73.70 ml  36.06 ml/m LA Vol (A4C):  144.0 ml 70.46 ml/m LA Biplane Vol: 170.0 ml 83.18 ml/m  AORTIC VALVE AV Area (Vmax):    0.74 cm AV Area (Vmean):   0.77 cm AV Area (VTI):     0.69 cm AV Vmax:           266.67 cm/s AV Vmean:          200.000 cm/s AV VTI:            0.605 m AV Peak Grad:      28.4 mmHg AV Mean Grad:      17.7 mmHg LVOT Vmax:         78.00 cm/s LVOT Vmean:        60.900 cm/s LVOT VTI:          0.163 m LVOT/AV VTI ratio: 0.27  AORTA Ao Root diam: 2.90 cm MV E velocity: 166.75 cm/s  TRICUSPID VALVE                             TR Peak grad:   34.1 mmHg                             TR Vmax:        292.00 cm/s                              SHUNTS                             Systemic VTI:  0.16 m                             Systemic Diam: 1.80 cm Isaias Cowman MD Electronically signed by Isaias Cowman MD Signature Date/Time: 07/19/2020/1:14:49 PM    Final      Assessment and Recommendation  86 y.o. female with known hypertension hyperlipidemia chronic kidney disease stage IV with acute anemia and new onset of atrial fibrillation with rapid ventricular rate now more controlled on appropriate medication management without evidence of myocardial infarction or congestive heart failure 1.  Continuation of current combination of medication management including diltiazem and carvedilol and will follow with ambulation to adjust dosage of diltiazem for heart rate control between 60 and 90 bpm at rest.  Will evaluate by ambulation this afternoon to see if patient needs adjustments but mainly will make more adjustments as an outpatient 2.  Continue to  avoid anticoagulation at this time due to concerns of severe anemia chronic kidney disease and possible causes of these at this time.  Will consider further addition of anticoagulation when able as an outpatient 3.  No further cardiac diagnostics necessary at this time due to no evidence of significant LV systolic dysfunction anginal symptoms or congestive heart failure 4.  Begin ambulation and follow for improvements of symptoms and possible discharge to home and or outside facility for rehabilitation when stable from the medical standpoint and/or pulmonary standpoint  Signed, Serafina Royals M.D. FACC

## 2020-07-23 NOTE — Progress Notes (Signed)
SATURATION QUALIFICATIONS: (This note is used to comply with regulatory documentation for home oxygen)  Patient Saturations on Room Air at Rest = 90%  Patient Saturations on Room Air while Ambulating = 87%  Patient Saturations on 2 Liters of oxygen while Ambulating = 92%  Please briefly explain why patient needs home oxygen: 

## 2020-07-23 NOTE — Progress Notes (Addendum)
Physical Therapy Treatment Patient Details Name: Felicia [name] MRN: [id number] DOB: [birth year] Today's Date: 07/23/2020    History of Present Illness Pt is an 86 yo female with PMH that includes HTN, CKD IV, anemia of CKD, and CML.  Pt presented with sob for about 3 weeks and found to be in A-fib.  MD assessment includes: acute respiratory failure with hypoxia, HTN, acute on chronic diastolic congestive heart failure, anemia of chronic disease, and A-fib.    PT Comments    Ready for session.  She is able to get up and walk 120' with RW with supervision/min guard.  She is on 1.5 LPM at rest.  2 LPM used for gait as gauge did not have 1.5 option.  She is stable with O2 sats 92% on 2lpm with mobility.  O2 removed and sats decreased 87% after 30'.  To commode to void and care.  Remained in recliner with MD in to check.  Discussed discharge plan.  Given improvement in mobility and pt voicing comfort with home, will update discharge recommendations.  She stated she lives alone but has support from family and neighbor.  Will need O2 at home at this point but she stated she has used it in the past.   Follow Up Recommendations  HHPT     Equipment Recommendations  None recommended by PT    Recommendations for Other Services       Precautions / Restrictions Precautions Precautions: Fall Restrictions Weight Bearing Restrictions: No Other Position/Activity Restrictions: Watch SpO2    Mobility  Bed Mobility Overal bed mobility: Modified Independent                Transfers Overall transfer level: Modified independent Equipment used: Rolling walker (2 wheeled) Transfers: Sit to/from Stand Sit to Stand: Modified independent (Device/Increase time)            Ambulation/Gait Ambulation/Gait assistance: Min guard Gait Distance (Feet): 120 Feet Assistive device: Rolling walker (2 wheeled) Gait Pattern/deviations: Step-through pattern;Decreased step length -  right;Decreased step length - left Gait velocity: decreased       Stairs             Wheelchair Mobility    Modified Rankin (Stroke Patients Only)       Balance Overall balance assessment: Needs assistance Sitting-balance support: Feet supported Sitting balance-Leahy Scale: Good     Standing balance support: Bilateral upper extremity supported;During functional activity Standing balance-Leahy Scale: Good                              Cognition Arousal/Alertness: Awake/alert Behavior During Therapy: WFL for tasks assessed/performed Overall Cognitive Status: Within Functional Limits for tasks assessed                                        Exercises      General Comments        Pertinent Vitals/Pain Pain Assessment: No/denies pain    Home Living                      Prior Function            PT Goals (current goals can now be found in the care plan section) Progress towards PT goals: Progressing toward goals    Frequency    Min 2X/week  PT Plan      Co-evaluation              AM-PAC PT "6 Clicks" Mobility   Outcome Measure  Help needed turning from your back to your side while in a flat bed without using bedrails?: None Help needed moving from lying on your back to sitting on the side of a flat bed without using bedrails?: None Help needed moving to and from a bed to a chair (including a wheelchair)?: None Help needed standing up from a chair using your arms (e.g., wheelchair or bedside chair)?: None Help needed to walk in hospital room?: A Little Help needed climbing 3-5 steps with a railing? : A Lot 6 Click Score: 21    End of Session Equipment Utilized During Treatment: Gait belt;Oxygen Activity Tolerance: Patient tolerated treatment well Patient left: in chair;with call bell/phone within reach;with chair alarm set Nurse Communication: Mobility status PT Visit Diagnosis: History of  falling (Z91.81);Muscle weakness (generalized) (M62.81);Difficulty in walking, not elsewhere classified (R26.2)     Time: 1205-1220 PT Time Calculation (min) (ACUTE ONLY): 15 min  Charges:  $Gait Training: 8-22 mins                    Chesley Noon, PTA 07/23/20, 12:50 PM

## 2020-07-24 ENCOUNTER — Telehealth: Payer: Self-pay

## 2020-07-24 ENCOUNTER — Inpatient Hospital Stay: Payer: Medicare Other

## 2020-07-24 DIAGNOSIS — I482 Chronic atrial fibrillation, unspecified: Secondary | ICD-10-CM

## 2020-07-24 DIAGNOSIS — J9621 Acute and chronic respiratory failure with hypoxia: Secondary | ICD-10-CM

## 2020-07-24 LAB — BASIC METABOLIC PANEL
Anion gap: 10 (ref 5–15)
BUN: 38 mg/dL — ABNORMAL HIGH (ref 8–23)
CO2: 28 mmol/L (ref 22–32)
Calcium: 8.8 mg/dL — ABNORMAL LOW (ref 8.9–10.3)
Chloride: 102 mmol/L (ref 98–111)
Creatinine, Ser: 1.82 mg/dL — ABNORMAL HIGH (ref 0.44–1.00)
GFR, Estimated: 26 mL/min — ABNORMAL LOW (ref >60)
Glucose, Bld: 94 mg/dL (ref 70–99)
Potassium: 3.9 mmol/L (ref 3.5–5.1)
Sodium: 140 mmol/L (ref 135–145)

## 2020-07-24 LAB — CBC
HCT: 25.9 % — ABNORMAL LOW (ref 36.0–46.0)
Hemoglobin: 8.2 g/dL — ABNORMAL LOW (ref 12.0–15.0)
MCH: 30 pg (ref 26.0–34.0)
MCHC: 31.7 g/dL (ref 30.0–36.0)
MCV: 94.9 fL (ref 80.0–100.0)
Platelets: 122 10*3/uL — ABNORMAL LOW (ref 150–400)
RBC: 2.73 MIL/uL — ABNORMAL LOW (ref 3.87–5.11)
RDW: 16.5 % — ABNORMAL HIGH (ref 11.5–15.5)
WBC: 5 10*3/uL (ref 4.0–10.5)
nRBC: 0 % (ref 0.0–0.2)

## 2020-07-24 MED ORDER — FUROSEMIDE 10 MG/ML IJ SOLN
40.0000 mg | Freq: Once | INTRAMUSCULAR | Status: AC
  Administered 2020-07-24: 40 mg via INTRAVENOUS
  Filled 2020-07-24: qty 4

## 2020-07-24 MED ORDER — FUROSEMIDE 20 MG PO TABS: 20.0000 mg | ORAL_TABLET | Freq: Two times a day (BID) | ORAL | 0 refills | Status: DC

## 2020-07-24 MED ORDER — FUROSEMIDE 40 MG PO TABS
40.0000 mg | ORAL_TABLET | Freq: Every day | ORAL | Status: DC
  Filled 2020-07-24: qty 1

## 2020-07-24 MED ORDER — ALBUTEROL SULFATE HFA 108 (90 BASE) MCG/ACT IN AERS: 2.0000 | INHALATION_SPRAY | Freq: Four times a day (QID) | RESPIRATORY_TRACT | 0 refills | Status: AC | PRN

## 2020-07-24 MED ORDER — DILTIAZEM HCL ER COATED BEADS 180 MG PO CP24
180.0000 mg | ORAL_CAPSULE | Freq: Every day | ORAL | 0 refills | Status: DC
Start: 2020-07-25 — End: 2020-08-01

## 2020-07-24 NOTE — Plan of Care (Signed)
  Problem: Education: Goal: Knowledge of General Education information will improve Description: Including pain rating scale, medication(s)/side effects and non-pharmacologic comfort measures Outcome: Progressing   Problem: Health Behavior/Discharge Planning: Goal: Ability to manage health-related needs will improve Outcome: Progressing   Problem: Clinical Measurements: Goal: Ability to maintain clinical measurements within normal limits will improve Outcome: Progressing Goal: Will remain free from infection Outcome: Progressing Goal: Diagnostic test results will improve Outcome: Progressing Goal: Respiratory complications will improve Outcome: Progressing Goal: Cardiovascular complication will be avoided Outcome: Progressing   Problem: Activity: Goal: Risk for activity intolerance will decrease Outcome: Progressing   Problem: Nutrition: Goal: Adequate nutrition will be maintained Outcome: Progressing   Problem: Coping: Goal: Level of anxiety will decrease Outcome: Progressing   Problem: Elimination: Goal: Will not experience complications related to bowel motility Outcome: Progressing Goal: Will not experience complications related to urinary retention Outcome: Progressing   Problem: Pain Managment: Goal: General experience of comfort will improve Outcome: Progressing   Problem: Safety: Goal: Ability to remain free from injury will improve Outcome: Progressing   Problem: Skin Integrity: Goal: Risk for impaired skin integrity will decrease Outcome: Progressing   Problem: Education: Goal: Ability to demonstrate management of disease process will improve Outcome: Progressing Goal: Ability to verbalize understanding of medication therapies will improve Outcome: Progressing Goal: Individualized Educational Video(s) Outcome: Progressing   Problem: Activity: Goal: Capacity to carry out activities will improve Outcome: Progressing   Problem: Cardiac: Goal:  Ability to achieve and maintain adequate cardiopulmonary perfusion will improve Outcome: Progressing   Problem: Education: Goal: Ability to demonstrate management of disease process will improve Outcome: Progressing Goal: Ability to verbalize understanding of medication therapies will improve Outcome: Progressing Goal: Individualized Educational Video(s) Outcome: Progressing   Problem: Activity: Goal: Capacity to carry out activities will improve Outcome: Progressing

## 2020-07-24 NOTE — Progress Notes (Signed)
Mobility Specialist - Progress Note   07/24/20 1210  Mobility  Activity Ambulated in room;Ambulated in hall  Level of Assistance Standby assist, set-up cues, supervision of patient - no hands on (CGA for safety)  Assistive Device Front wheel walker  Distance Ambulated (ft) 120 ft  Mobility Response Tolerated well  Mobility performed by Mobility specialist  $Mobility charge 1 Mobility    Pre-mobility: 86 HR, 90% SpO2 During mobility: 73 HR, 88% SpO2 Post-mobility: 100 HR, 89% SpO2   Pt sitting on recliner on RA upon arrival. Pt agreed to session. Pt S2S to RW SBA. VC required for hand placement. Pt ambulated 120' total in room and hallway using a RW SBA. Noted SOB. O2 desat to 88% during ambulation. (Below are O2 sats of pt on RA during session.) Pt utilized PLB t/o session to manage SOB. No LOB noted. Returning to recliner, pt failed to follow VC for hand placement for stand-to-sit, resulting in pt quickly sitting onto chair w/o control. Pt educated on the importance of proper hand placement during transition from sit<>stand for safety. Overall, pt tolerated session well. Pt left sitting on recliner w/ all needs placed in reach. Nurse was notified.    SaO2 on room air at rest = 90% SaO2 on room air while ambulating = 88%    Verenice Westrich Mobility Specialist  07/24/20, 12:19 PM

## 2020-07-24 NOTE — Telephone Encounter (Signed)
Made appointment for patient on 08/10/2020 at 1:45pm

## 2020-07-24 NOTE — Care Management Important Message (Signed)
Important Message  Patient Details  Name: Felicia [name] MRN: [id number] Date of Birth: 1929/03/07   Medicare Important Message Given:  Yes     Dannette Barbara 07/24/2020, 12:25 PM

## 2020-07-24 NOTE — Plan of Care (Signed)
  Problem: Education: Goal: Knowledge of General Education information will improve Description: Including pain rating scale, medication(s)/side effects and non-pharmacologic comfort measures Outcome: Adequate for Discharge   Problem: Health Behavior/Discharge Planning: Goal: Ability to manage health-related needs will improve Outcome: Adequate for Discharge   Problem: Clinical Measurements: Goal: Ability to maintain clinical measurements within normal limits will improve Outcome: Adequate for Discharge Goal: Will remain free from infection Outcome: Adequate for Discharge Goal: Diagnostic test results will improve Outcome: Adequate for Discharge Goal: Respiratory complications will improve Outcome: Adequate for Discharge Goal: Cardiovascular complication will be avoided Outcome: Adequate for Discharge   Problem: Activity: Goal: Risk for activity intolerance will decrease Outcome: Adequate for Discharge   Problem: Nutrition: Goal: Adequate nutrition will be maintained Outcome: Adequate for Discharge   Problem: Coping: Goal: Level of anxiety will decrease Outcome: Adequate for Discharge   Problem: Elimination: Goal: Will not experience complications related to bowel motility Outcome: Adequate for Discharge Goal: Will not experience complications related to urinary retention Outcome: Adequate for Discharge   Problem: Pain Managment: Goal: General experience of comfort will improve Outcome: Adequate for Discharge   Problem: Safety: Goal: Ability to remain free from injury will improve Outcome: Adequate for Discharge   Problem: Skin Integrity: Goal: Risk for impaired skin integrity will decrease Outcome: Adequate for Discharge   Problem: Education: Goal: Ability to demonstrate management of disease process will improve Outcome: Adequate for Discharge Goal: Ability to verbalize understanding of medication therapies will improve Outcome: Adequate for Discharge Goal:  Individualized Educational Video(s) Outcome: Adequate for Discharge   Problem: Activity: Goal: Capacity to carry out activities will improve Outcome: Adequate for Discharge   Problem: Cardiac: Goal: Ability to achieve and maintain adequate cardiopulmonary perfusion will improve Outcome: Adequate for Discharge   Problem: Education: Goal: Ability to demonstrate management of disease process will improve Outcome: Adequate for Discharge Goal: Ability to verbalize understanding of medication therapies will improve Outcome: Adequate for Discharge Goal: Individualized Educational Video(s) Outcome: Adequate for Discharge   Problem: Activity: Goal: Capacity to carry out activities will improve Outcome: Adequate for Discharge

## 2020-07-24 NOTE — Discharge Instructions (Signed)
Atrial Fibrillation  Atrial fibrillation is a type of heartbeat that is irregular or fast. If you have this condition, your heart beats without any order. This makes it hard for your heart to pump blood in a normal way. Atrial fibrillation may come and go, or it may become a long-lasting problem. If this condition is not treated, it can put you at higher risk for stroke, heart failure, and other heart problems. What are the causes? This condition may be caused by diseases that damage the heart. They include:  High blood pressure.  Heart failure.  Heart valve disease.  Heart surgery. Other causes include:  Diabetes.  Thyroid disease.  Being overweight.  Kidney disease. Sometimes the cause is not known. What increases the risk? You are more likely to develop this condition if:  You are older.  You smoke.  You exercise often and very hard.  You have a family history of this condition.  You are a man.  You use drugs.  You drink a lot of alcohol.  You have lung conditions, such as emphysema, pneumonia, or COPD.  You have sleep apnea. What are the signs or symptoms? Common symptoms of this condition include:  A feeling that your heart is beating very fast.  Chest pain or discomfort.  Feeling short of breath.  Suddenly feeling light-headed or weak.  Getting tired easily during activity.  Fainting.  Sweating. In some cases, there are no symptoms. How is this treated? Treatment for this condition depends on underlying conditions and how you feel when you have atrial fibrillation. They include:  Medicines to: ? Prevent blood clots. ? Treat heart rate or heart rhythm problems.  Using devices, such as a pacemaker, to correct heart rhythm problems.  Doing surgery to remove the part of the heart that sends bad signals.  Closing an area where clots can form in the heart (left atrial appendage). In some cases, your doctor will treat other underlying  conditions. Follow these instructions at home: Medicines  Take over-the-counter and prescription medicines only as told by your doctor.  Do not take any new medicines without first talking to your doctor.  If you are taking blood thinners: ? Talk with your doctor before you take any medicines that have aspirin or NSAIDs, such as ibuprofen, in them. ? Take your medicine exactly as told by your doctor. Take it at the same time each day. ? Avoid activities that could hurt or bruise you. Follow instructions about how to prevent falls. ? Wear a bracelet that says you are taking blood thinners. Or, carry a card that lists what medicines you take. Lifestyle      Do not use any products that have nicotine or tobacco in them. These include cigarettes, e-cigarettes, and chewing tobacco. If you need help quitting, ask your doctor.  Eat heart-healthy foods. Talk with your doctor about the right eating plan for you.  Exercise regularly as told by your doctor.  Do not drink alcohol.  Lose weight if you are overweight.  Do not use drugs, including cannabis. General instructions  If you have a condition that causes breathing to stop for a short period of time (apnea), treat it as told by your doctor.  Keep a healthy weight. Do not use diet pills unless your doctor says they are safe for you. Diet pills may make heart problems worse.  Keep all follow-up visits as told by your doctor. This is important. Contact a doctor if:  You notice a change   in the speed, rhythm, or strength of your heartbeat.  You are taking a blood-thinning medicine and you get more bruising.  You get tired more easily when you move or exercise.  You have a sudden change in weight. Get help right away if:   You have pain in your chest or your belly (abdomen).  You have trouble breathing.  You have side effects of blood thinners, such as blood in your vomit, poop (stool), or pee (urine), or bleeding that cannot  stop.  You have any signs of a stroke. "BE FAST" is an easy way to remember the main warning signs: ? B - Balance. Signs are dizziness, sudden trouble walking, or loss of balance. ? E - Eyes. Signs are trouble seeing or a change in how you see. ? F - Face. Signs are sudden weakness or loss of feeling in the face, or the face or eyelid drooping on one side. ? A - Arms. Signs are weakness or loss of feeling in an arm. This happens suddenly and usually on one side of the body. ? S - Speech. Signs are sudden trouble speaking, slurred speech, or trouble understanding what people say. ? T - Time. Time to call emergency services. Write down what time symptoms started.  You have other signs of a stroke, such as: ? A sudden, very bad headache with no known cause. ? Feeling like you may vomit (nausea). ? Vomiting. ? A seizure. These symptoms may be an emergency. Do not wait to see if the symptoms will go away. Get medical help right away. Call your local emergency services (911 in the [place] Do not drive yourself to the hospital. Summary  Atrial fibrillation is a type of heartbeat that is irregular or fast.  You are at higher risk of this condition if you smoke, are older, have diabetes, or are overweight.  Follow your doctor's instructions about medicines, diet, exercise, and follow-up visits.  Get help right away if you have signs or symptoms of a stroke.  Get help right away if you cannot catch your breath, or you have chest pain or discomfort. This information is not intended to replace advice given to you by your health care provider. Make sure you discuss any questions you have with your health care provider. Document Revised: 02/10/2019 Document Reviewed: 02/10/2019 Elsevier Patient Education  2020 [place].   Heart Failure Exacerbation  Heart failure is a condition in which the heart does not fill up with enough blood, and therefore does not pump enough blood and oxygen to the  body. When this happens, parts of the body do not get the blood and oxygen they need to function properly. This can cause symptoms such as breathing problems, fatigue, swelling, and confusion. Heart failure exacerbation refers to heart failure symptoms that get worse. The symptoms may get worse suddenly or develop slowly over time. Heart failure exacerbation is a serious medical problem that should be treated right away. What are the causes? A heart failure exacerbation can be triggered by:  Not taking your heart failure medicines correctly.  Infections.  Eating an unhealthy diet or a diet that is high in salt (sodium).  Drinking too much fluid.  Drinking alcohol.  Taking illegal drugs, such as cocaine or methamphetamine.  Not exercising. Other causes include:  Other heart conditions such as an irregular heartbeat (arrhythmia).  Anemia.  Other medical problems, such as kidney failure. Sometimes the cause of the exacerbation is not known. What are the signs  or symptoms? When heart failure symptoms suddenly or slowly get worse, this may be a sign of heart failure exacerbation. Symptoms of heart failure include:  Breathing problems or shortness of breath.  Chronic coughing or wheezing.  Fatigue.  Nausea or lack of appetite.  Feeling light-headed.  Confusion or memory loss.  Increased heart rate or irregular heartbeat.  Buildup of fluid in the legs, ankles, feet, or abdomen.  Difficulty breathing when lying down. How is this diagnosed? This condition is diagnosed based on:  Your symptoms and medical history.  A physical exam. You may also have tests, including:  Electrocardiogram (ECG). This test measures the electrical activity of your heart.  Echocardiogram. This test uses sound waves to take a picture of your heart to see how well it works.  Blood tests.  Imaging tests, such as: ? Chest X-ray. ? MRI. ? Ultrasound.  Stress test. This test examines how  well your heart functions when you exercise. Your heart is monitored while you exercise on a treadmill or exercise bike. If you cannot exercise, medicines may be used to increase your heartbeat in place of exercise.  Cardiac catheterization. During this test, a thin, flexible tube (catheter) is inserted into a blood vessel and threaded up to your heart. This test allows your health care provider to check the arteries that lead to your heart (coronary arteries).  Right heart catheterization. During this test, the pressure in your heart is measured. How is this treated? This condition may be treated by:  Adjusting your heart medicines.  Maintaining a healthy lifestyle. This includes: ? Eating a heart-healthy diet that is low in sodium. ? Not using any products that contain nicotine or tobacco, such as cigarettes and e-cigarettes. ? Regular exercise. ? Monitoring your fluid intake. ? Monitoring your weight and reporting changes to your health care provider.  Treating sleep apnea, if you have this condition.  Surgery. This may include: ? Implanting a device that helps both sides of your heart contract at the same time (cardiac resynchronization therapy device). This can help with heart function and relieve heart failure symptoms. ? Implanting a device that can correct heart rhythm problems (implantable cardioverter defibrillator). ? Connecting a device to your heart to help it pump blood (ventricular assist device). ? Heart transplant. Follow these instructions at home: Medicines  Take over-the-counter and prescription medicines only as told by your health care provider.  Do not stop taking your medicines or change the amount you take. If you are having problems or side effects from your medicines, talk to your health care provider.  If you are having difficulty paying for your medicines, contact a social worker or your clinic. There are many programs to assist with medicine costs.  Talk  to your health care provider before starting any new medicines or supplements.  Make sure your health care provider and pharmacist have a list of all the medicines you are taking. Eating and drinking   Avoid drinking alcohol.  Eat a heart-healthy diet as told by your health care provider. This includes: ? Plenty of fruits and vegetables. ? Lean proteins. ? Low-fat dairy. ? Whole grains. ? Foods that are low in sodium. Activity   Exercise regularly as told by your health care provider. Balance exercise with rest.  Ask your health care provider what activities are safe for you. This includes sexual activity, exercise, and daily tasks at home or work. Lifestyle  Do not use any products that contain nicotine or tobacco, such as  cigarettes and e-cigarettes. If you need help quitting, ask your health care provider.  Maintain a healthy weight. Ask your health care provider what weight is healthy for you.  Consider joining a patient support group. This can help with emotional problems you may have, such as stress and anxiety. General instructions  Talk to your health care provider about flu and pneumonia vaccines.  Keep a list of medicines that you are taking. This may help in emergency situations.  Keep all follow-up visits as told by your health care provider. This is important. Contact a health care provider if:  You have questions about your medicines or you miss a dose.  You feel anxious, depressed, or stressed.  You have swelling in your feet, ankles, legs, or abdomen.  You have shortness of breath during activity or exercise.  You have a cough.  You have a fever.  You have trouble sleeping.  You gain 2-3 lb (1-1.4 kg) in 24 hours or 5 lb (2.3 kg) in a week. Get help right away if:  You have chest pain.  You have shortness of breath while resting.  You have severe fatigue.  You are confused.  You have severe dizziness.  You have a rapid or irregular  heartbeat.  You have nausea or you vomit.  You have a cough that is worse at night or you cannot lie flat.  You have a cough that will not go away.  You have severe depression or sadness. Summary  When heart failure symptoms get worse, it is called heart failure exacerbation.  Common causes of this condition include taking medicines incorrectly, infections, and drinking alcohol.  This condition may be treated by adjusting medicines, maintaining a healthy lifestyle, or surgery.  Do not stop taking your medicines or change the amount you take. If you are having problems or side effects from your medicines, talk to your health care provider. This information is not intended to replace advice given to you by your health care provider. Make sure you discuss any questions you have with your health care provider. Document Revised: 08/01/2017 Document Reviewed: 12/31/2016 Elsevier Patient Education  [place].

## 2020-07-24 NOTE — TOC Transition Note (Signed)
Transition of Care [facility]) - CM/SW Discharge Note   Patient Details  Name: Felicia [name] MRN: [id number] Date of Birth: [birth year]  Transition of Care [facility]) CM/SW Contact:  Meriel Flavors, LCSW Phone Number: 07/24/2020, 1:04 PM   Clinical Narrative:    CSW was notified by attending patient is discharging today and will need HH/PT and Oxygen. CSW spoke with patient's son, Felicia [name] about DME and HH/PT preferences. Matt states they don't have a preference. CSW completed DME/oxygen referral to [facility], will deliver to room. CSW completed referral [facility] for HH/PT. [facility] will provide HH/PT patient. CSW spoke with Catalina Antigua to explain services and agencies that will contact them.    Final next level of care: Home/Self Care Barriers to Discharge: Continued Medical Work up   Patient Goals and CMS Choice Patient states their goals for this hospitalization and ongoing recovery are:: Return to Mobile Home      Discharge Placement                       Discharge Plan and Services     Post Acute Care Choice: Home Health (Pending PT evaluation, however patient is agreeable.)                               Social Determinants of Health (SDOH) Interventions     Readmission Risk Interventions No flowsheet data found.

## 2020-07-24 NOTE — Progress Notes (Addendum)
   HFpEF 55-60 %.  Normal right ventricular systolic function.  Moderate LAE.  Mild RAE.  Mild to moderatea MR.  Moderate TR.  Moderate AS.  Co morbidities:  Atrial fibrllation CKD stage IV Chronic anemia Hypertension Hyperlipidemia   Medications:  Coreg 6.25 mg BID Diltiazem 180 mg daily Lasix 20 mg IV one time today   Labs: Sodium 140, potassium 3.9, BUN 38, creatinine 1.82, GFR 26, hemoglobin 8.2(7.9), hematocrit 25.9.  Weight not documented today, yesterday 86.7  Intake 720 ml Output not documented BP 125/83, heart rate varies from 90 to 100 afib rhythm.  Reds clip vest on admission 35. Today recheck was 29.  CXR performed today reveals the effusions appear slightly larger with mild interstitial edema.  Assessment:  General- she is awake and alert sitting up in chair at bedside.  She states she still is SOB, does not feel like she can take a whole breath in, is using IS.  HEENT- No JVD, non icterus.  Cardiac- heart tones are irregular, + systolic murmur  Chest- breath sounds diminished in the bases.  Abdomen-rounded and soft.non tender  Musculoskeletal- no lower extremity edema.  Psych is pleasant and appropriate. Makes good eye contact.  Neuro-speech is clear, moves all exremities.   She states she feels that she is stronger.  Does not have any questions concerning heart failure today.  She will continue to weigh herself daily and record.  She will receive one dose of IV lasix 20 mg today, Red Clip vest was performed after that dose of approximately 3 1/2 hours, clip reading had improved from 11/17 reading was 35 now today's is 50.   Pricilla Riffle RN, CHFN

## 2020-07-24 NOTE — Discharge Summary (Signed)
[facility] [place] NAME: Renna Kilmer    MR#:  [id number]  DATE OF BIRTH:  Aug 04, 1929  DATE OF ADMISSION:  07/18/2020 ADMITTING PHYSICIAN: Para Skeans, MD  DATE OF DISCHARGE: 07/24/2020  4:19 PM  PRIMARY CARE PHYSICIAN: Sofie Hartigan, MD    ADMISSION DIAGNOSIS:  Edema [R60.9] Chronic anemia [D64.9] Respiratory failure with hypoxia ([place]) [J96.91] Acute respiratory failure with hypoxia (HCC) [J96.01] Acute heart failure, unspecified heart failure type ([place]) [I50.9] Atrial fibrillation, unspecified type ([place]) [I48.91] Chronic kidney disease, unspecified CKD stage [N18.9]  DISCHARGE DIAGNOSIS:  Principal Problem:   Respiratory failure with hypoxia ([place]) Active Problems:   Essential hypertension   CKD (chronic kidney disease), stage IV (HCC)   Anemia of chronic disease   Atrial fibrillation with RVR (HCC)   Acute on chronic diastolic CHF (congestive heart failure) (HCC)   Permanent atrial fibrillation (HCC)   Acquired thrombophilia ([place])   Weakness   SECONDARY DIAGNOSIS:   Past Medical History:  Diagnosis Date  . GERD (gastroesophageal reflux disease)   . HOH (hard of hearing)   . Hypertension   . Pneumonia   . Vertigo     HOSPITAL COURSE:   1.  Acute on chronic hypoxic respiratory failure.  The patient did have a pulse ox that dropped down to 87% with ambulation.  Patient qualifies for home oxygen.  Patient was set up for home oxygen upon going home today. 2.  Acute on chronic diastolic congestive heart failure.  The patient was given 1 dose of IV Lasix yesterday and again this morning.  Late morning, a ReDS vest reading was 29 (less than 35 is a good reading).  Lungs are clear upon disposition.  I did prescribe low-dose Lasix upon disposition.  Patient on Coreg. 3.  Permanent atrial fibrillation.  No anticoagulation with anemia.  Continue Coreg and Cardizem CD for rate control. 4.  Acquired thrombophilia.   Stroke risk higher being off anticoagulation with atrial fibrillation. 5.  Anemia of chronic disease.  Patient follows up with Dr. Mike Gip as outpatient.  She received Procrit on 07/10/2020.  Her previous physician rounding on this patient did speak with Dr. Marius Ditch to schedule follow-up appointment as outpatient. 6.  Chronic kidney disease stage IV.  With giving IV Lasix yesterday creatinine did improve to 1.82.  Can consider restarting ARB as outpatient if blood pressure remains stable.  I did get rid of hydrochlorothiazide because this medication does not work as well when we have chronic kidney disease stage IV. 7.  Essential hypertension on Coreg and Cardizem CD 8.  Weakness.  Physical therapy initially recommended rehab but patient did better yesterday with physical therapy and now recommending home with home health.  DISCHARGE CONDITIONS:   Satisfactory  CONSULTS OBTAINED:  Cardiology  DRUG ALLERGIES:  No Known Allergies  DISCHARGE MEDICATIONS:   Allergies as of 07/24/2020   No Known Allergies     Medication List    STOP taking these medications   amLODipine 10 MG tablet Commonly known as: NORVASC   cloNIDine 0.3 MG tablet Commonly known as: CATAPRES   telmisartan-hydrochlorothiazide 80-12.5 MG tablet Commonly known as: MICARDIS HCT     TAKE these medications   albuterol 108 (90 Base) MCG/ACT inhaler Commonly known as: VENTOLIN HFA Inhale 2 puffs into the lungs every 6 (six) hours as needed for wheezing or shortness of breath.   carvedilol 6.25 MG tablet Commonly known as: COREG Take 6.25 mg by mouth 2 (  two) times daily with a meal.   cyanocobalamin 1000 MCG tablet Take 1,000 mcg by mouth daily.   diltiazem 180 MG 24 hr capsule Commonly known as: CARDIZEM CD Take 1 capsule (180 mg total) by mouth daily. Start taking on: July 25, 2020   furosemide 20 MG tablet Commonly known as: Lasix Take 1 tablet (20 mg total) by mouth 2 (two) times daily.   Iron 325  (65 Fe) MG Tabs Take by mouth.   omeprazole 20 MG capsule Commonly known as: PRILOSEC Take 20 mg by mouth daily.            Durable Medical Equipment  (From admission, onward)         Start     Ordered   07/24/20 1224  For home use only DME oxygen  Once       Question Answer Comment  Length of Need Lifetime   Mode or (Route) Nasal cannula   Liters per Minute 2   Frequency Continuous (stationary and portable oxygen unit needed)   Oxygen conserving device Yes   Oxygen delivery system Gas      07/24/20 1224           DISCHARGE INSTRUCTIONS:   Follow-up PMD 5 days Follow-up cardiology 1 week Follow-up gastroenterology as outpatient Continue to follow-up with oncology  If you experience worsening of your admission symptoms, develop shortness of breath, life threatening emergency, suicidal or homicidal thoughts you must seek medical attention immediately by calling 911 or calling your MD immediately  if symptoms less severe.  You Must read complete instructions/literature along with all the possible adverse reactions/side effects for all the Medicines you take and that have been prescribed to you. Take any new Medicines after you have completely understood and accept all the possible adverse reactions/side effects.   Please note  You were cared for by a hospitalist during your hospital stay. If you have any questions about your discharge medications or the care you received while you were in the hospital after you are discharged, you can call the unit and asked to speak with the hospitalist on call if the hospitalist that took care of you is not available. Once you are discharged, your primary care physician will handle any further medical issues. Please note that NO REFILLS for any discharge medications will be authorized once you are discharged, as it is imperative that you return to your primary care physician (or establish a relationship with a primary care physician if  you do not have one) for your aftercare needs so that they can reassess your need for medications and monitor your lab values.    Today   CHIEF COMPLAINT:   Chief Complaint  Patient presents with  . Weakness  . Shortness of Breath  . Leg Swelling    HISTORY OF PRESENT ILLNESS:  Katelynn Heidler  is a 86 y.o. female came in with shortness of breath, weakness and leg swelling.   VITAL SIGNS:  Blood pressure 106/67, pulse (!) 108, temperature 98.4 F (36.9 C), temperature source Oral, resp. rate 17, height 5\' 8"  (1.727 m), weight 86.3 kg, SpO2 92 %. all previous heart rates were in the 80s and 90s   PHYSICAL EXAMINATION:  GENERAL:  86 y.o.-year-old patient lying in the bed with no acute distress.  EYES: Pupils equal, round, reactive to light and accommodation. No scleral icterus. HEENT: Head atraumatic, normocephalic. Oropharynx and nasopharynx clear.  LUNGS: Decreased breath sounds bilateral bases, no wheezing, rales,rhonchi or crepitation.  No use of accessory muscles of respiration.  CARDIOVASCULAR: S1, S2 irregular regular. No murmurs, rubs, or gallops.  ABDOMEN: Soft, non-tender, non-distended. Bowel sounds present. EXTREMITIES: Trace pedal edema.  NEUROLOGIC: Cranial nerves II through XII are intact. Muscle strength 5/5 in all extremities. Sensation intact. Gait not checked.  PSYCHIATRIC: The patient is alert and oriented x 3.  SKIN: No obvious rash, lesion, or ulcer.   DATA REVIEW:   CBC Recent Labs  Lab 07/24/20 0432  WBC 5.0  HGB 8.2*  HCT 25.9*  PLT 122*    Chemistries  Recent Labs  Lab 07/18/20 1228 07/19/20 0854 07/22/20 0547 07/22/20 0547 07/24/20 0432  NA 136   < > 140   < > 140  K 4.8   < > 3.7   < > 3.9  CL 103   < > 103   < > 102  CO2 24   < > 27   < > 28  GLUCOSE 118*   < > 92   < > 94  BUN 44*   < > 41*   < > 38*  CREATININE 1.96*   < > 2.15*   < > 1.82*  CALCIUM 8.6*   < > 9.1   < > 8.8*  MG 1.9  --   --   --   --   AST 13*   < > 18   --   --   ALT 7   < > 7  --   --   ALKPHOS 71   < > 61  --   --   BILITOT 1.1   < > 0.8  --   --    < > = values in this interval not displayed.    Microbiology Results  Results for orders placed or performed during the hospital encounter of 07/18/20  Respiratory Panel by RT PCR (Flu A&B, Covid) - Nasopharyngeal Swab     Status: None   Collection Time: 07/18/20 12:28 PM   Specimen: Nasopharyngeal Swab  Result Value Ref Range Status   SARS Coronavirus 2 by RT PCR NEGATIVE NEGATIVE Final    Comment: (NOTE) SARS-CoV-2 target nucleic acids are NOT DETECTED.  The SARS-CoV-2 RNA is generally detectable in upper respiratoy specimens during the acute phase of infection. The lowest concentration of SARS-CoV-2 viral copies this assay can detect is 131 copies/mL. A negative result does not preclude SARS-Cov-2 infection and should not be used as the sole basis for treatment or other patient management decisions. A negative result may occur with  improper specimen collection/handling, submission of specimen other than nasopharyngeal swab, presence of viral mutation(s) within the areas targeted by this assay, and inadequate number of viral copies (<131 copies/mL). A negative result must be combined with clinical observations, patient history, and epidemiological information. The expected result is Negative.  Fact Sheet for Patients:  PinkCheek.be  Fact Sheet for Healthcare Providers:  GravelBags.it  This test is no t yet approved or cleared by the [place] FDA and  has been authorized for detection and/or diagnosis of SARS-CoV-2 by FDA under an Emergency Use Authorization (EUA). This EUA will remain  in effect (meaning this test can be used) for the duration of the COVID-19 declaration under Section 564(b)(1) of the Act, 21 U.S.C. section 360bbb-3(b)(1), unless the authorization is terminated or revoked sooner.     Influenza  A by PCR NEGATIVE NEGATIVE Final   Influenza B by PCR NEGATIVE NEGATIVE Final    Comment: (NOTE) The  Xpert Xpress SARS-CoV-2/FLU/RSV assay is intended as an aid in  the diagnosis of influenza from Nasopharyngeal swab specimens and  should not be used as a sole basis for treatment. Nasal washings and  aspirates are unacceptable for Xpert Xpress SARS-CoV-2/FLU/RSV  testing.  Fact Sheet for Patients: PinkCheek.be  Fact Sheet for Healthcare Providers: GravelBags.it  This test is not yet approved or cleared by the [place] FDA and  has been authorized for detection and/or diagnosis of SARS-CoV-2 by  FDA under an Emergency Use Authorization (EUA). This EUA will remain  in effect (meaning this test can be used) for the duration of the  Covid-19 declaration under Section 564(b)(1) of the Act, 21  U.S.C. section 360bbb-3(b)(1), unless the authorization is  terminated or revoked. Performed at [facility], [address] [place],  [address]     RADIOLOGY:  [facility] Chest Port 1 View  Result Date: 07/24/2020 CLINICAL DATA:  Follow-up heart failure EXAM: PORTABLE CHEST 1 VIEW COMPARISON:  07/21/2020 FINDINGS: Cardiomegaly and aortic atherosclerosis as seen previously. Bilateral effusions are similar or minimally larger. Dependent atelectasis associated with those effusions. Pulmonary venous hypertension with mild interstitial edema as seen previously. IMPRESSION: Continued evidence of congestive heart failure with pulmonary venous hypertension, mild interstitial edema and bilateral effusions. Effusions are slightly larger than seen previously. Electronically Signed   By: Nelson Chimes M.D.   On: 07/24/2020 08:10      Management plans discussed with the patient, family and they are in agreement.  CODE STATUS:     Code Status Orders  (From admission, onward)         Start     Ordered   07/18/20 1354  Full code   Continuous        07/18/20 1357        Code Status History    Date Active Date Inactive Code Status Order ID Comments User Context   09/24/2016 0324 09/27/2016 1854 Full Code [id number]  Saundra Shelling, MD ED   09/12/2016 1728 09/16/2016 1524 Full Code [id number]  Theodoro Grist, MD ED   Advance Care Planning Activity      TOTAL TIME TAKING CARE OF THIS PATIENT: 35 minutes.    Loletha Grayer M.D on 07/24/2020 at 5:18 PM  Between 7am to 6pm - Pager - [phone]  After 6pm go to [email or web address] - password [email or web address]  [facility]  CC: Primary care physician; Sofie Hartigan, MD

## 2020-07-24 NOTE — Telephone Encounter (Signed)
-----   Message from Lin Landsman, MD sent at 07/21/2020  4:00 PM EST ----- Regarding: Clinic appointment  Caryl Pina  Can you make sure pt has appointment with me in week of December when I'm on call. MD:[email or web address]  Thanks  RV

## 2020-07-24 NOTE — Progress Notes (Signed)
[facility] Encounter Note  Patient: Cinderella Christoffersen [facility] / Admit Date: 07/18/2020 / Date of Encounter: 07/24/2020, 7:28 AM   Subjective: 11/20 the patient has slept very well overnight with no current evidence of significant new cardiovascular symptoms.  There is no evidence of cough congestion or anginal symptoms.  The patient has had much better heart rate and blood pressure control with current medical regimen changes including increased diltiazem with carvedilol dose.  The patient continues to have a low hemoglobin of 8 and glomerular filtration rate of 22.  11/21 patient has felt much better at this time and is sitting up in the bed breathing normally.  There has been no evidence of chest pain weakness fatigue or shortness of breath.  There is no apparent cough or congestion.  The patient does have a hemoglobin of 7.9 now stable.  Overall echocardiogram as listed below and unchanged from before.  Glomerular filtration rate is stable at 21.  Telemetry has been stable with control of heart rate at 91 bpm at rest.  11/22 patient has been doing relatively well with no evidence of chest discomfort or significant shortness of breath although the patient has had oxygen supplementation from yesterday.  The patient did receive 1 dose of intravenous Lasix for some shortness of breath but also has had some wheezing as well.  The patient therefore may need a chest x-ray today to further evaluate further treatment.  We have been careful of not exacerbating chronic kidney disease stage IV with Lasix dosages unless necessary.  Overall heart rate control has been reasonable still at 100 bpm.  With no evidence of symptoms  Echocardiogram shows normal LV systolic function with ejection fraction of 60% no evidence of significant valvular heart disease contributing to below  Review of Systems: Positive for: None Negative for: Vision change, hearing change, syncope, dizziness, nausea,  vomiting,diarrhea, bloody stool, stomach pain, cough, congestion, diaphoresis, urinary frequency, urinary pain,skin lesions, skin rashes Others previously listed  Objective: Telemetry: Atrial fibrillation with controlled ventricular rate Physical Exam: Blood pressure 106/69, pulse 88, temperature 98.2 F (36.8 C), resp. rate 18, height 5\' 8"  (1.727 m), weight 86.7 kg, SpO2 95 %. Body mass index is 29.06 kg/m. General: Well developed, well nourished, in no acute distress. Head: Normocephalic, atraumatic, sclera non-icteric, no xanthomas, nares are without discharge. Neck: No apparent masses Lungs: Normal respirations with few wheezes, no rhonchi, no rales , no crackles   Heart: Irregular rate and rhythm, normal S1 S2, no murmur, no rub, no gallop, PMI is normal size and placement, carotid upstroke normal without bruit, jugular venous pressure normal Abdomen: Soft, non-tender, non-distended with normoactive bowel sounds. No hepatosplenomegaly. Abdominal aorta is normal size without bruit Extremities: Trace edema, no clubbing, no cyanosis, no ulcers,  Peripheral: 2+ radial, 2+ femoral, 2+ dorsal pedal pulses Neuro: Alert and oriented. Moves all extremities spontaneously. Psych:  Responds to questions appropriately with a normal affect.   Intake/Output Summary (Last 24 hours) at 07/24/2020 0728 Last data filed at 07/23/2020 2009 Gross per 24 hour  Intake 720 ml  Output 0 ml  Net 720 ml    Inpatient Medications:  . carvedilol  6.25 mg Oral BID WC  . diltiazem  180 mg Oral Daily  . enoxaparin (LOVENOX) injection  30 mg Subcutaneous Q24H  . mouth rinse  15 mL Mouth Rinse BID  . pantoprazole  40 mg Oral Daily  . sodium chloride flush  3 mL Intravenous Q12H  . sodium chloride flush  3 mL Intravenous Q12H   Infusions:  . sodium chloride      Labs: Recent Labs    07/22/20 0547 07/24/20 0432  NA 140 140  K 3.7 3.9  CL 103 102  CO2 27 28  GLUCOSE 92 94  BUN 41* 38*  CREATININE  2.15* 1.82*  CALCIUM 9.1 8.8*   Recent Labs    07/22/20 0547  AST 18  ALT 7  ALKPHOS 61  BILITOT 0.8  PROT 6.8  ALBUMIN 3.8   Recent Labs    07/22/20 0547 07/24/20 0432  WBC 6.2 5.0  NEUTROABS 2.6  --   HGB 7.9* 8.2*  HCT 25.1* 25.9*  MCV 94.0 94.9  PLT 124* 122*   No results for input(s): CKTOTAL, CKMB, TROPONINI in the last 72 hours. Invalid input(s): POCBNP No results for input(s): HGBA1C in the last 72 hours.   Weights: Filed Weights   07/21/20 0431 07/22/20 0459 07/23/20 0454  Weight: 87.6 kg 89.5 kg 86.7 kg     Radiology/Studies:  DG Chest 1 View  Result Date: 07/18/2020 CLINICAL DATA:  86 year old female with shortness of breath EXAM: CHEST  1 VIEW COMPARISON:  09/24/2016 FINDINGS: Cardiomediastinal silhouette unchanged. Low lung volumes persist. Interlobular septal thickening. New blunting of the bilateral costophrenic angles with partial obscuration of the hemidiaphragms. No pneumothorax. Persisting coarsened interstitial opacities. IMPRESSION: Acute CHF with small pleural effusions, with background of chronic lung changes Electronically Signed   By: Corrie Mckusick D.O.   On: 07/18/2020 13:06   DG Chest 2 View  Result Date: 07/21/2020 CLINICAL DATA:  Pleural effusion.  History of respiratory failure. EXAM: CHEST - 2 VIEW COMPARISON:  CT chest 07/18/2020.  Chest x-ray 07/18/2020. FINDINGS: Stable cardiomegaly. Interim partial resolution of bilateral interstitial prominence suggesting resolving interstitial edema. Bibasilar subsegmental atelectasis. Stable small bilateral pleural effusions. No pneumothorax no acute bony abnormality. IMPRESSION: 1. Stable cardiomegaly. Interim partial resolution of bilateral interstitial prominence suggesting resolving interstitial edema. Stable small bilateral pleural effusions. 2.  Bibasilar subsegmental atelectasis. Electronically Signed   By: Marcello Moores  Register   On: 07/21/2020 07:04   CT CHEST WO CONTRAST  Result Date:  07/18/2020 CLINICAL DATA:  Respiratory failure, leg swelling EXAM: CT CHEST WITHOUT CONTRAST TECHNIQUE: Multidetector CT imaging of the chest was performed following the standard protocol without IV contrast. COMPARISON:  None. FINDINGS: Cardiovascular: There is extensive multi-vessel coronary artery calcification. Extensive calcification of the mitral valve annulus. Extensive senescent calcifications of the aortic valve leaflets. Mild global cardiomegaly. No pericardial effusion. The central pulmonary arteries are enlarged in keeping with changes of pulmonary arterial hypertension. Moderate atherosclerotic calcification within the thoracic aorta. No aortic aneurysm. Mediastinum/Nodes: 15 mm right thyroid nodule noted. There is shotty mediastinal and paraesophageal lymphadenopathy without frank pathologic enlargement. These are nonspecific and may be reactive in nature. The esophagus is patulous, a finding that can be seen with gastroesophageal reflux or esophageal dysmotility. Lungs/Pleura: Moderate bilateral pleural effusions are present with compressive atelectasis of the lower lobes bilaterally. There is smooth interlobular septal thickening, best appreciated at the lung apices, compatible with trace pulmonary edema. No focal consolidation. No central obstructing lesion. No pneumothorax. Upper Abdomen: No acute abnormality. Musculoskeletal: No lytic or blastic bone lesions are seen. IMPRESSION: Trace pulmonary edema and moderate bilateral pleural effusions in keeping with mild cardiogenic failure. Extensive coronary artery calcification. Extensive calcification of the mitral valve annulus and aortic valve leaflets. This can be associated with valvular pathology and would be better assessed with echocardiography. Morphologic changes in  keeping with pulmonary arterial hypertension. 15 mm right thyroid nodule. Recommend thyroid US (ref: J Am Coll Radiol. 2015 Feb;12(2): 143-50). Aortic Atherosclerosis  (ICD10-I70.0). Electronically Signed   By: Fidela Salisbury MD   On: 07/18/2020 23:13   US Venous Img Lower Bilateral (DVT)  Result Date: 07/19/2020 CLINICAL DATA:  86 year old with lower extremity edema. EXAM: BILATERAL LOWER EXTREMITY VENOUS DOPPLER ULTRASOUND TECHNIQUE: Gray-scale sonography with graded compression, as well as color Doppler and duplex ultrasound were performed to evaluate the lower extremity deep venous systems from the level of the common femoral vein and including the common femoral, femoral, profunda femoral, popliteal and calf veins including the posterior tibial, peroneal and gastrocnemius veins when visible. The superficial great saphenous vein was also interrogated. Spectral Doppler was utilized to evaluate flow at rest and with distal augmentation maneuvers in the common femoral, femoral and popliteal veins. COMPARISON:  None. FINDINGS: RIGHT LOWER EXTREMITY Common Femoral Vein: No evidence of thrombus. Normal compressibility, respiratory phasicity and response to augmentation. Saphenofemoral Junction: No evidence of thrombus. Normal compressibility and flow on color Doppler imaging. Profunda Femoral Vein: No evidence of thrombus. Normal compressibility and flow on color Doppler imaging. Femoral Vein: No evidence of thrombus. Normal compressibility, respiratory phasicity and response to augmentation. Popliteal Vein: No evidence of thrombus. Normal compressibility, respiratory phasicity and response to augmentation. Calf Veins: No evidence of thrombus. Normal compressibility and flow on color Doppler imaging. Other Findings: Small fluid collection in the right popliteal fossa that measures 2.9 x 0.8 x 1.6 cm. There was a similar small fluid collection in this area on the prior examination. LEFT LOWER EXTREMITY Common Femoral Vein: No evidence of thrombus. Normal compressibility, respiratory phasicity and response to augmentation. Saphenofemoral Junction: No evidence of thrombus. Normal  compressibility and flow on color Doppler imaging. Profunda Femoral Vein: No evidence of thrombus. Normal compressibility and flow on color Doppler imaging. Femoral Vein: No evidence of thrombus. Normal compressibility, respiratory phasicity and response to augmentation. Popliteal Vein: No evidence of thrombus. Normal compressibility, respiratory phasicity and response to augmentation. Calf Veins: No evidence of thrombus. Normal compressibility and flow on color Doppler imaging. Other Findings: Again noted is a complex fluid collection in the left popliteal fossa that measures 3.9 x 1.9 x 2.3 cm. Previously this collection measured 5.7 x 3.2 x 3.6 cm. IMPRESSION: No evidence of deep venous thrombosis in either lower extremity. Bilateral Baker's cysts, left side greater than right. The left knee Baker's cyst has decreased in size since August 2021. Electronically Signed   By: Markus Daft M.D.   On: 07/19/2020 14:39   ECHOCARDIOGRAM COMPLETE  Result Date: 07/19/2020    ECHOCARDIOGRAM REPORT   Patient Name:   TEKLA MALACHOWSKI [facility] Date of Exam: 07/18/2020 Medical Rec #:  [id number]               Height:       68.0 in Accession #:    [id number]              Weight:       200.0 lb Date of Birth:  1928-11-25               BSA:          2.044 m Patient Age:    46 years                BP:           107/79 mmHg Patient Gender: F  HR:           94 bpm. Exam Location:  [facility] Procedure: 2D Echo, Cardiac Doppler and Color Doppler Indications:     N36.14 Acute Diastolic Heart Failure  History:         Patient has no prior history of Echocardiogram examinations.                  Risk Factors:Hypertension. Pneumonia.  Sonographer:     Wilford Sports Rodgers-Jones Referring Phys:  [id number] Clabe Seal Diagnosing Phys: Isaias Cowman MD IMPRESSIONS  1. Left ventricular ejection fraction, by estimation, is 55 to 60%. The left ventricle has normal function. The left ventricle has no regional wall motion  abnormalities. There is mild left ventricular hypertrophy. Left ventricular diastolic parameters are indeterminate.  2. Right ventricular systolic function is normal. The right ventricular size is normal.  3. Left atrial size was moderately dilated.  4. Right atrial size was mildly dilated.  5. The mitral valve is normal in structure. Mild to moderate mitral valve regurgitation. No evidence of mitral stenosis.  6. Tricuspid valve regurgitation is moderate.  7. The aortic valve is normal in structure. Aortic valve regurgitation is not visualized. Moderate aortic valve stenosis.  8. The inferior vena cava is normal in size with greater than 50% respiratory variability, suggesting right atrial pressure of 3 mmHg. FINDINGS  Left Ventricle: Left ventricular ejection fraction, by estimation, is 55 to 60%. The left ventricle has normal function. The left ventricle has no regional wall motion abnormalities. The left ventricular internal cavity size was normal in size. There is  mild left ventricular hypertrophy. Left ventricular diastolic parameters are indeterminate. Right Ventricle: The right ventricular size is normal. No increase in right ventricular wall thickness. Right ventricular systolic function is normal. Left Atrium: Left atrial size was moderately dilated. Right Atrium: Right atrial size was mildly dilated. Pericardium: There is no evidence of pericardial effusion. Mitral Valve: The mitral valve is normal in structure. Mild to moderate mitral valve regurgitation. No evidence of mitral valve stenosis. Tricuspid Valve: The tricuspid valve is normal in structure. Tricuspid valve regurgitation is moderate . No evidence of tricuspid stenosis. Aortic Valve: The aortic valve is normal in structure. Aortic valve regurgitation is not visualized. Moderate aortic stenosis is present. Aortic valve mean gradient measures 17.7 mmHg. Aortic valve peak gradient measures 28.4 mmHg. Aortic valve area, by VTI measures 0.69 cm.  Pulmonic Valve: The pulmonic valve was normal in structure. Pulmonic valve regurgitation is not visualized. No evidence of pulmonic stenosis. Aorta: The aortic root is normal in size and structure. Venous: The inferior vena cava is normal in size with greater than 50% respiratory variability, suggesting right atrial pressure of 3 mmHg. IAS/Shunts: No atrial level shunt detected by color flow Doppler.  LEFT VENTRICLE PLAX 2D LVIDd:         4.46 cm LVIDs:         3.07 cm LV PW:         1.17 cm LV IVS:        1.16 cm LVOT diam:     1.80 cm LV SV:         42 LV SV Index:   20 LVOT Area:     2.54 cm  RIGHT VENTRICLE            IVC RV Basal diam:  3.77 cm    IVC diam: 2.27 cm RV S prime:     9.09 cm/s TAPSE (M-mode): 1.4 cm LEFT  ATRIUM              Index       RIGHT ATRIUM           Index LA diam:        6.80 cm  3.33 cm/m  RA Area:     23.20 cm LA Vol (A2C):   192.0 ml 93.95 ml/m RA Volume:   73.70 ml  36.06 ml/m LA Vol (A4C):   144.0 ml 70.46 ml/m LA Biplane Vol: 170.0 ml 83.18 ml/m  AORTIC VALVE AV Area (Vmax):    0.74 cm AV Area (Vmean):   0.77 cm AV Area (VTI):     0.69 cm AV Vmax:           266.67 cm/s AV Vmean:          200.000 cm/s AV VTI:            0.605 m AV Peak Grad:      28.4 mmHg AV Mean Grad:      17.7 mmHg LVOT Vmax:         78.00 cm/s LVOT Vmean:        60.900 cm/s LVOT VTI:          0.163 m LVOT/AV VTI ratio: 0.27  AORTA Ao Root diam: 2.90 cm MV E velocity: 166.75 cm/s  TRICUSPID VALVE                             TR Peak grad:   34.1 mmHg                             TR Vmax:        292.00 cm/s                              SHUNTS                             Systemic VTI:  0.16 m                             Systemic Diam: 1.80 cm Isaias Cowman MD Electronically signed by Isaias Cowman MD Signature Date/Time: 07/19/2020/1:14:49 PM    Final      Assessment and Recommendation  86 y.o. female with known hypertension hyperlipidemia chronic kidney disease stage IV with acute anemia and  new onset of atrial fibrillation with rapid ventricular rate now more controlled on appropriate medication management without evidence of myocardial infarction or congestive heart failure 1.  Continuation of current combination of medication management including diltiazem and carvedilol and will follow with ambulation to adjust dosage of diltiazem for heart rate control between 60 and 90 bpm at rest.  Will evaluate by ambulation this afternoon to see if patient needs adjustments but mainly will make more adjustments as an outpatient 2.  Continue to avoid anticoagulation at this time due to concerns of severe anemia chronic kidney disease and possible causes of these at this time.  Will consider further addition of anticoagulation when able as an outpatient 3.  No further cardiac diagnostics necessary at this time due to no evidence of significant LV systolic dysfunction anginal symptoms or congestive heart failure 4.  We will obtain chest x-ray today to  further evaluate the possibility of acute on chronic diastolic dysfunction heart failure with some pulmonary edema and/or other causes of her respiratory failure to make further decisions of treatment options 5.  Dr. Clayborn Bigness will be taking over care from the cardiovascular standpoint  Signed, Serafina Royals M.D. FACC

## 2020-07-26 ENCOUNTER — Telehealth: Payer: Self-pay

## 2020-07-26 NOTE — Telephone Encounter (Signed)
-----   Message from Lequita Asal, MD sent at 07/25/2020  4:29 PM EST ----- Regarding: FW: Question  Please call patient.  She should come in for labs and Retacrit whenever she can.  She is anemic.  Once she comes in, her next lab and Retacrit appts will need to be moved 2 weeks out.  M  ----- Message ----- From: Secundino Ginger Sent: 07/25/2020   9:52 AM EST To: Lequita Asal, MD Subject: Question                                       Felicia [name] was discharges yesterday. She missed her appt lab/ Retacrit yesterday and she wants to know what she should do. She said she doesn't have the strength to come in to her appt yet. The next one scheduled is 12/6

## 2020-07-26 NOTE — Telephone Encounter (Signed)
Post hospital call to patient.  She states that she feels that she is doing " Okay" but remains weak.  She states that the visiting nurse has not come yet to her home.  One problem that she voiced is that the of the oxygen tubing is too short and she cannot go very far is unable to handle a cane and the oxygen tank.  But she states that her shortness of breath is improving.  She is not weighing herself daily.  She states that she is taking her medications as instructed from the hospital.   She states that her doctor that handles her anemia has been calling for her to make an appointment she states right now transportation is a problem.  He is going to discuss this with her son.

## 2020-07-26 NOTE — Telephone Encounter (Signed)
-----   Message from Secundino Ginger sent at 07/26/2020  3:44 PM EST ----- Regarding: RE: Question Yes I did but I saw your comment. She is going to call me Monday and tell me how she feels ----- Message ----- From: Drue Dun, RN Sent: 07/26/2020   3:21 PM EST To: Secundino Ginger Subject: FW: Question                                   Have you called this patient to reschedule? ----- Message ----- From: Lequita Asal, MD Sent: 07/25/2020   4:29 PM EST To: Secundino Ginger, Drue Dun, RN Subject: FW: Question                                    Please call patient.  She should come in for labs and Retacrit whenever she can.  She is anemic.  Once she comes in, her next lab and Retacrit appts will need to be moved 2 weeks out.  M  ----- Message ----- From: Secundino Ginger Sent: 07/25/2020   9:52 AM EST To: Lequita Asal, MD Subject: Question                                       Felicia [name] was discharges yesterday. She missed her appt lab/ Retacrit yesterday and she wants to know what she should do. She said she doesn't have the strength to come in to her appt yet. The next one scheduled is 12/6

## 2020-07-26 NOTE — Telephone Encounter (Signed)
Left message for patient to call back  

## 2020-07-26 NOTE — Telephone Encounter (Signed)
Patient aware. She states that she has transportation issues and she is weak and on oxygen so it takes a lot to come in

## 2020-08-01 ENCOUNTER — Other Ambulatory Visit: Payer: Self-pay

## 2020-08-01 ENCOUNTER — Encounter: Payer: Self-pay | Admitting: Emergency Medicine

## 2020-08-01 ENCOUNTER — Emergency Department: Payer: Medicare Other

## 2020-08-01 ENCOUNTER — Emergency Department
Admission: EM | Admit: 2020-08-01 | Discharge: 2020-08-02 | Disposition: A | Discharge status: Home / Self Care | Payer: Medicare Other | Attending: Emergency Medicine | Admitting: Emergency Medicine

## 2020-08-01 DIAGNOSIS — I4891 Unspecified atrial fibrillation: Secondary | ICD-10-CM | POA: Diagnosis not present

## 2020-08-01 DIAGNOSIS — I130 Hypertensive heart and chronic kidney disease with heart failure and stage 1 through stage 4 chronic kidney disease, or unspecified chronic kidney disease: Secondary | ICD-10-CM | POA: Diagnosis not present

## 2020-08-01 DIAGNOSIS — I2510 Atherosclerotic heart disease of native coronary artery without angina pectoris: Secondary | ICD-10-CM | POA: Diagnosis not present

## 2020-08-01 DIAGNOSIS — N184 Chronic kidney disease, stage 4 (severe): Secondary | ICD-10-CM | POA: Insufficient documentation

## 2020-08-01 DIAGNOSIS — I5033 Acute on chronic diastolic (congestive) heart failure: Secondary | ICD-10-CM | POA: Insufficient documentation

## 2020-08-01 DIAGNOSIS — Z79899 Other long term (current) drug therapy: Secondary | ICD-10-CM | POA: Diagnosis not present

## 2020-08-01 DIAGNOSIS — R600 Localized edema: Secondary | ICD-10-CM | POA: Diagnosis not present

## 2020-08-01 DIAGNOSIS — R0602 Shortness of breath: Secondary | ICD-10-CM | POA: Diagnosis present

## 2020-08-01 DIAGNOSIS — R609 Edema, unspecified: Secondary | ICD-10-CM

## 2020-08-01 HISTORY — DX: Atherosclerotic heart disease of native coronary artery without angina pectoris: I25.10

## 2020-08-01 HISTORY — DX: Chronic kidney disease, stage 4 (severe): N18.4

## 2020-08-01 HISTORY — DX: Pulmonary hypertension, unspecified: I27.20

## 2020-08-01 HISTORY — DX: Unspecified atrial fibrillation: I48.91

## 2020-08-01 HISTORY — DX: Disorder of kidney and ureter, unspecified: N28.9

## 2020-08-01 HISTORY — DX: Hypertensive heart disease without heart failure: I11.9

## 2020-08-01 LAB — BASIC METABOLIC PANEL
Anion gap: 10 (ref 5–15)
BUN: 34 mg/dL — ABNORMAL HIGH (ref 8–23)
CO2: 29 mmol/L (ref 22–32)
Calcium: 8.8 mg/dL — ABNORMAL LOW (ref 8.9–10.3)
Chloride: 101 mmol/L (ref 98–111)
Creatinine, Ser: 1.9 mg/dL — ABNORMAL HIGH (ref 0.44–1.00)
GFR, Estimated: 25 mL/min — ABNORMAL LOW (ref >60)
Glucose, Bld: 97 mg/dL (ref 70–99)
Potassium: 4.6 mmol/L (ref 3.5–5.1)
Sodium: 140 mmol/L (ref 135–145)

## 2020-08-01 LAB — CBC
HCT: 25.1 % — ABNORMAL LOW (ref 36.0–46.0)
Hemoglobin: 7.8 g/dL — ABNORMAL LOW (ref 12.0–15.0)
MCH: 29 pg (ref 26.0–34.0)
MCHC: 31.1 g/dL (ref 30.0–36.0)
MCV: 93.3 fL (ref 80.0–100.0)
Platelets: 148 10*3/uL — ABNORMAL LOW (ref 150–400)
RBC: 2.69 MIL/uL — ABNORMAL LOW (ref 3.87–5.11)
RDW: 15.2 % (ref 11.5–15.5)
WBC: 6 10*3/uL (ref 4.0–10.5)
nRBC: 0 % (ref 0.0–0.2)

## 2020-08-01 LAB — BRAIN NATRIURETIC PEPTIDE: B Natriuretic Peptide: 616.3 pg/mL — ABNORMAL HIGH (ref 0.0–100.0)

## 2020-08-01 LAB — TROPONIN I (HIGH SENSITIVITY)
Troponin I (High Sensitivity): 27 ng/L — ABNORMAL HIGH (ref <18)
Troponin I (High Sensitivity): 27 ng/L — ABNORMAL HIGH (ref <18)

## 2020-08-01 MED ORDER — DILTIAZEM HCL 25 MG/5ML IV SOLN
10.0000 mg | Freq: Once | INTRAVENOUS | Status: AC
  Administered 2020-08-01: 10 mg via INTRAVENOUS
  Filled 2020-08-01: qty 5

## 2020-08-01 MED ORDER — DILTIAZEM HCL ER COATED BEADS 240 MG PO CP24: 240.0000 mg | ORAL_CAPSULE | Freq: Every day | ORAL | 11 refills | Status: DC

## 2020-08-01 MED ORDER — DILTIAZEM HCL 60 MG PO TABS
30.0000 mg | ORAL_TABLET | Freq: Once | ORAL | Status: AC
  Administered 2020-08-01: 30 mg via ORAL
  Filled 2020-08-01: qty 1

## 2020-08-01 MED ORDER — DILTIAZEM HCL ER COATED BEADS 240 MG PO CP24
240.0000 mg | ORAL_CAPSULE | Freq: Once | ORAL | Status: AC
  Administered 2020-08-02: 240 mg via ORAL
  Filled 2020-08-01: qty 1

## 2020-08-01 MED ORDER — DILTIAZEM HCL ER COATED BEADS 180 MG PO CP24
180.0000 mg | ORAL_CAPSULE | Freq: Every day | ORAL | 0 refills | Status: DC
Start: 2020-08-01 — End: 2020-08-11

## 2020-08-01 NOTE — ED Provider Notes (Addendum)
[facility] Emergency Department Provider Note   ____________________________________________   First MD Initiated Contact with Patient 08/01/20 2200     (approximate)  I have reviewed the triage vital signs and the nursing notes.   HISTORY  Chief Complaint Leg Swelling and Shortness of Breath    HPI Felicia [name] is a 86 y.o. female who reports that her legs are starting to swell up again and she is getting short of breath when she is walking.  She was recently discharged after having had congestive heart failure.  She was sent home with Lasix and diltiazem.  She says she is taking all of her medicines but seems to be getting short of breath again.  She is not having any fever or chest tightness.         Past Medical History:  Diagnosis Date  . Atrial fibrillation ([place])   . CKD (chronic kidney disease), stage IV ([place])   . Coronary artery disease   . GERD (gastroesophageal reflux disease)   . HOH (hard of hearing)   . Hypertension   . LVH (left ventricular hypertrophy) due to hypertensive disease   . Pneumonia   . Pulmonary hypertension ([place])   . Renal disorder   . Vertigo     Patient Active Problem List   Diagnosis Date Noted  . Atrial fibrillation, chronic ([place])   . Acute on chronic diastolic CHF (congestive heart failure) ([place])   . Permanent atrial fibrillation ([place])   . Acquired thrombophilia ([place])   . Weakness   . Respiratory failure with hypoxia ([place]) 07/18/2020  . Atrial fibrillation with RVR ([place]) 07/18/2020  . Monocytosis 10/03/2019  . Normocytic anemia 09/17/2019  . Hyperlipidemia, mixed 09/17/2019  . Peripheral vascular disease ([place]) 09/17/2019  . Anemia of chronic disease 09/17/2019  . ANA positive 09/01/2019  . Flat feet, bilateral 09/01/2019  . CKD (chronic kidney disease), stage IV ([place]) 09/01/2019  . Primary osteoarthritis of hand 09/01/2019  . Dizziness 02/24/2019  . LVH (left ventricular hypertrophy) due  to hypertensive disease, without heart failure 01/28/2018  . HCAP (healthcare-associated pneumonia) 09/24/2016  . Sepsis ([place]) 09/12/2016  . Community acquired pneumonia 09/12/2016  . Hypoxia 09/12/2016  . Pleuritic chest pain 09/12/2016  . Pneumonia 09/12/2016  . Essential hypertension 01/05/2015  . Mild aortic valve stenosis 10/20/2014  . Moderate mitral insufficiency 10/20/2014  . Moderate tricuspid insufficiency 10/20/2014  . Chronic pulmonary hypertension ([place]) 09/22/2014  . Venous insufficiency of both lower extremities 09/22/2014  . CTS (carpal tunnel syndrome) 03/21/2014    Past Surgical History:  Procedure Laterality Date  . APPENDECTOMY  1939  . TONSILLECTOMY  1939    Prior to Admission medications   Medication Sig Start Date End Date Taking? Authorizing Provider  albuterol (VENTOLIN HFA) 108 (90 Base) MCG/ACT inhaler Inhale 2 puffs into the lungs every 6 (six) hours as needed for wheezing or shortness of breath. 07/24/20   Loletha Grayer, MD  carvedilol (COREG) 6.25 MG tablet Take 6.25 mg by mouth 2 (two) times daily with a meal.    [provider]  cyanocobalamin 1000 MCG tablet Take 1,000 mcg by mouth daily.    [provider]  diltiazem (CARDIZEM CD) 180 MG 24 hr capsule Take 1 capsule (180 mg total) by mouth daily. 07/25/20   Loletha Grayer, MD  Ferrous Sulfate (IRON) 325 (65 Fe) MG TABS Take by mouth. Patient not taking: Reported on 07/18/2020    [provider]  furosemide (LASIX) 20 MG  tablet Take 1 tablet (20 mg total) by mouth 2 (two) times daily. 07/24/20   Loletha Grayer, MD  omeprazole (PRILOSEC) 20 MG capsule Take 20 mg by mouth daily.    [provider]    Allergies Patient has no known allergies.  Family History  Problem Relation Age of Onset  . Heart disease Mother   . Goiter Mother   . Bladder Cancer Father 52    Social History Social History   Tobacco Use  . Smoking status: Never Smoker  .  Smokeless tobacco: Never Used  Vaping Use  . Vaping Use: Never used  Substance Use Topics  . Alcohol use: No  . Drug use: No    Review of Systems  Constitutional: No fever/chills Eyes: No visual changes. ENT: No sore throat. Cardiovascular: Denies chest pain. Respiratory:shortness of breath. Gastrointestinal: No abdominal pain.  No nausea, no vomiting.  No diarrhea.  No constipation. Genitourinary: Negative for dysuria. Musculoskeletal: Negative for back pain. Skin: Negative for rash. Neurological: Negative for headaches, focal weakness  ____________________________________________   PHYSICAL EXAM:  VITAL SIGNS: ED Triage Vitals  Enc Vitals Group     BP 08/01/20 2107 121/64     Pulse Rate 08/01/20 2107 (!) 130     Resp 08/01/20 2107 (!) 22     Temp 08/01/20 2107 98.6 F (37 C)     Temp Source 08/01/20 2107 Oral     SpO2 08/01/20 2106 99 %     Weight 08/01/20 2108 190 lb 3.2 oz (86.3 kg)     Height 08/01/20 2108 5\' 8"  (1.727 m)     Head Circumference --      Peak Flow --      Pain Score 08/01/20 2107 0     Pain Loc --      Pain Edu? --      Excl. in [place]? --     Constitutional: Alert and oriented. Well appearing and in no acute distress. Eyes: Conjunctivae are normal. PER. Head: Atraumatic. Nose: No congestion/rhinnorhea. Mouth/Throat: Mucous membranes are moist.  Oropharynx non-erythematous. Neck: No stridor.   Cardiovascular: Rapid rate, irregular rhythm. Grossly normal heart sounds.  Good peripheral circulation. Respiratory: Normal respiratory effort.  No retractions. Lungs CTAB. Gastrointestinal: Soft and nontender. No distention. No abdominal bruits. No CVA tenderness. Musculoskeletal: No lower extremity tenderness mild bilateral edema.  Neurologic:  Normal speech and language. No gross focal neurologic deficits are appreciated.  Skin:  Skin is warm, dry and intact.    ____________________________________________   LABS (all labs ordered are listed, but  only abnormal results are displayed)  Labs Reviewed  BASIC METABOLIC PANEL - Abnormal; Notable for the following components:      Result Value   BUN 34 (*)    Creatinine, Ser 1.90 (*)    Calcium 8.8 (*)    GFR, Estimated 25 (*)    All other components within normal limits  CBC - Abnormal; Notable for the following components:   RBC 2.69 (*)    Hemoglobin 7.8 (*)    HCT 25.1 (*)    Platelets 148 (*)    All other components within normal limits  BRAIN NATRIURETIC PEPTIDE - Abnormal; Notable for the following components:   B Natriuretic Peptide 616.3 (*)    All other components within normal limits  TROPONIN I (HIGH SENSITIVITY) - Abnormal; Notable for the following components:   Troponin I (High Sensitivity) 27 (*)    All other components within normal limits  TROPONIN I (  HIGH SENSITIVITY)   ____________________________________________  EKG  EKG read interpreted by me shows A. fib with RVR at a rate of 114 normal axis no acute ST-T changes ____________________________________________  RADIOLOGY Gertha Calkin, personally viewed and evaluated these images (plain radiographs) as part of my medical decision making, as well as reviewing the written report by the radiologist.  ED MD interpretation: Chest x-ray read by radiology reviewed by me shows some CHF with a left-sided pleural effusion.  This is much better looking than previous chest x-ray from last week  Official radiology report(s): DG Chest 2 View  Result Date: 08/01/2020 CLINICAL DATA:  Shortness of breath, edema EXAM: CHEST - 2 VIEW COMPARISON:  07/24/2020 FINDINGS: Frontal and lateral views of the chest demonstrate a stable enlarged cardiac silhouette. There is persistent left basilar consolidation and effusion, grossly stable since prior study. Decreased right pleural effusion, with marked improvement in aeration at the right lung base. No pneumothorax. No acute bony abnormalities. IMPRESSION: 1. Stable left  basilar consolidation and effusion. 2. Marked improved aeration at the right lung base, with only trace residual right pleural effusion. Electronically Signed   By: Randa Ngo M.D.   On: 08/01/2020 21:30    ____________________________________________   PROCEDURES  Procedure(s) performed (including Critical Care):  Procedures   ____________________________________________   INITIAL IMPRESSION / ASSESSMENT AND PLAN /     Patient does have A. fib with RVR rates fairly high.  We will see if we can bring it down with some IV diltiazem and then may be keep it down with p.o. diltiazem if this works we can probably send her home.  She is not hypoxic when she walks.  Her electrolytes are within normal limits her GFR is stable her H&H is approximately where it has been.  If everything works properly we will discussed the patient with her cardiologist Dr. Nehemiah Massed is on-call for [facility] clinic cardiology who she has seen before.  As none of this has been accomplished yet and its only 5 minutes before the end of my shift I will have to sign the patient out to the oncoming provider.          ____________________________________________   FINAL CLINICAL IMPRESSION(S) / ED DIAGNOSES  Final diagnoses:  Atrial fibrillation with RVR ([place])  Peripheral edema     ED Discharge Orders    None      *Please note:  Cheralyn Oliver was evaluated in Emergency Department on 08/01/2020 for the symptoms described in the history of present illness. She was evaluated in the context of the global COVID-19 pandemic, which necessitated consideration that the patient might be at risk for infection with the SARS-CoV-2 virus that causes COVID-19. Institutional protocols and algorithms that pertain to the evaluation of patients at risk for COVID-19 are in a state of rapid change based on information released by regulatory bodies including the CDC and federal and state organizations. These policies  and algorithms were followed during the patient's care in the ED.  Some ED evaluations and interventions may be delayed as a result of limited staffing during and the pandemic.*   Note:  This document was prepared using Dragon voice recognition software and may include unintentional dictation errors.    Nena Polio, MD 08/01/20 2254 Patient feels better now after she has been sitting for a while and got the diltiazem IV.  Her heart rate is down to 98-1 05.  She thinks her legs are less tight as well.  I discussed her with Dr. Nehemiah Massed.  We will give her 30 diltiazem rapid acting now and followed by 240 of the CD.  He will see her tomorrow.  We will monitor her for some time to make sure that the second troponin is good and that the heart rate remains under control without dropping too low.   Nena Polio, MD 08/01/20 [phone]

## 2020-08-01 NOTE — ED Notes (Signed)
Pt able to ambulate in the hall with walker while on [facility] without difficulty. Pt denies any dyspnea while walking but heart rate remained in mid to upper 130's with oxygen saturation at 90-92%.

## 2020-08-01 NOTE — ED Triage Notes (Signed)
Pt arrived via [facility] from home with reports of BLE swelling, pt recently admitted for the same and DC'd 1 week ago. Pt reports the swelling was up to her thighs when she was admitted.  Pt states she noticed the swelling this afternoon. +pitting edema and painful to touch.

## 2020-08-01 NOTE — ED Provider Notes (Signed)
-----------------------------------------   11:37 PM on 08/01/2020 -----------------------------------------  Assuming care from Dr. Cinda Quest.  In short, Felicia [name] is a 86 y.o. female with a chief complaint of a-fib w/ RVR.  Refer to the original H&P for additional details.  The current plan of care is to monitor after meds, anticipate discharge if doing well.    ----------------------------------------- 1:07 AM on 08/02/2020 -----------------------------------------  We will observe the patient and monitored her for about an hour after receiving her medications.  Her heart rate has remained in the 80s to 90s.  I reassessed her and spoke with her and her Felicia [name] who is at bedside.  She is asymptomatic.  She is still nervous about going home but recognizes that it is probably safer for her than staying unnecessarily in the hospital.  We talked about close follow-up with Dr. Nehemiah Massed later today and the Felicia [name] is going to try to make that happen although it will be hard for him to get her to the clinic in the morning.  They know to contact Dr. Heywood Footman office first thing in the morning.  She was given specific instructions prepared by Dr. Cinda Quest in terms of her medications.  I gave my usual and customary return precautions.   Hinda Kehr, MD 08/02/20 [phone]

## 2020-08-01 NOTE — Discharge Instructions (Addendum)
Please take the diltiazem 240mg  a day instead of the 180mg .  I would take the Cardizem in the evening since we gave you a dose this evening.  Please follow-up with Dr. Nehemiah Massed tomorrow morning.  You can call the office first thing in the morning and let his office staff know that Dr. Cinda Quest spoke with him last night and he wanted to see you in the morning on 1 December.  Please return here for any increasing shortness of breath swelling chest pain or tightness or any other complaints.

## 2020-08-01 NOTE — ED Notes (Signed)
Resumed care from zach rn.  Pt alert.  afib on monitor.  Pt has swelling to both lower legs.  No chest pain.  Family with pt.

## 2020-08-02 DIAGNOSIS — I4891 Unspecified atrial fibrillation: Secondary | ICD-10-CM | POA: Diagnosis not present

## 2020-08-02 LAB — TSH: TSH: 3.912 u[IU]/mL (ref 0.350–4.500)

## 2020-08-03 ENCOUNTER — Telehealth: Payer: Self-pay | Admitting: Family Medicine

## 2020-08-03 ENCOUNTER — Inpatient Hospital Stay: Payer: Medicare Other

## 2020-08-03 NOTE — Telephone Encounter (Signed)
Name: Felicia [name]  MRN: [id number] DOB: 1929-03-30  The transport has been scheduled for 08/07/2020.  Felicia [name] DOB: 12/12/1928 MRN: [id number]   RIDER WAIVER AND RELEASE OF LIABILITY  For purposes of improving physical access to our facilities, [facility] is pleased to partner with third parties to provide [facility] patients or other authorized individuals the option of convenient, on-demand ground transportation services (the [employer]) through use of the technology service that enables users to request on-demand ground transportation from independent third-party providers.  By opting to use and accept these [employer], I, the undersigned, hereby agree on behalf of myself, and on behalf of any minor child using the [employer] for whom I am the parent or legal guardian, as follows:  1. Government social research officer provided to me are provided by independent third-party transportation providers who are not [employer] or employees and who are unaffiliated with [employer]. 2. [facility] is neither a transportation carrier nor a common or public carrier. 3. [facility] has no control over the quality or safety of the transportation that occurs as a result of the [employer]. 4. [facility] cannot guarantee that any third-party transportation provider will complete any arranged transportation service. 5. [facility] makes no representation, warranty, or guarantee regarding the reliability, timeliness, quality, safety, suitability, or availability of any of the Transport Services or that they will be error free. 6. I fully understand that traveling by vehicle involves risks and dangers of serious bodily injury, including permanent disability, paralysis, and death. I agree, on behalf of myself and on behalf of any minor child using the Transport Services for whom I am the parent or legal guardian, that the entire risk arising out of my use of the  [employer] remains solely with me, to the maximum extent permitted under applicable law. 7. The [employer] are provided as is and as available. [facility] disclaims all representations and warranties, express, implied or statutory, not expressly set out in these terms, including the implied warranties of merchantability and fitness for a particular purpose. 8. I hereby waive and release [place], its agents, employees, officers, directors, representatives, insurers, attorneys, assigns, successors, subsidiaries, and affiliates from any and all past, present, or future claims, demands, liabilities, actions, causes of action, or suits of any kind directly or indirectly arising from acceptance and use of the [employer]. 9. I further waive and release [facility] and its affiliates from all present and future liability and responsibility for any injury or death to persons or damages to property caused by or related to the use of the [employer]. 10. I have read this Waiver and Release of Liability, and I understand the terms used in it and their legal significance. This Waiver is freely and voluntarily given with the understanding that my right (as well as the right of any minor child for whom I am the parent or legal guardian using the [employer]) to legal recourse against [place] in connection with the [employer] is knowingly surrendered in return for use of these services.   I attest that I read the consent document to Lowella Dell, gave Ms. Tamburri the opportunity to ask questions and answered the questions asked (if any). I affirm that [place] then provided consent for she's participation in this program.     Felicia [name]

## 2020-08-06 DIAGNOSIS — R0600 Dyspnea, unspecified: Secondary | ICD-10-CM | POA: Insufficient documentation

## 2020-08-07 ENCOUNTER — Inpatient Hospital Stay: Payer: Medicare Other

## 2020-08-07 ENCOUNTER — Inpatient Hospital Stay: Payer: Medicare Other | Attending: Hematology and Oncology

## 2020-08-07 ENCOUNTER — Other Ambulatory Visit: Payer: Self-pay

## 2020-08-07 VITALS — BP 122/80 | HR 74 | Resp 18

## 2020-08-07 DIAGNOSIS — D638 Anemia in other chronic diseases classified elsewhere: Secondary | ICD-10-CM

## 2020-08-07 DIAGNOSIS — D631 Anemia in chronic kidney disease: Secondary | ICD-10-CM

## 2020-08-07 DIAGNOSIS — N184 Chronic kidney disease, stage 4 (severe): Secondary | ICD-10-CM | POA: Insufficient documentation

## 2020-08-07 LAB — HEMOGLOBIN AND HEMATOCRIT, BLOOD
HCT: 24.6 % — ABNORMAL LOW (ref 36.0–46.0)
Hemoglobin: 7.9 g/dL — ABNORMAL LOW (ref 12.0–15.0)

## 2020-08-07 MED ORDER — EPOETIN ALFA-EPBX 10000 UNIT/ML IJ SOLN
20000.0000 [IU] | Freq: Once | INTRAMUSCULAR | Status: AC
  Administered 2020-08-07: 20000 [IU] via SUBCUTANEOUS
  Filled 2020-08-07: qty 2

## 2020-08-07 MED ORDER — EPOETIN ALFA-EPBX 40000 UNIT/ML IJ SOLN: 20000.0000 [IU] | Freq: Once | INTRAMUSCULAR | Status: DC

## 2020-08-09 ENCOUNTER — Ambulatory Visit: Payer: Medicare Other | Admitting: Family

## 2020-08-10 ENCOUNTER — Ambulatory Visit (INDEPENDENT_AMBULATORY_CARE_PROVIDER_SITE_OTHER): Payer: Medicare Other | Admitting: Gastroenterology

## 2020-08-10 ENCOUNTER — Encounter: Payer: Self-pay | Admitting: Gastroenterology

## 2020-08-10 VITALS — BP 98/60 | HR 111 | Ht 69.0 in | Wt 186.6 lb

## 2020-08-10 DIAGNOSIS — Z862 Personal history of diseases of the blood and blood-forming organs and certain disorders involving the immune mechanism: Secondary | ICD-10-CM

## 2020-08-10 DIAGNOSIS — D638 Anemia in other chronic diseases classified elsewhere: Secondary | ICD-10-CM

## 2020-08-10 DIAGNOSIS — N189 Chronic kidney disease, unspecified: Secondary | ICD-10-CM | POA: Diagnosis not present

## 2020-08-10 NOTE — Progress Notes (Signed)
Cephas Darby, MD [address]  [place]  [place], [place] [address]  Main: [phone]  Fax: [phone]    Gastroenterology Consultation  Referring Provider:     Sofie Hartigan, MD Primary Care Physician:  Sofie Hartigan, MD Primary Gastroenterologist:  Dr. Cephas Darby Reason for Consultation:     Anemia        HPI:   Bretta Fees is a 86 y.o. female referred by Dr. Sofie Hartigan, MD  for consultation & management of  Anemia Patient has history of CKD stage IV, anemia of chronic kidney disease.  Patient was admitted to [facility] in November secondary to new onset of A. fib with RVR, rate controlled with appropriate medication management, no evidence of myocardial infarction or CHF.  Anticoagulation has been avoided due to her history of severe anemia which is secondary to chronic kidney disease.  Patient is referred to GI for further evaluation of anemia.  Since discharge, patient reports that her swelling of legs has almost resolved.  She reports doing well, back to baseline.  She is on ambulatory oxygen at 2 L, independent of ADLs, lives alone.  She is closely followed by Dr. Mike Gip and receives erythropoietin injection every other week.  With regards to her anemia, she has chronic normocytic anemia at least since 2018, her hemoglobin generally ranges between 8 and 9.  Most recently 7.9 on 12/6.  Patient denies black stools, rectal bleeding, abdominal pain, reflux, dysphagia.  She denies any GI symptoms at this time.  Her iron studies are consistent with anemia of chronic disease.  She has normal B12 and folate levels.  NSAIDs: None  Antiplts/Anticoagulants/Anti thrombotics: None  GI Procedures: Reports having had a colonoscopy several years ago Underwent EGD 05/2011 Diagnosis:  Part A: GASTRIC ANTRUM AND BODY BIOPSY:  - ANTRAL MUCOSA WITH CHRONIC GASTRITIS AND INTESTINAL METAPLASIA.  - OXYNTIC MUCOSA WITH MILD CHRONIC GASTRITIS.  - DIFF-QUIK  STAIN IS NEGATIVE FOR ORGANISMS CONSISTENT WITH  HELICOBACTER PYLORI. CONTROL STAINED APPROPRIATELY.  .  Part B: GASTROESOPHAGEAL JUNCTION BIOPSY:  - COLUMNAR-LINED MUCOSA WITH MARKED CHRONIC ACTIVE INFLAMMATION.  - NO INTESTINAL METAPLASIA SEEN IN THIS SAMPLE.    Past Medical History:  Diagnosis Date  . Atrial fibrillation ([place])   . CKD (chronic kidney disease), stage IV ([place])   . Coronary artery disease   . GERD (gastroesophageal reflux disease)   . HOH (hard of hearing)   . Hypertension   . LVH (left ventricular hypertrophy) due to hypertensive disease   . Pneumonia   . Pulmonary hypertension ([place])   . Renal disorder   . Vertigo     Past Surgical History:  Procedure Laterality Date  . APPENDECTOMY  1939  . TONSILLECTOMY  1939    Current Outpatient Medications:  .  diltiazem (CARDIZEM CD) 180 MG 24 hr capsule, Take 1 capsule (180 mg total) by mouth daily., Disp: 30 capsule, Rfl: 0 .  metoprolol tartrate (LOPRESSOR) 25 MG tablet, Take 25 mg by mouth 2 (two) times daily., Disp: , Rfl:  .  torsemide (DEMADEX) 20 MG tablet, Take 20 mg by mouth daily., Disp: , Rfl:  .  albuterol (VENTOLIN HFA) 108 (90 Base) MCG/ACT inhaler, Inhale 2 puffs into the lungs every 6 (six) hours as needed for wheezing or shortness of breath. (Patient not taking: Reported on 08/10/2020), Disp: 18 g, Rfl: 0 .  carvedilol (COREG) 6.25 MG tablet, Take 6.25 mg by mouth 2 (two) times daily with a  meal. (Patient not taking: Reported on 08/10/2020), Disp: , Rfl:  .  cyanocobalamin 1000 MCG tablet, Take 1,000 mcg by mouth daily. (Patient not taking: Reported on 08/10/2020), Disp: , Rfl:  .  diltiazem (CARTIA XT) 240 MG 24 hr capsule, Take 1 capsule (240 mg total) by mouth daily. (Patient not taking: Reported on 08/10/2020), Disp: 30 capsule, Rfl: 11 .  Ferrous Sulfate (IRON) 325 (65 Fe) MG TABS, Take by mouth. (Patient not taking: No sig reported), Disp: , Rfl:  .  furosemide (LASIX) 20 MG tablet, Take 1 tablet (20  mg total) by mouth 2 (two) times daily. (Patient not taking: Reported on 08/10/2020), Disp: 60 tablet, Rfl: 0 .  omeprazole (PRILOSEC) 20 MG capsule, Take 20 mg by mouth daily. (Patient not taking: Reported on 08/10/2020), Disp: , Rfl:    Family History  Problem Relation Age of Onset  . Heart disease Mother   . Goiter Mother   . Bladder Cancer Father 46     Social History   Tobacco Use  . Smoking status: Never Smoker  . Smokeless tobacco: Never Used  Vaping Use  . Vaping Use: Never used  Substance Use Topics  . Alcohol use: No  . Drug use: No    Allergies as of 08/10/2020  . (No Known Allergies)    Review of Systems:    All systems reviewed and negative except where noted in HPI.   Physical Exam:  BP 98/60 (BP Location: Right Arm, Patient Position: Sitting, Cuff Size: Normal)   Pulse (!) 111   Ht 5\' 9"  (1.753 m)   Wt 186 lb 9.6 oz (84.6 kg)   BMI 27.56 kg/m  No LMP recorded. Patient is postmenopausal.  General:   Alert,  Well-developed, well-nourished, pleasant and cooperative in NAD Head:  Normocephalic and atraumatic. Eyes:  Sclera clear, no icterus.   Conjunctiva pink. Ears:  Normal auditory acuity. Nose:  No deformity, discharge, or lesions. Mouth:  No deformity or lesions,oropharynx pink & moist. Neck:  Supple; no masses or thyromegaly. Lungs:  Respirations even and unlabored.  Clear throughout to auscultation.   No wheezes, crackles, or rhonchi. No acute distress. Heart:  Regular rate and rhythm; no murmurs, clicks, rubs, or gallops. Abdomen:  Normal bowel sounds. Soft, non-tender and non-distended without masses, hepatosplenomegaly or hernias noted.  No guarding or rebound tenderness.   Rectal: Not performed Msk:  Symmetrical without gross deformities. Good, equal movement & strength bilaterally. Pulses:  Normal pulses noted. Extremities:  No clubbing or edema.  No cyanosis. Neurologic:  Alert and oriented x3;  grossly normal neurologically. Skin:  Intact  without significant lesions or rashes. No jaundice. Lymph Nodes:  No significant cervical adenopathy. Psych:  Alert and cooperative. Normal mood and affect.  Imaging Studies: Reviewed  Assessment and Plan:   Haylynn Pha is a 86 y.o. pleasant Caucasian female with history of chronic kidney disease, anemia of chronic kidney disease currently maintained on erythropoietin, new onset of A. fib with RVR 07/2020, moderate aortic stenosis, moderate tricuspid regurgitation with no evidence of myocardial infarction or CHF  Chronic anemia Her anemia is secondary to anemia of chronic kidney disease Iron studies do not reveal iron deficiency Patient does not demonstrate any signs or symptoms concerning for GI bleed Therefore, I do not recommend GI evaluation at this time Okay to start anticoagulation from GI standpoint Patient is agreeable with my recommendations and she will follow up with me as needed   Follow up as needed  Cephas Darby, MD

## 2020-08-11 ENCOUNTER — Other Ambulatory Visit: Payer: Self-pay

## 2020-08-11 ENCOUNTER — Ambulatory Visit: Payer: Medicare Other | Attending: Family | Admitting: Family

## 2020-08-11 ENCOUNTER — Encounter: Payer: Self-pay | Admitting: Family

## 2020-08-11 VITALS — BP 116/67 | HR 79 | Resp 16 | Ht 68.0 in | Wt 187.4 lb

## 2020-08-11 DIAGNOSIS — I272 Pulmonary hypertension, unspecified: Secondary | ICD-10-CM | POA: Insufficient documentation

## 2020-08-11 DIAGNOSIS — I2510 Atherosclerotic heart disease of native coronary artery without angina pectoris: Secondary | ICD-10-CM | POA: Diagnosis not present

## 2020-08-11 DIAGNOSIS — E785 Hyperlipidemia, unspecified: Secondary | ICD-10-CM | POA: Insufficient documentation

## 2020-08-11 DIAGNOSIS — I4821 Permanent atrial fibrillation: Secondary | ICD-10-CM | POA: Insufficient documentation

## 2020-08-11 DIAGNOSIS — I130 Hypertensive heart and chronic kidney disease with heart failure and stage 1 through stage 4 chronic kidney disease, or unspecified chronic kidney disease: Secondary | ICD-10-CM | POA: Diagnosis not present

## 2020-08-11 DIAGNOSIS — I5033 Acute on chronic diastolic (congestive) heart failure: Secondary | ICD-10-CM | POA: Insufficient documentation

## 2020-08-11 DIAGNOSIS — I10 Essential (primary) hypertension: Secondary | ICD-10-CM

## 2020-08-11 DIAGNOSIS — D638 Anemia in other chronic diseases classified elsewhere: Secondary | ICD-10-CM

## 2020-08-11 DIAGNOSIS — Z79899 Other long term (current) drug therapy: Secondary | ICD-10-CM | POA: Insufficient documentation

## 2020-08-11 DIAGNOSIS — D649 Anemia, unspecified: Secondary | ICD-10-CM | POA: Insufficient documentation

## 2020-08-11 DIAGNOSIS — N184 Chronic kidney disease, stage 4 (severe): Secondary | ICD-10-CM | POA: Insufficient documentation

## 2020-08-11 DIAGNOSIS — I5032 Chronic diastolic (congestive) heart failure: Secondary | ICD-10-CM

## 2020-08-11 NOTE — Patient Instructions (Addendum)
Begin weighing daily and call for an overnight weight gain of > 2 pounds or a weekly weight gain of >5 pounds. 

## 2020-08-11 NOTE — Progress Notes (Signed)
Patient ID: Felicia [name], female    DOB: [birth year], 86 y.o.   MRN: [id number]  HPI  Felicia [name] is a 86 y/o female with a history of CAD, hyperlipidemia, HTN, CKD, vertigo, GERD, HOH, atrial fibrillation, pulmonary HTN, anemia, PVD and chronic heart failure.   Echo report from 07/18/20 reviewed and showed an EF of 55-60% along with mild LVH, moderate LAE, moderate TR, moderate AS and mild/moderate MR.  Was in the ED 08/01/20 due to shortness of breath and leg swelling. Initially given IV diltiazem due to AF with RVR and then transitioned to oral medications. Symptoms and HR improved and she was released. Admitted 07/18/20 due to acute on chronic HF. Cardiology consult obtained. Needed oxygen due to sat of 87%. Initially given IV lasix with transition to oral diuretics. ReDS vest reading was 29%. Not anticoagulated due to anemia. PT consult done. Discharged after 6 days.   She presents today for her initial visit with a chief complaint of moderate fatigue upon minimal exertion. She describes this as having been present for several months. She has associated shortness of breath and depression along with this. She denies any difficulty sleeping, abdominal distention, palpitations, pedal edema, chest pain, cough or dizziness.   Has scales at home but hasn't been weighing herself every day. Intermittent tearful in the office because she wants to regain her independence and doesn't understand why she feels the way she feels.   Past Medical History:  Diagnosis Date  . Anemia   . Atrial fibrillation ([place])   . CHF (congestive heart failure) ([place])   . CKD (chronic kidney disease), stage IV ([place])   . Coronary artery disease   . GERD (gastroesophageal reflux disease)   . HOH (hard of hearing)   . Hyperlipidemia   . Hypertension   . LVH (left ventricular hypertrophy) due to hypertensive disease   . Pneumonia   . Pulmonary hypertension ([place])   . PVD (peripheral vascular disease) ([place])    . Renal disorder   . Vertigo    Past Surgical History:  Procedure Laterality Date  . APPENDECTOMY  1939  . TONSILLECTOMY  1939   Family History  Problem Relation Age of Onset  . Heart disease Mother   . Goiter Mother   . Bladder Cancer Father 68   Social History   Tobacco Use  . Smoking status: Never Smoker  . Smokeless tobacco: Never Used  Substance Use Topics  . Alcohol use: No   No Known Allergies Prior to Admission medications   Medication Sig Start Date End Date Taking? Authorizing Provider  diltiazem (TIAZAC) 180 MG 24 hr capsule Take 180 mg by mouth daily.   Yes [provider]  metoprolol tartrate (LOPRESSOR) 25 MG tablet Take 25 mg by mouth 2 (two) times daily. 08/02/20  Yes [provider]  torsemide (DEMADEX) 20 MG tablet Take 20 mg by mouth daily. 08/02/20  Yes [provider]  albuterol (VENTOLIN HFA) 108 (90 Base) MCG/ACT inhaler Inhale 2 puffs into the lungs every 6 (six) hours as needed for wheezing or shortness of breath. Patient not taking: No sig reported 07/24/20   Loletha Grayer, MD  carvedilol (COREG) 6.25 MG tablet Take 6.25 mg by mouth 2 (two) times daily with a meal. Patient not taking: No sig reported    [provider]  cyanocobalamin 1000 MCG tablet Take 1,000 mcg by mouth daily. Patient not taking: No sig reported    [provider]  Ferrous Sulfate (IRON)  325 (65 Fe) MG TABS Take by mouth. Patient not taking: No sig reported    [provider]    Review of Systems  Constitutional: Positive for fatigue (tire easily). Negative for appetite change.  HENT: Positive for hearing loss and rhinorrhea. Negative for congestion and sore throat.   Eyes: Negative.   Respiratory: Positive for shortness of breath. Negative for cough.   Cardiovascular: Negative for chest pain, palpitations and leg swelling.  Gastrointestinal: Negative for abdominal distention and abdominal pain.  Endocrine: Negative.    Genitourinary: Negative.   Musculoskeletal: Negative for back pain and neck pain.  Skin: Negative.   Allergic/Immunologic: Negative.   Neurological: Negative for dizziness and light-headedness.  Hematological: Negative for adenopathy. Does not bruise/bleed easily.  Psychiatric/Behavioral: Positive for dysphoric mood. Negative for sleep disturbance (sleeping in recliner). The patient is not nervous/anxious.    Vitals:   08/11/20 1449  BP: 116/67  Pulse: 79  Resp: 16  SpO2: 95%  Weight: 187 lb 6 oz (85 kg)  Height: 5\' 8"  (1.727 m)   Wt Readings from Last 3 Encounters:  08/11/20 187 lb 6 oz (85 kg)  08/10/20 186 lb 9.6 oz (84.6 kg)  08/01/20 190 lb 3.2 oz (86.3 kg)   Lab Results  Component Value Date   CREATININE 1.90 (H) 08/01/2020   CREATININE 1.82 (H) 07/24/2020   CREATININE 2.15 (H) 07/22/2020    Physical Exam Vitals and nursing note reviewed.  Constitutional:      Appearance: Normal appearance.  HENT:     Head: Normocephalic and atraumatic.     Right Ear: Decreased hearing noted.     Left Ear: Decreased hearing noted.  Cardiovascular:     Rate and Rhythm: Normal rate. Rhythm irregular.  Pulmonary:     Effort: Pulmonary effort is normal. No respiratory distress.     Breath sounds: No wheezing or rales.  Abdominal:     General: There is no distension.     Palpations: Abdomen is soft.     Tenderness: There is no abdominal tenderness.  Musculoskeletal:        General: No tenderness.     Cervical back: Normal range of motion and neck supple.     Right lower leg: No edema.     Left lower leg: No edema.  Skin:    General: Skin is dry.  Neurological:     General: No focal deficit present.     Mental Status: She is alert and oriented to person, place, and time.  Psychiatric:        Mood and Affect: Mood normal.        Behavior: Behavior normal.    Assessment & Plan:   1: Chronic heart failure with preserved ejection fraction along with structural changes  (LVH)- - NYHA class III - euvolemic today - not weighing daily but does have scales; instructed to weigh every morning, write the weight down and call for an overnight weight gain of >2 pounds or a weekly weight gain of >5 pounds - not adding salt and tries to follow a low sodium diet - discussed what HF is and patient expresses appreciation of the information and says that she feels better about outlook - does elevate her legs when sitting for long periods of time - BNP 08/01/20 was 616.3 - does not get the flu vaccine - current BP may not allow for initiation of entresto  2: HTN- - BP looks good today - saw PCP (Feldpausch) 06/28/20 - BMP  08/01/20 reviewed and showed sodium 140, potassium 4.6, creatinine 1.9 and GFR 25  3: Permanent atrial fibrillation- - saw cardiology Nehemiah Massed) 08/08/20 - currently rate controlled  4: Anemia- - saw GI (Vanga) 08/10/20 - iron infusion done 08/07/20 - Hg on 08/07/20 was 7.9   Medication bottles reviewed.   Return in 6 weeks or sooner for any questions/problems before then.

## 2020-08-17 ENCOUNTER — Inpatient Hospital Stay: Payer: Medicare Other

## 2020-08-21 ENCOUNTER — Inpatient Hospital Stay: Payer: Medicare Other

## 2020-08-21 ENCOUNTER — Other Ambulatory Visit: Payer: Self-pay

## 2020-08-21 VITALS — BP 118/60 | HR 74 | Resp 18

## 2020-08-21 DIAGNOSIS — D638 Anemia in other chronic diseases classified elsewhere: Secondary | ICD-10-CM

## 2020-08-21 DIAGNOSIS — D631 Anemia in chronic kidney disease: Secondary | ICD-10-CM

## 2020-08-21 DIAGNOSIS — N184 Chronic kidney disease, stage 4 (severe): Secondary | ICD-10-CM | POA: Diagnosis not present

## 2020-08-21 LAB — HEMOGLOBIN AND HEMATOCRIT, BLOOD
HCT: 25.7 % — ABNORMAL LOW (ref 36.0–46.0)
Hemoglobin: 8 g/dL — ABNORMAL LOW (ref 12.0–15.0)

## 2020-08-21 MED ORDER — EPOETIN ALFA-EPBX 10000 UNIT/ML IJ SOLN
20000.0000 [IU] | INTRAMUSCULAR | Status: DC
  Administered 2020-08-21: 20000 [IU] via SUBCUTANEOUS

## 2020-08-31 ENCOUNTER — Inpatient Hospital Stay: Payer: Medicare Other

## 2020-09-04 ENCOUNTER — Inpatient Hospital Stay: Payer: Medicare Other | Attending: Hematology and Oncology

## 2020-09-04 ENCOUNTER — Inpatient Hospital Stay: Payer: Medicare Other

## 2020-09-04 ENCOUNTER — Other Ambulatory Visit: Payer: Self-pay

## 2020-09-04 VITALS — BP 119/86

## 2020-09-04 DIAGNOSIS — N184 Chronic kidney disease, stage 4 (severe): Secondary | ICD-10-CM | POA: Insufficient documentation

## 2020-09-04 DIAGNOSIS — D638 Anemia in other chronic diseases classified elsewhere: Secondary | ICD-10-CM

## 2020-09-04 DIAGNOSIS — D631 Anemia in chronic kidney disease: Secondary | ICD-10-CM | POA: Diagnosis present

## 2020-09-04 LAB — HEMOGLOBIN AND HEMATOCRIT, BLOOD
HCT: 27.1 % — ABNORMAL LOW (ref 36.0–46.0)
Hemoglobin: 8.2 g/dL — ABNORMAL LOW (ref 12.0–15.0)

## 2020-09-04 MED ORDER — EPOETIN ALFA-EPBX 10000 UNIT/ML IJ SOLN
20000.0000 [IU] | INTRAMUSCULAR | Status: DC
  Administered 2020-09-04: 20000 [IU] via SUBCUTANEOUS
  Filled 2020-09-04: qty 2

## 2020-09-18 ENCOUNTER — Inpatient Hospital Stay: Payer: Medicare Other

## 2020-09-19 ENCOUNTER — Telehealth: Payer: Self-pay | Admitting: Hematology and Oncology

## 2020-09-19 NOTE — Telephone Encounter (Signed)
Spoke to pt. for her clarification on upcoming appt. scheduled for 09/25/20. Gave her appt. time and date. I asked her about transportation but she stated she will call them herself.

## 2020-09-22 ENCOUNTER — Ambulatory Visit: Payer: Medicare Other | Admitting: Family

## 2020-09-24 ENCOUNTER — Inpatient Hospital Stay
Admission: EM | Admit: 2020-09-24 | Discharge: 2020-09-28 | DRG: 291 | Disposition: A | Discharge status: Skilled Nursing Facility | Payer: Medicare Other | Attending: Internal Medicine | Admitting: Internal Medicine

## 2020-09-24 ENCOUNTER — Emergency Department: Payer: Medicare Other

## 2020-09-24 ENCOUNTER — Other Ambulatory Visit: Payer: Self-pay

## 2020-09-24 ENCOUNTER — Encounter: Payer: Self-pay | Admitting: *Deleted

## 2020-09-24 DIAGNOSIS — E785 Hyperlipidemia, unspecified: Secondary | ICD-10-CM | POA: Diagnosis present

## 2020-09-24 DIAGNOSIS — I10 Essential (primary) hypertension: Secondary | ICD-10-CM | POA: Diagnosis present

## 2020-09-24 DIAGNOSIS — Z9981 Dependence on supplemental oxygen: Secondary | ICD-10-CM

## 2020-09-24 DIAGNOSIS — J9611 Chronic respiratory failure with hypoxia: Secondary | ICD-10-CM | POA: Diagnosis present

## 2020-09-24 DIAGNOSIS — I130 Hypertensive heart and chronic kidney disease with heart failure and stage 1 through stage 4 chronic kidney disease, or unspecified chronic kidney disease: Secondary | ICD-10-CM | POA: Diagnosis not present

## 2020-09-24 DIAGNOSIS — I4891 Unspecified atrial fibrillation: Secondary | ICD-10-CM

## 2020-09-24 DIAGNOSIS — I739 Peripheral vascular disease, unspecified: Secondary | ICD-10-CM | POA: Diagnosis present

## 2020-09-24 DIAGNOSIS — Z66 Do not resuscitate: Secondary | ICD-10-CM | POA: Diagnosis present

## 2020-09-24 DIAGNOSIS — Z8052 Family history of malignant neoplasm of bladder: Secondary | ICD-10-CM

## 2020-09-24 DIAGNOSIS — I509 Heart failure, unspecified: Secondary | ICD-10-CM

## 2020-09-24 DIAGNOSIS — Z79899 Other long term (current) drug therapy: Secondary | ICD-10-CM

## 2020-09-24 DIAGNOSIS — I350 Nonrheumatic aortic (valve) stenosis: Secondary | ICD-10-CM | POA: Diagnosis present

## 2020-09-24 DIAGNOSIS — H9190 Unspecified hearing loss, unspecified ear: Secondary | ICD-10-CM | POA: Diagnosis present

## 2020-09-24 DIAGNOSIS — R54 Age-related physical debility: Secondary | ICD-10-CM | POA: Diagnosis present

## 2020-09-24 DIAGNOSIS — D631 Anemia in chronic kidney disease: Secondary | ICD-10-CM | POA: Diagnosis present

## 2020-09-24 DIAGNOSIS — N189 Chronic kidney disease, unspecified: Secondary | ICD-10-CM | POA: Diagnosis present

## 2020-09-24 DIAGNOSIS — I5033 Acute on chronic diastolic (congestive) heart failure: Secondary | ICD-10-CM | POA: Diagnosis present

## 2020-09-24 DIAGNOSIS — Z20822 Contact with and (suspected) exposure to covid-19: Secondary | ICD-10-CM | POA: Diagnosis present

## 2020-09-24 DIAGNOSIS — Z9114 Patient's other noncompliance with medication regimen: Secondary | ICD-10-CM

## 2020-09-24 DIAGNOSIS — K219 Gastro-esophageal reflux disease without esophagitis: Secondary | ICD-10-CM | POA: Diagnosis present

## 2020-09-24 DIAGNOSIS — R778 Other specified abnormalities of plasma proteins: Secondary | ICD-10-CM

## 2020-09-24 DIAGNOSIS — Z8249 Family history of ischemic heart disease and other diseases of the circulatory system: Secondary | ICD-10-CM

## 2020-09-24 DIAGNOSIS — D696 Thrombocytopenia, unspecified: Secondary | ICD-10-CM | POA: Diagnosis present

## 2020-09-24 DIAGNOSIS — N184 Chronic kidney disease, stage 4 (severe): Secondary | ICD-10-CM | POA: Diagnosis present

## 2020-09-24 DIAGNOSIS — I4821 Permanent atrial fibrillation: Secondary | ICD-10-CM | POA: Diagnosis present

## 2020-09-24 DIAGNOSIS — D638 Anemia in other chronic diseases classified elsewhere: Secondary | ICD-10-CM | POA: Diagnosis present

## 2020-09-24 DIAGNOSIS — I2510 Atherosclerotic heart disease of native coronary artery without angina pectoris: Secondary | ICD-10-CM | POA: Diagnosis present

## 2020-09-24 DIAGNOSIS — N179 Acute kidney failure, unspecified: Secondary | ICD-10-CM | POA: Diagnosis present

## 2020-09-24 LAB — CBC
HCT: 28.3 % — ABNORMAL LOW (ref 36.0–46.0)
Hemoglobin: 8.7 g/dL — ABNORMAL LOW (ref 12.0–15.0)
MCH: 30.1 pg (ref 26.0–34.0)
MCHC: 30.7 g/dL (ref 30.0–36.0)
MCV: 97.9 fL (ref 80.0–100.0)
Platelets: 131 10*3/uL — ABNORMAL LOW (ref 150–400)
RBC: 2.89 MIL/uL — ABNORMAL LOW (ref 3.87–5.11)
RDW: 17 % — ABNORMAL HIGH (ref 11.5–15.5)
WBC: 4.8 10*3/uL (ref 4.0–10.5)
nRBC: 0 % (ref 0.0–0.2)

## 2020-09-24 LAB — BASIC METABOLIC PANEL
Anion gap: 10 (ref 5–15)
BUN: 59 mg/dL — ABNORMAL HIGH (ref 8–23)
CO2: 25 mmol/L (ref 22–32)
Calcium: 9.3 mg/dL (ref 8.9–10.3)
Chloride: 106 mmol/L (ref 98–111)
Creatinine, Ser: 2.78 mg/dL — ABNORMAL HIGH (ref 0.44–1.00)
GFR, Estimated: 16 mL/min — ABNORMAL LOW (ref >60)
Glucose, Bld: 96 mg/dL (ref 70–99)
Potassium: 5.2 mmol/L — ABNORMAL HIGH (ref 3.5–5.1)
Sodium: 141 mmol/L (ref 135–145)

## 2020-09-24 LAB — TROPONIN I (HIGH SENSITIVITY): Troponin I (High Sensitivity): 44 ng/L — ABNORMAL HIGH (ref <18)

## 2020-09-24 NOTE — ED Notes (Signed)
First nurse note:   Pt presents to lobby via EMS from home where she lives by herself. Pt has not been taking cardiac medications because she thought she was out. Pt is not out of medications and per son has been more forgetful than normal. Pt reporting weakness with increased difficulty ambulating around the house.   Hx of Afib, CHF  EMS vitals:  122/82 HR 30-90 bpm 94% 2L (chronic) with exp wheeze RLL 18RR Etco2 38 Afebrile  CBG 105

## 2020-09-24 NOTE — ED Triage Notes (Signed)
Pt c/o worsening weakness and shortness of breath. Pt has not taken her usual home meds x 3 weeks. Pt thought she was out of medication and EMS found the meds at her home. Pt is confused about new medication regimen that started w/ most recent admission. Pt lives alone and has been having worsening forgetfulness based on this recent evidence of not taking meds that she had in her home. Pt wears O2@ 2L/min/[place] chronically at home. Pt is presently in atrial fibrillation.

## 2020-09-24 NOTE — ED Notes (Signed)
RN called lab to check on BMP and Troponin results. Lab reports equipment difficulties but they will result as soon as possible.

## 2020-09-25 ENCOUNTER — Inpatient Hospital Stay: Payer: Medicare Other

## 2020-09-25 DIAGNOSIS — I10 Essential (primary) hypertension: Secondary | ICD-10-CM

## 2020-09-25 DIAGNOSIS — I4821 Permanent atrial fibrillation: Secondary | ICD-10-CM | POA: Diagnosis not present

## 2020-09-25 DIAGNOSIS — N189 Chronic kidney disease, unspecified: Secondary | ICD-10-CM

## 2020-09-25 DIAGNOSIS — N179 Acute kidney failure, unspecified: Secondary | ICD-10-CM

## 2020-09-25 DIAGNOSIS — I5033 Acute on chronic diastolic (congestive) heart failure: Secondary | ICD-10-CM

## 2020-09-25 DIAGNOSIS — J9611 Chronic respiratory failure with hypoxia: Secondary | ICD-10-CM

## 2020-09-25 LAB — CBC
HCT: 26.2 % — ABNORMAL LOW (ref 36.0–46.0)
Hemoglobin: 7.7 g/dL — ABNORMAL LOW (ref 12.0–15.0)
MCH: 29.2 pg (ref 26.0–34.0)
MCHC: 29.4 g/dL — ABNORMAL LOW (ref 30.0–36.0)
MCV: 99.2 fL (ref 80.0–100.0)
Platelets: 113 10*3/uL — ABNORMAL LOW (ref 150–400)
RBC: 2.64 MIL/uL — ABNORMAL LOW (ref 3.87–5.11)
RDW: 17 % — ABNORMAL HIGH (ref 11.5–15.5)
WBC: 4.9 10*3/uL (ref 4.0–10.5)
nRBC: 0.4 % — ABNORMAL HIGH (ref 0.0–0.2)

## 2020-09-25 LAB — BASIC METABOLIC PANEL
Anion gap: 11 (ref 5–15)
BUN: 62 mg/dL — ABNORMAL HIGH (ref 8–23)
CO2: 26 mmol/L (ref 22–32)
Calcium: 9 mg/dL (ref 8.9–10.3)
Chloride: 107 mmol/L (ref 98–111)
Creatinine, Ser: 2.81 mg/dL — ABNORMAL HIGH (ref 0.44–1.00)
GFR, Estimated: 16 mL/min — ABNORMAL LOW (ref >60)
Glucose, Bld: 85 mg/dL (ref 70–99)
Potassium: 5.2 mmol/L — ABNORMAL HIGH (ref 3.5–5.1)
Sodium: 144 mmol/L (ref 135–145)

## 2020-09-25 LAB — HEPATIC FUNCTION PANEL
ALT: 6 U/L (ref 0–44)
AST: 16 U/L (ref 15–41)
Albumin: 3.7 g/dL (ref 3.5–5.0)
Alkaline Phosphatase: 43 U/L (ref 38–126)
Bilirubin, Direct: 0.2 mg/dL (ref 0.0–0.2)
Indirect Bilirubin: 0.8 mg/dL (ref 0.3–0.9)
Total Bilirubin: 1 mg/dL (ref 0.3–1.2)
Total Protein: 7.2 g/dL (ref 6.5–8.1)

## 2020-09-25 LAB — BRAIN NATRIURETIC PEPTIDE: B Natriuretic Peptide: 813.9 pg/mL — ABNORMAL HIGH (ref 0.0–100.0)

## 2020-09-25 LAB — TROPONIN I (HIGH SENSITIVITY)
Troponin I (High Sensitivity): 49 ng/L — ABNORMAL HIGH (ref <18)
Troponin I (High Sensitivity): 50 ng/L — ABNORMAL HIGH (ref <18)
Troponin I (High Sensitivity): 51 ng/L — ABNORMAL HIGH (ref <18)

## 2020-09-25 LAB — SARS CORONAVIRUS 2 BY RT PCR (HOSPITAL ORDER, PERFORMED IN ~~LOC~~ HOSPITAL LAB): SARS Coronavirus 2: NEGATIVE

## 2020-09-25 LAB — MAGNESIUM: Magnesium: 2.3 mg/dL (ref 1.7–2.4)

## 2020-09-25 LAB — LIPASE, BLOOD: Lipase: 50 U/L (ref 11–51)

## 2020-09-25 MED ORDER — SODIUM CHLORIDE 0.9% FLUSH
3.0000 mL | Freq: Two times a day (BID) | INTRAVENOUS | Status: DC
  Administered 2020-09-25 – 2020-09-28 (×7): 3 mL via INTRAVENOUS

## 2020-09-25 MED ORDER — DILTIAZEM HCL ER COATED BEADS 180 MG PO TB24
180.0000 mg | ORAL_TABLET | Freq: Every day | ORAL | Status: DC
  Filled 2020-09-25 (×2): qty 1

## 2020-09-25 MED ORDER — DILTIAZEM HCL ER COATED BEADS 180 MG PO TB24
180.0000 mg | ORAL_TABLET | Freq: Every day | ORAL | Status: DC
  Filled 2020-09-25: qty 1

## 2020-09-25 MED ORDER — ACETAMINOPHEN 325 MG PO TABS: 650.0000 mg | ORAL_TABLET | Freq: Four times a day (QID) | ORAL | Status: DC | PRN

## 2020-09-25 MED ORDER — ONDANSETRON HCL 4 MG/2ML IJ SOLN: 4.0000 mg | Freq: Four times a day (QID) | INTRAMUSCULAR | Status: DC | PRN

## 2020-09-25 MED ORDER — DILTIAZEM HCL 25 MG/5ML IV SOLN
10.0000 mg | INTRAVENOUS | Status: AC
  Administered 2020-09-25: 10 mg via INTRAVENOUS
  Filled 2020-09-25: qty 5

## 2020-09-25 MED ORDER — FUROSEMIDE 10 MG/ML IJ SOLN
20.0000 mg | Freq: Two times a day (BID) | INTRAMUSCULAR | Status: DC
  Administered 2020-09-25 – 2020-09-26 (×4): 20 mg via INTRAVENOUS
  Filled 2020-09-25 (×2): qty 4
  Filled 2020-09-25: qty 2
  Filled 2020-09-25: qty 4

## 2020-09-25 MED ORDER — FUROSEMIDE 10 MG/ML IJ SOLN
20.0000 mg | Freq: Once | INTRAMUSCULAR | Status: AC
  Administered 2020-09-25: 20 mg via INTRAVENOUS
  Filled 2020-09-25: qty 4

## 2020-09-25 MED ORDER — DILTIAZEM HCL ER 90 MG PO CP12
90.0000 mg | ORAL_CAPSULE | Freq: Two times a day (BID) | ORAL | Status: DC
  Administered 2020-09-25: 90 mg via ORAL
  Filled 2020-09-25 (×2): qty 1

## 2020-09-25 MED ORDER — ACETAMINOPHEN 650 MG RE SUPP: 650.0000 mg | Freq: Four times a day (QID) | RECTAL | Status: DC | PRN

## 2020-09-25 MED ORDER — DILTIAZEM HCL 25 MG/5ML IV SOLN
10.0000 mg | Freq: Once | INTRAVENOUS | Status: AC
  Administered 2020-09-25: 10 mg via INTRAVENOUS

## 2020-09-25 MED ORDER — ONDANSETRON HCL 4 MG PO TABS: 4.0000 mg | ORAL_TABLET | Freq: Four times a day (QID) | ORAL | Status: DC | PRN

## 2020-09-25 MED ORDER — METOPROLOL TARTRATE 25 MG PO TABS
25.0000 mg | ORAL_TABLET | Freq: Two times a day (BID) | ORAL | Status: DC
  Administered 2020-09-25 – 2020-09-28 (×6): 25 mg via ORAL
  Filled 2020-09-25 (×6): qty 1

## 2020-09-25 MED ORDER — DILTIAZEM HCL ER COATED BEADS 180 MG PO CP24
180.0000 mg | ORAL_CAPSULE | Freq: Every day | ORAL | Status: DC
  Administered 2020-09-25 – 2020-09-27 (×3): 180 mg via ORAL
  Filled 2020-09-25 (×4): qty 1

## 2020-09-25 MED ORDER — HEPARIN SODIUM (PORCINE) 5000 UNIT/ML IJ SOLN
5000.0000 [IU] | Freq: Three times a day (TID) | INTRAMUSCULAR | Status: DC
  Administered 2020-09-25 – 2020-09-28 (×11): 5000 [IU] via SUBCUTANEOUS
  Filled 2020-09-25 (×11): qty 1

## 2020-09-25 NOTE — ED Provider Notes (Signed)
[facility] Emergency Department Provider Note  ____________________________________________   Event Date/Time   First MD Initiated Contact with Patient 09/24/20 2327     (approximate)  I have reviewed the triage vital signs and the nursing notes.   HISTORY  Chief Complaint Weakness (Pt has not taken meds x 3 wks because she thought she was out. )    HPI Felicia [name] is a 87 y.o. female with medical history as listed below who presents for evaluation of a variety of concerns including general malaise, shortness of breath, and  generalized weakness.  Apparently the patient has been confused about the presence or absence of her medications at home and has not been taking her medications for the last 3 weeks, except for possibly her furosemide (she has been to be taking both furosemide and torsemide).  Her son checked on her tonight and she is quite short of breath.  She said that she has been feeling increasingly short of breath over the last several days; even though she uses 2 L of oxygen at baseline, it has not been helping.  She feels more short of breath if she lies flat or has any amount of exertion.  She has not noticed any particular swelling in her legs.  She denies chest pain but has been somewhat nauseated.  She denies fever, sore throat, and changes of smell and taste.  She denies abdominal pain.  She describes the symptoms as severe, particular shortness of breath, nothing in particular makes her symptoms better.        Past Medical History:  Diagnosis Date  . Anemia   . Atrial fibrillation ([place])   . CHF (congestive heart failure) ([place])   . CKD (chronic kidney disease), stage IV ([place])   . Coronary artery disease   . GERD (gastroesophageal reflux disease)   . HOH (hard of hearing)   . Hyperlipidemia   . Hypertension   . LVH (left ventricular hypertrophy) due to hypertensive disease   . Pneumonia   . Pulmonary hypertension ([place])   .  PVD (peripheral vascular disease) ([place])   . Renal disorder   . Vertigo     Patient Active Problem List   Diagnosis Date Noted  . Permanent atrial fibrillation with rapid ventricular response ([place]) 09/25/2020  . Acute kidney injury superimposed on CKD ([place]) 09/25/2020  . Dyspnea 08/06/2020  . Atrial fibrillation, chronic ([place])   . Acute on chronic diastolic CHF (congestive heart failure) ([place])   . Permanent atrial fibrillation ([place])   . Acquired thrombophilia ([place])   . Weakness   . Respiratory failure with hypoxia ([place]) 07/18/2020  . Atrial fibrillation with RVR ([place]) 07/18/2020  . Monocytosis 10/03/2019  . Normocytic anemia 09/17/2019  . Hyperlipidemia, mixed 09/17/2019  . Peripheral vascular disease ([place]) 09/17/2019  . Anemia of chronic disease 09/17/2019  . ANA positive 09/01/2019  . Flat feet, bilateral 09/01/2019  . CKD (chronic kidney disease), stage IV ([place]) 09/01/2019  . Primary osteoarthritis of hand 09/01/2019  . Dizziness 02/24/2019  . LVH (left ventricular hypertrophy) due to hypertensive disease, without heart failure 01/28/2018  . HCAP (healthcare-associated pneumonia) 09/24/2016  . Sepsis ([place]) 09/12/2016  . Community acquired pneumonia 09/12/2016  . Hypoxia 09/12/2016  . Pleuritic chest pain 09/12/2016  . Pneumonia 09/12/2016  . Essential hypertension 01/05/2015  . Mild aortic valve stenosis 10/20/2014  . Moderate mitral insufficiency 10/20/2014  . Moderate tricuspid insufficiency 10/20/2014  . Chronic pulmonary hypertension ([place]) 09/22/2014  . Venous  insufficiency of both lower extremities 09/22/2014  . CTS (carpal tunnel syndrome) 03/21/2014    Past Surgical History:  Procedure Laterality Date  . APPENDECTOMY  1939  . TONSILLECTOMY  1939    Prior to Admission medications   Medication Sig Start Date End Date Taking? Authorizing Provider  metoprolol tartrate (LOPRESSOR) 25 MG tablet Take 25 mg by mouth 2 (two) times daily. 08/02/20  Yes [provider]  albuterol (VENTOLIN HFA) 108 (90 Base) MCG/ACT inhaler Inhale 2 puffs into the lungs every 6 (six) hours as needed for wheezing or shortness of breath. Patient not taking: No sig reported 07/24/20   Loletha Grayer, MD  carvedilol (COREG) 6.25 MG tablet Take 6.25 mg by mouth 2 (two) times daily with a meal. Patient not taking: No sig reported    [provider]  cyanocobalamin 1000 MCG tablet Take 1,000 mcg by mouth daily. Patient not taking: No sig reported    [provider]  diltiazem (TIAZAC) 180 MG 24 hr capsule Take 180 mg by mouth daily.    [provider]  Ferrous Sulfate (IRON) 325 (65 Fe) MG TABS Take by mouth. Patient not taking: No sig reported    [provider]  torsemide (DEMADEX) 20 MG tablet Take 20 mg by mouth daily. 08/02/20   [provider]    Allergies Patient has no known allergies.  Family History  Problem Relation Age of Onset  . Heart disease Mother   . Goiter Mother   . Bladder Cancer Father 86    Social History Social History   Tobacco Use  . Smoking status: Never Smoker  . Smokeless tobacco: Never Used  Vaping Use  . Vaping Use: Never used  Substance Use Topics  . Alcohol use: No  . Drug use: No    Review of Systems Constitutional: No fever/chills.  Generalized weakness. Eyes: No visual changes. ENT: No sore throat. Cardiovascular: Denies chest pain. Respiratory: Positive for shortness of breath particular with exertion.  Positive for orthopnea. Gastrointestinal: No abdominal pain.  No nausea, no vomiting.  No diarrhea.  No constipation. Genitourinary: Negative for dysuria. Musculoskeletal: Negative for neck pain.  Negative for back pain. Integumentary: Negative for rash. Neurological: Negative for headaches, focal weakness or numbness.   ____________________________________________   PHYSICAL EXAM:  VITAL SIGNS: ED Triage Vitals  Enc Vitals Group     BP 09/24/20 1924  124/76     Pulse Rate 09/24/20 1924  140     Resp 09/24/20 1924 (!) 22     Temp 09/24/20 1924 98.5 F (36.9 C)     Temp Source 09/24/20 1924 Oral     SpO2 09/24/20 1924 100 %     Weight 09/24/20 1925 84.4 kg (186 lb)     Height 09/24/20 1925 1.753 m ('5\' 9"'$ )     Head Circumference --      Peak Flow --      Pain Score 09/24/20 1925 0     Pain Loc --      Pain Edu? --      Excl. in [place]? --     Constitutional: Alert and oriented but with obvious memory deficits. Eyes: Conjunctivae are normal.  Head: Atraumatic. Nose: No congestion/rhinnorhea. Mouth/Throat: Patient is wearing a mask. Neck: No stridor.  No meningeal signs.   Cardiovascular: Tachycardia with irregularly irregular rhythm. Good peripheral circulation. Respiratory: Normal respiratory effort.  No retractions.  Some coarse breath sounds in the bases. Gastrointestinal: Soft and nontender. No distention.  Musculoskeletal: No lower extremity tenderness nor edema. No gross deformities of extremities. Neurologic:  Normal speech and language. No gross focal neurologic deficits are appreciated.  Skin:  Skin is warm, dry and intact. Psychiatric: Mood and affect are normal. Speech and behavior are normal.  ____________________________________________   LABS (all labs ordered are listed, but only abnormal results are displayed)  Labs Reviewed  BASIC METABOLIC PANEL - Abnormal; Notable for the following components:      Result Value   Potassium 5.2 (*)    BUN 59 (*)    Creatinine, Ser 2.78 (*)    GFR, Estimated 16 (*)    All other components within normal limits  CBC - Abnormal; Notable for the following components:   RBC 2.89 (*)    Hemoglobin 8.7 (*)    HCT 28.3 (*)    RDW 17.0 (*)    Platelets 131 (*)    All other components within normal limits  BRAIN NATRIURETIC PEPTIDE - Abnormal; Notable for the following components:   B Natriuretic Peptide 813.9 (*)    All other components within normal limits  TROPONIN I (HIGH  SENSITIVITY) - Abnormal; Notable for the following components:   Troponin I (High Sensitivity) 44 (*)    All other components within normal limits  TROPONIN I (HIGH SENSITIVITY) - Abnormal; Notable for the following components:   Troponin I (High Sensitivity) 50 (*)    All other components within normal limits  SARS CORONAVIRUS 2 BY RT PCR (HOSPITAL ORDER, [facility])  HEPATIC FUNCTION PANEL  LIPASE, BLOOD  URINALYSIS, COMPLETE (UACMP) WITH MICROSCOPIC  MAGNESIUM  BASIC METABOLIC PANEL  CBC  POC SARS CORONAVIRUS 2 AG -  ED  CBG MONITORING, ED   ____________________________________________  EKG  ED ECG REPORT I, Hinda Kehr, the attending physician, personally viewed and interpreted this ECG.  Date: 09/24/2020 EKG Time: 19: 33 Rate: 141 Rhythm: A. fib with RVR QRS Axis: normal Intervals: normal ST/T Wave abnormalities: Non-specific ST segment / T-wave changes, but no clear evidence of acute ischemia. Narrative Interpretation: no definitive evidence of acute ischemia; does not meet STEMI criteria.   ____________________________________________  RADIOLOGY I, Hinda Kehr, personally viewed and evaluated these images (plain radiographs) as part of my medical decision making, as well as reviewing the written report by the radiologist.  ED MD interpretation: Cardiomegaly, pulmonary vascular congestion with perihilar edema, and bilateral pleural effusions.  Official radiology report(s): DG Chest Portable 1 View  Result Date: 09/24/2020 CLINICAL DATA:  Worsening weakness and shortness of breath. EXAM: PORTABLE CHEST 1 VIEW COMPARISON:  08/01/2020 FINDINGS: Cardiac enlargement. Diffuse pulmonary vascular congestion with perihilar infiltration, likely edema. Bilateral pleural effusions with basilar atelectasis, greater on the left. Progression since previous study. Calcified and tortuous aorta. Degenerative changes in the spine and shoulders. IMPRESSION:  Cardiac enlargement, pulmonary vascular congestion, perihilar edema, and bilateral pleural effusions. Electronically Signed   By: Lucienne Capers M.D.   On: 09/24/2020 23:45    ____________________________________________   PROCEDURES   Procedure(s) performed (including Critical Care):  .Critical Care Performed by: Hinda Kehr, MD Authorized by: Hinda Kehr, MD   Critical care provider statement:    Critical care time (minutes):  30   Critical care time was exclusive of:  Separately billable procedures and treating other patients   Critical care was necessary to treat or prevent imminent or life-threatening deterioration of the following conditions:  Cardiac failure (CHF, a-fib w/ RVR)   Critical care was time spent personally  by me on the following activities:  Development of treatment plan with patient or surrogate, discussions with consultants, evaluation of patient's response to treatment, examination of patient, obtaining history from patient or surrogate, ordering and performing treatments and interventions, ordering and review of laboratory studies, ordering and review of radiographic studies, pulse oximetry, re-evaluation of patient's condition and review of old charts .1-3 Lead EKG Interpretation Performed by: Hinda Kehr, MD Authorized by: Hinda Kehr, MD     Interpretation: abnormal     ECG rate:  140   ECG rate assessment: tachycardic     Rhythm: atrial fibrillation     Ectopy: none     Conduction: normal       ____________________________________________   INITIAL IMPRESSION / MDM / ASSESSMENT AND PLAN / ED COURSE  As part of my medical decision making, I reviewed the following data within the [facility] History obtained from family, Nursing notes reviewed and incorporated, Labs reviewed , EKG interpreted , Old EKG reviewed, Old chart reviewed, Radiograph reviewed , Discussed with admitting physician (Dr. Posey Pronto) and Notes from prior ED  visits   Differential diagnosis includes, but is not limited to, medication noncompliance, A. fib with RVR, CHF exacerbation, COVID-19, electrolyte or metabolic abnormality, less likely ACS.  The patient is on the cardiac monitor to evaluate for evidence of arrhythmia and/or significant heart rate changes.  I believe that most likely the patient's issues are stemming from not taking her medications for weeks.  I personally reviewed the patient's imaging and agree with the radiologist's interpretation that she has a degree of heart failure exacerbation with pulmonary edema and some pleural effusions.  BNP is elevated at about 814.  Basic metabolic panel is notable for acute on chronic kidney failure with creatinine of 2.78 which is up about a point from prior.  CBC shows anemia but the hemoglobin of 8.7 seems to be her baseline.  She has an elevated troponin in the 40-50 range.  Covid rapid antigen test is negative, PCR is pending. Of note, the patient is not vaccinated for COVID-19.  The son is in the room and I updated him, confirmed history, and told him the plan.  He understands and agrees with the plan as does the patient.  I ordered diltiazem 10 mg IV given her borderline blood pressure to see if this will help with rate control.  I also ordered furosemide 20 mg IV to begin the diuresis process.      Clinical Course as of 09/25/20 0131  Mon Sep 25, 2020  [id number] Discussed by phone with Dr. Posey Pronto of the hospitalist service.  He will admit. [CF]    Clinical Course User Index [CF] Hinda Kehr, MD     ____________________________________________  FINAL CLINICAL IMPRESSION(S) / ED DIAGNOSES  Final diagnoses:  Atrial fibrillation with rapid ventricular response ([place])  Acute on chronic congestive heart failure, unspecified heart failure type ([place])  Acute renal failure superimposed on chronic kidney disease, unspecified CKD stage, unspecified acute renal failure type ([facility])  Elevated  troponin I level     MEDICATIONS GIVEN DURING THIS VISIT:  Medications  diltiazem (CARDIZEM) injection 10 mg (has no administration in time range)  furosemide (LASIX) injection 20 mg (has no administration in time range)     ED Discharge Orders    None      *Please note:  Felicia [name] was evaluated in Emergency Department on 09/25/2020 for the symptoms described in the history of present illness. She was  evaluated in the context of the global COVID-19 pandemic, which necessitated consideration that the patient might be at risk for infection with the SARS-CoV-2 virus that causes COVID-19. Institutional protocols and algorithms that pertain to the evaluation of patients at risk for COVID-19 are in a state of rapid change based on information released by regulatory bodies including the CDC and federal and state organizations. These policies and algorithms were followed during the patient's care in the ED.  Some ED evaluations and interventions may be delayed as a result of limited staffing during and after the pandemic.*  Note:  This document was prepared using Dragon voice recognition software and may include unintentional dictation errors.   Hinda Kehr, MD 09/25/20 [phone]

## 2020-09-25 NOTE — Progress Notes (Signed)
PROGRESS NOTE    Felicia [name]  [id number] DOB: 09/26/1928 DOA: 09/24/2020 PCP: Sofie Hartigan, MD   Chief Complaint  Patient presents with  . Weakness    Pt has not taken meds x 3 wks because she thought she was out.     Brief Narrative:  HPI per Dr. Kateri Mc is a 87 y.o. female with medical history significant for permanent atrial fibrillation not currently on anticoagulation due to anemia, chronic diastolic CHF, chronic respiratory failure with hypoxia on 2 L of home O2 via [place] at all times, CKD stage IV, moderate aortic stenosis, hypertension, hyperlipidemia, anemia of chronic disease who presents to the ED for evaluation of shortness of breath. Son is at bedside to provide additional history.  Patient stated she has been feeling generally weak and short of breath for few weeks now.  She has had some confusion regarding her home medications and it seems she has not been taking what has been prescribed.  She says she ran out of furosemide due to no refill however looks like she is supposed be on torsemide but it is unclear that she is taking torsemide.  She also says she ran out of carvedilol but looks like she is supposed be on metoprolol.  She reports an associated cough productive of white sputum.  She denies any hemoptysis, subjective fevers, chills, diaphoresis, chest pain, nausea, vomiting, abdominal pain.  She does report having loose bowel movements last 2 days, once per day.  ED Course:  Initial vitals showed BP 124/76, pulse 140, RR 15, temp 98.5 F, SPO2 100% on room air.  Labs show creatinine 2.78 (previously 1.90 on 08/01/2020), BUN 59, potassium 5.2, sodium 141, bicarb 25, serum glucose 96, WBC 4.8, hemoglobin 8.7, platelets 1 31,000, high-sensitivity troponin I 44, BNP 813.9, lipase 50, LFTs within normal limits.  SARS-CoV-2 PCR test is ordered and pending.  Portable chest x-ray shows cardiac enlargement with pulmonary vascular  congestion, pulmonary edema, and bilateral pleural effusions.  Patient is ordered to receive IV Lasix 20 mg and IV diltiazem 10 mg.  Hospitalist service was consulted to admit for further evaluation and management.  Review of Systems: All systems reviewed and are negative except as documented in history of present illness above.   Assessment & Plan:   Principal Problem:   Permanent atrial fibrillation with rapid ventricular response (HCC) Active Problems:   Essential hypertension   Anemia of chronic disease   Chronic respiratory failure with hypoxia (HCC)   Acute on chronic diastolic CHF (congestive heart failure) (HCC)   Acute kidney injury superimposed on CKD ([place])   1 permanent atrial fibrillation with RVR Likely secondary to confusion about medications.  Patient received a dose of IV diltiazem in the ED and on presentation heart rate improved.  Patient currently in the ED heart rates ranging from the 110s to the 130s.  Patient however has not received oral diltiazem yet as diltiazem CD 180 mg daily and available in pharmacy.  We will have pharmacy place patient on diltiazem 90 mg p.o. twice daily until able to get long-acting diltiazem 100 mg daily.  Continue Lopressor 25 mg twice daily.  Not a anticoagulation candidate secondary to anemia.  Outpatient follow-up.  2.  Acute on chronic diastolic CHF Patient presented worsening shortness of breath, progressive pulmonary edema and pleural effusions noted on chest x-ray.  BNP elevated.  Likely secondary to confusion with medications.  Recent 2D echo done 07/18/2020 with a EF of 55 to  60%.  Patient received a dose of IV Lasix 20 mg in the ED on presentation, urine output not properly recorded.  Placed on Lasix 20 mg IV every 12 hours.  Continue home regimen Lopressor.  Continue Cardizem.  Strict I's and O's.  Daily weights.  Will need outpatient follow-up with cardiology.  3.  Acute kidney injury on chronic kidney disease stage IV Likely  secondary to problem #2.  Patient placed on IV Lasix.  Follow urine output.  Monitor renal function.  Follow.  4.  Chronic respiratory failure with hypoxia On 2 L home O2.  Currently at baseline.  Follow.  5.  Hypertension Continue diltiazem and Lopressor.  6.  Anemia of chronic disease Patient with no overt bleeding.  H&H stable.  Transfusion threshold hemoglobin < 7.  follow.  7.  Generalized weakness PT OT.   DVT prophylaxis: Heparin Code Status: DNR Family Communication: Updated son at beside Disposition:   Status is: Observation    Dispo: The patient is from: Home              Anticipated d/c is to: Home with home health versus SNF              Anticipated d/c date is: 2 to 3 days              Patient currently on IV Lasix, in A. fib with RVR, not stable for discharge.   Difficult to place patient unknown       Consultants:   None  Procedures:   Chest x-ray 09/24/2020    Antimicrobials:   None   Subjective: Sitting up in bed,. States SOB improving since admission. States less weak today. No CP. No abd pain.  Son at bedside.  Objective: Vitals:   09/25/20 0700 09/25/20 0830 09/25/20 0900 09/25/20 0930  BP: 103/77 123/77 115/75 120/88  Pulse: 79 (!) 117 (!) 139 (!) 135  Resp: 13 17 (!) 26 17  Temp:      TempSrc:      SpO2: 100% 94% 95% 96%  Weight:      Height:        Intake/Output Summary (Last 24 hours) at 09/25/2020 1034 Last data filed at 09/25/2020 0439 Gross per 24 hour  Intake 240 ml  Output --  Net 240 ml   Filed Weights   09/24/20 1925  Weight: 84.4 kg    Examination:  General exam: Appears calm and comfortable  Respiratory system: Bibasilar crakles up 1/3 lungs. No wheezing.  Fair air movement Cardiovascular system: Irregularly irregular.  No JVD.  Trace bilateral lower extremity edema.  Gastrointestinal system: Abdomen is nondistended, soft and nontender. No organomegaly or masses felt. Normal bowel sounds heard. Central  nervous system: Alert and oriented. No focal neurological deficits. Extremities: Symmetric 5 x 5 power. Skin: No rashes, lesions or ulcers Psychiatry: Judgement and insight appear normal. Mood & affect appropriate.     Data Reviewed: I have personally reviewed following labs and imaging studies  CBC: Recent Labs  Lab 09/24/20 1952 09/25/20 0321  WBC 4.8 4.9  HGB 8.7* 7.7*  HCT 28.3* 26.2*  MCV 97.9 99.2  PLT 131* 113*    Basic Metabolic Panel: Recent Labs  Lab 09/24/20 1952 09/25/20 0321  NA 141 144  K 5.2* 5.2*  CL 106 107  CO2 25 26  GLUCOSE 96 85  BUN 59* 62*  CREATININE 2.78* 2.81*  CALCIUM 9.3 9.0  MG  --  2.3    GFR:  Estimated Creatinine Clearance: 15.7 mL/min (A) (by C-G formula based on SCr of 2.81 mg/dL (H)).  Liver Function Tests: Recent Labs  Lab 09/24/20 2342  AST 16  ALT 6  ALKPHOS 43  BILITOT 1.0  PROT 7.2  ALBUMIN 3.7    CBG: No results for input(s): GLUCAP in the last 168 hours.   Recent Results (from the past 240 hour(s))  SARS Coronavirus 2 by RT PCR (hospital order, performed in [facility] hospital lab) Nasopharyngeal Nasopharyngeal Swab     Status: None   Collection Time: 09/25/20  1:14 AM   Specimen: Nasopharyngeal Swab  Result Value Ref Range Status   SARS Coronavirus 2 NEGATIVE NEGATIVE Final    Comment: (NOTE) SARS-CoV-2 target nucleic acids are NOT DETECTED.  The SARS-CoV-2 RNA is generally detectable in upper and lower respiratory specimens during the acute phase of infection. The lowest concentration of SARS-CoV-2 viral copies this assay can detect is 250 copies / mL. A negative result does not preclude SARS-CoV-2 infection and should not be used as the sole basis for treatment or other patient management decisions.  A negative result may occur with improper specimen collection / handling, submission of specimen other than nasopharyngeal swab, presence of viral mutation(s) within the areas targeted by this assay, and  inadequate number of viral copies (<250 copies / mL). A negative result must be combined with clinical observations, patient history, and epidemiological information.  Fact Sheet for Patients:   StrictlyIdeas.no  Fact Sheet for Healthcare Providers: BankingDealers.co.za  This test is not yet approved or  cleared by the [place] FDA and has been authorized for detection and/or diagnosis of SARS-CoV-2 by FDA under an Emergency Use Authorization (EUA).  This EUA will remain in effect (meaning this test can be used) for the duration of the COVID-19 declaration under Section 564(b)(1) of the Act, 21 U.S.C. section 360bbb-3(b)(1), unless the authorization is terminated or revoked sooner.  Performed at [facility], [address] [place], [place] [address]          Radiology Studies: DG Chest Portable 1 View  Result Date: 09/24/2020 CLINICAL DATA:  Worsening weakness and shortness of breath. EXAM: PORTABLE CHEST 1 VIEW COMPARISON:  08/01/2020 FINDINGS: Cardiac enlargement. Diffuse pulmonary vascular congestion with perihilar infiltration, likely edema. Bilateral pleural effusions with basilar atelectasis, greater on the left. Progression since previous study. Calcified and tortuous aorta. Degenerative changes in the spine and shoulders. IMPRESSION: Cardiac enlargement, pulmonary vascular congestion, perihilar edema, and bilateral pleural effusions. Electronically Signed   By: Lucienne Capers M.D.   On: 09/24/2020 23:45        Scheduled Meds: . diltiazem  180 mg Oral Daily  . furosemide  20 mg Intravenous Q12H  . heparin  5,000 Units Subcutaneous Q8H  . metoprolol tartrate  25 mg Oral BID  . sodium chloride flush  3 mL Intravenous Q12H   Continuous Infusions:   LOS: 0 days    Time spent: 40 minutes   No charge.    Irine Seal, MD [facility]   To contact the attending provider between 7A-7P  or the covering provider during after hours 7P-7A, please log into the web site [email or web address] and access using universal [place] password for that web site. If you do not have the password, please call the hospital operator.  09/25/2020, 10:34 AM

## 2020-09-25 NOTE — ED Notes (Signed)
Bedside commode set up. Pt and family informed to use call bell when needing to use restroom for strict output measurement.

## 2020-09-25 NOTE — ED Notes (Signed)
Patient sleeping at this time. Respirations even and unlabored. No signs of distress. Will continue to monitor.

## 2020-09-25 NOTE — ED Notes (Signed)
PT at bedside.

## 2020-09-25 NOTE — ED Notes (Signed)
Meal tray delivered at this time. 

## 2020-09-25 NOTE — H&P (Signed)
History and Physical    Felicia [name] [id number] DOB: Sep 11, 1928 DOA: 09/24/2020  PCP: Sofie Hartigan, MD  Patient coming from: Home via EMS  I have personally briefly reviewed patient's old medical records in [place]  Chief Complaint: Shortness of breath  HPI: Felicia [name] is a 87 y.o. female with medical history significant for permanent atrial fibrillation not currently on anticoagulation due to anemia, chronic diastolic CHF, chronic respiratory failure with hypoxia on 2 L of home O2 via [place] at all times, CKD stage IV, moderate aortic stenosis, hypertension, hyperlipidemia, anemia of chronic disease who presents to the ED for evaluation of shortness of breath. Son is at bedside to provide additional history.  Patient states she has been feeling generally weak and short of breath for few weeks now.  She has had some confusion regarding her home medications and it seems she has not been taking what has been prescribed.  She says she ran out of furosemide due to no refill however looks like she is supposed be on torsemide but it is unclear that she is taking torsemide.  She also says she ran out of carvedilol but looks like she is supposed be on metoprolol.  She reports an associated cough productive of white sputum.  She denies any hemoptysis, subjective fevers, chills, diaphoresis, chest pain, nausea, vomiting, abdominal pain.  She does report having loose bowel movements last 2 days, once per day.  ED Course:  Initial vitals showed BP 124/76, pulse 140, RR 15, temp 98.5 F, SPO2 100% on room air.  Labs show creatinine 2.78 (previously 1.90 on 08/01/2020), BUN 59, potassium 5.2, sodium 141, bicarb 25, serum glucose 96, WBC 4.8, hemoglobin 8.7, platelets 1 31,000, high-sensitivity troponin I 44, BNP 813.9, lipase 50, LFTs within normal limits.  SARS-CoV-2 PCR test is ordered and pending.  Portable chest x-ray shows cardiac enlargement with pulmonary  vascular congestion, pulmonary edema, and bilateral pleural effusions.  Patient is ordered to receive IV Lasix 20 mg and IV diltiazem 10 mg.  Hospitalist service was consulted to admit for further evaluation and management.  Review of Systems: All systems reviewed and are negative except as documented in history of present illness above.   Past Medical History:  Diagnosis Date  . Anemia   . Atrial fibrillation ([place])   . CHF (congestive heart failure) ([place])   . CKD (chronic kidney disease), stage IV ([place])   . Coronary artery disease   . GERD (gastroesophageal reflux disease)   . HOH (hard of hearing)   . Hyperlipidemia   . Hypertension   . LVH (left ventricular hypertrophy) due to hypertensive disease   . Pneumonia   . Pulmonary hypertension ([place])   . PVD (peripheral vascular disease) ([place])   . Renal disorder   . Vertigo     Past Surgical History:  Procedure Laterality Date  . APPENDECTOMY  1939  . TONSILLECTOMY  1939    Social History:  reports that she has never smoked. She has never used smokeless tobacco. She reports that she does not drink alcohol and does not use drugs.  No Known Allergies  Family History  Problem Relation Age of Onset  . Heart disease Mother   . Goiter Mother   . Bladder Cancer Father 76     Prior to Admission medications   Medication Sig Start Date End Date Taking? Authorizing Provider  albuterol (VENTOLIN HFA) 108 (90 Base) MCG/ACT inhaler Inhale 2 puffs into the lungs every 6 (six)  hours as needed for wheezing or shortness of breath. Patient not taking: No sig reported 07/24/20   Loletha Grayer, MD  carvedilol (COREG) 6.25 MG tablet Take 6.25 mg by mouth 2 (two) times daily with a meal. Patient not taking: No sig reported    [provider]  cyanocobalamin 1000 MCG tablet Take 1,000 mcg by mouth daily. Patient not taking: No sig reported    [provider]  diltiazem (TIAZAC) 180 MG 24 hr capsule Take 180 mg by mouth  daily.    [provider]  Ferrous Sulfate (IRON) 325 (65 Fe) MG TABS Take by mouth. Patient not taking: No sig reported    [provider]  metoprolol tartrate (LOPRESSOR) 25 MG tablet Take 25 mg by mouth 2 (two) times daily. 08/02/20   [provider]  torsemide (DEMADEX) 20 MG tablet Take 20 mg by mouth daily. 08/02/20   [provider]    Physical Exam: Vitals:   09/24/20 1924 09/24/20 1925 09/24/20 2205  BP: 124/76  113/75  Pulse: (!) 51  (!) 140  Resp: (!) 22  15  Temp: 98.5 F (36.9 C)    TempSrc: Oral    SpO2: 100%  100%  Weight:  84.4 kg   Height:  '5\' 9"'$  (1.753 m)    Constitutional: Elderly woman resting in bed with head elevated, NAD, calm, comfortable Eyes: PERRL, lids and conjunctivae normal ENMT: Mucous membranes are moist. Posterior pharynx clear of any exudate or lesions.Normal dentition.  Neck: normal, supple, no masses. Respiratory: Distant breath sounds bibasilarly. Normal respiratory effort. No accessory muscle use.  Cardiovascular: Tachycardic and irregularly irregular, murmur present.  Trace bilateral lower extremity edema. Abdomen: no tenderness, no masses palpated. No hepatosplenomegaly. Bowel sounds positive.  Musculoskeletal: no clubbing / cyanosis. No joint deformity upper and lower extremities. Good ROM, no contractures. Normal muscle tone.  Skin: no rashes, lesions, ulcers. No induration Neurologic: CN 2-12 grossly intact. Sensation intact. Strength 5/5 in all 4.  Psychiatric: Normal judgment and insight. Alert and oriented x 3. Normal mood.   Labs on Admission: I have personally reviewed following labs and imaging studies  CBC: Recent Labs  Lab 09/24/20 1952  WBC 4.8  HGB 8.7*  HCT 28.3*  MCV 97.9  PLT [id number]*   Basic Metabolic Panel: Recent Labs  Lab 09/24/20 1952  NA 141  K 5.2*  CL 106  CO2 25  GLUCOSE 96  BUN 59*  CREATININE 2.78*  CALCIUM 9.3   GFR: Estimated Creatinine Clearance: 15.9 mL/min  (A) (by C-G formula based on SCr of 2.78 mg/dL (H)). Liver Function Tests: Recent Labs  Lab 09/24/20 2342  AST 16  ALT 6  ALKPHOS 43  BILITOT 1.0  PROT 7.2  ALBUMIN 3.7   Recent Labs  Lab 09/24/20 2342  LIPASE 50   No results for input(s): AMMONIA in the last 168 hours. Coagulation Profile: No results for input(s): INR, PROTIME in the last 168 hours. Cardiac Enzymes: No results for input(s): CKTOTAL, CKMB, CKMBINDEX, TROPONINI in the last 168 hours. BNP (last 3 results) No results for input(s): PROBNP in the last 8760 hours. HbA1C: No results for input(s): HGBA1C in the last 72 hours. CBG: No results for input(s): GLUCAP in the last 168 hours. Lipid Profile: No results for input(s): CHOL, HDL, LDLCALC, TRIG, CHOLHDL, LDLDIRECT in the last 72 hours. Thyroid Function Tests: No results for input(s): TSH, T4TOTAL, FREET4, T3FREE, THYROIDAB in the last 72 hours. Anemia Panel: No results for input(s): VITAMINB12,  FOLATE, FERRITIN, TIBC, IRON, RETICCTPCT in the last 72 hours. Urine analysis:    Component Value Date/Time   COLORURINE YELLOW (A) 09/24/2016 0857   APPEARANCEUR CLEAR (A) 09/24/2016 0857   LABSPEC 1.010 09/24/2016 0857   PHURINE 5.0 09/24/2016 0857   GLUCOSEU NEGATIVE 09/24/2016 0857   HGBUR SMALL (A) 09/24/2016 0857   BILIRUBINUR NEGATIVE 09/24/2016 0857   KETONESUR NEGATIVE 09/24/2016 0857   PROTEINUR NEGATIVE 09/24/2016 0857   NITRITE NEGATIVE 09/24/2016 0857   LEUKOCYTESUR NEGATIVE 09/24/2016 0857    Radiological Exams on Admission: DG Chest Portable 1 View  Result Date: 09/24/2020 CLINICAL DATA:  Worsening weakness and shortness of breath. EXAM: PORTABLE CHEST 1 VIEW COMPARISON:  08/01/2020 FINDINGS: Cardiac enlargement. Diffuse pulmonary vascular congestion with perihilar infiltration, likely edema. Bilateral pleural effusions with basilar atelectasis, greater on the left. Progression since previous study. Calcified and tortuous aorta. Degenerative  changes in the spine and shoulders. IMPRESSION: Cardiac enlargement, pulmonary vascular congestion, perihilar edema, and bilateral pleural effusions. Electronically Signed   By: Lucienne Capers M.D.   On: 09/24/2020 23:45    EKG: Personally reviewed. A. fib with RVR, rate 141.  Rate is faster when compared to prior.  Assessment/Plan Principal Problem:   Permanent atrial fibrillation with rapid ventricular response (HCC) Active Problems:   Essential hypertension   Anemia of chronic disease   Acute on chronic diastolic CHF (congestive heart failure) (HCC)   Acute kidney injury superimposed on CKD (HCC)  Felicia [name] is a 87 y.o. female with medical history significant for permanent atrial fibrillation not currently on anticoagulation due to anemia, chronic diastolic CHF, chronic respiratory failure with hypoxia on 2 L of home O2 via [place] at all times, CKD stage IV, moderate aortic stenosis, hypertension, hyperlipidemia, anemia of chronic disease who is admitted with A. fib with RVR, acute on chronic diastolic CHF exacerbation, and AKI on CKD stage IV.  Permanent atrial fibrillation with RVR: Likely resulting from confusion about medications. Rate is now improving after receiving IV diltiazem 10 mg. -Resume home oral diltiazem 180 mg daily and Lopressor 25 mg twice daily -Not currently on anticoagulation due to anemia  Acute on chronic diastolic CHF: Progressive pulmonary edema and pleural effusions on chest x-ray when compared to previous. BNP elevated. Trace edema of lower extremities at time of admission. EF 55-60% by echocardiogram 07/18/2020. -Receiving IV Lasix 20 mg once -Redose IV Lasix versus resume home torsemide pending response -Monitor strict I/O's and daily weights  AKI on CKD stage IV: Suspect cardiorenal etiology. We will monitor response to Lasix.  Chronic respiratory failure with hypoxia: Wears 2 L of O2 via [place] at all times and currently saturating 100% on the  same.  Hypertension: Resume diltiazem and Lopressor as above.  Anemia of chronic disease: Chronic and appears stable without obvious bleeding.  Generalized weakness: Request PT/OT eval.  DVT prophylaxis: Subcutaneous heparin Code Status: DNR, confirmed with patient Family Communication: Discussed with patient's son at bedside Disposition Plan: From home and likely discharge to home pending improvement acute CHF and AKI, adequate rate control, and PT/OT eval Consults called: None Level of care: Med-Surg Admission status:  Status is: Observation  The patient remains OBS appropriate and will d/c before 2 midnights.  Dispo: The patient is from: Home              Anticipated d/c is to: Home              Anticipated d/c date is: 1 day  Patient currently is not medically stable to d/c.  Zada Finders MD [facility]  If 7PM-7AM, please contact night-coverage [email or web address]  09/25/2020, 12:50 AM

## 2020-09-25 NOTE — Evaluation (Signed)
Physical Therapy Evaluation Patient Details Name: Felicia [name] MRN: [id number] DOB: [birth year] Today's Date: 09/25/2020   History of Present Illness  87 y.o. female with medical history significant for permanent atrial fibrillation not currently on anticoagulation due to anemia, chronic diastolic CHF, chronic respiratory failure with hypoxia on 2 L of home O2 via [place] at all times, CKD stage IV, moderate aortic stenosis, hypertension, hyperlipidemia, anemia of chronic disease who presents to the ED for evaluation of shortness of breath. Son is at bedside to provide additional history.      Clinical Impression  Pt upright in bed with son in room and agreeable for therapy.  Pt performed bed level exercises before attempting to perform bed mobility.  Pt moved well with good technique and only required minimal verbal cuing for hand placement.  Pt then sat EOB and noted that she needed to use the [facility].  Pt was able to use FWW with CGA for standing pivot transfer to [facility].  Therapist assisted with self-care of pt and pt was able to stand and use wipes given to pt.  Pt did fatigue quickly with toileting however and returned to seated EOB following self-care.  Pt then require a rest break in which brief was donned before attempting to ambulate out of room.  Pt was able to perform transfer to sitting and ambulate 6 feet down ED hallway and back to room before coming back to bed.  Pt is safe with most movements and transfers, however due to decreased caregiver support at home and request for 24/7 support for pt safety, pt would be suited best for SNF.  Both son and pt agreed to SNF recommendation at this time.     Follow Up Recommendations SNF    Equipment Recommendations  None recommended by PT    Recommendations for Other Services       Precautions / Restrictions Precautions Precautions: Fall Restrictions Weight Bearing Restrictions: No      Mobility  Bed Mobility Overal bed mobility:  Needs Assistance Bed Mobility: Supine to Sit;Sit to Supine     Supine to sit: Min guard Sit to supine: Min guard   General bed mobility comments: min cuing for technique and safety    Transfers Overall transfer level: Needs assistance Equipment used: Rolling walker (2 wheeled);None Transfers: Sit to/from [employer] to Stand: Min guard Stand pivot transfers: Min guard;Min assist       General transfer comment: Min guard with use of RW  Ambulation/Gait Ambulation/Gait assistance: Min guard Gait Distance (Feet): 60 Feet Assistive device: Rolling walker (2 wheeled) Gait Pattern/deviations: Step-through pattern;Decreased stride length Gait velocity: decreased   General Gait Details: Pt able to ambulate with FWW however requires multiple verbal cues to keep walker close instead of far away in order to prevenet falls.  Stairs            Wheelchair Mobility    Modified Rankin (Stroke Patients Only)       Balance Overall balance assessment: Needs assistance Sitting-balance support: Feet supported Sitting balance-Leahy Scale: Good Sitting balance - Comments: no LOB   Standing balance support: During functional activity;Bilateral upper extremity supported Standing balance-Leahy Scale: Fair Standing balance comment: UE support with use of FWW.                             Pertinent Vitals/Pain Pain Assessment: No/denies pain    Home Living Family/patient expects to be discharged to::  Private residence Living Arrangements: Alone Available Help at Discharge: Family;Available PRN/intermittently Type of Home: Mobile home Home Access: Stairs to enter Entrance Stairs-Rails: Right;Left;Can reach both Entrance Stairs-Number of Steps: 8 Home Layout: One level Home Equipment: Walker - 2 wheels;Cane - single point Additional Comments: Pt reports her walker is "too big" and does not use it    Prior Function Level of Independence:  Independent with assistive device(s)         Comments: Pt has not been able to drive in several months and uses public transportation to go to appointment.     Hand Dominance   Dominant Hand: Right    Extremity/Trunk Assessment   Upper Extremity Assessment Upper Extremity Assessment: Generalized weakness    Lower Extremity Assessment Lower Extremity Assessment: Generalized weakness       Communication   Communication: No difficulties  Cognition Arousal/Alertness: Awake/alert Behavior During Therapy: WFL for tasks assessed/performed Overall Cognitive Status: Within Functional Limits for tasks assessed                                        General Comments      Exercises Other Exercises Other Exercises: Pt and son educated on roles of PT and services provided during hospital stay.  Pt and son also educated on physiological benefits of performing exercises in between bouts of therapy in order to increase strength and increased generalized mobility. Other Exercises: Pt participated in bedding change, clean up s/p urinary/bowel incontinance, toileting transfer, donning new undergarments, self cleaning when handed tissue paper.   Assessment/Plan    PT Assessment Patient needs continued PT services  PT Problem List Decreased strength;Decreased activity tolerance;Decreased balance;Decreased mobility;Decreased knowledge of use of DME;Decreased safety awareness       PT Treatment Interventions DME instruction;Gait training;Functional mobility training;Therapeutic activities;Therapeutic exercise    PT Goals (Current goals can be found in the Care Plan section)  Acute Rehab PT Goals Patient Stated Goal: to get better PT Goal Formulation: With patient Time For Goal Achievement: 10/09/20 Potential to Achieve Goals: Good    Frequency Min 2X/week   Barriers to discharge Decreased caregiver support Pt does not have care at home 24/7 which is recommended at  this current time.    Co-evaluation               AM-PAC PT "6 Clicks" Mobility  Outcome Measure Help needed turning from your back to your side while in a flat bed without using bedrails?: A Little Help needed moving from lying on your back to sitting on the side of a flat bed without using bedrails?: A Little Help needed moving to and from a bed to a chair (including a wheelchair)?: A Little Help needed standing up from a chair using your arms (e.g., wheelchair or bedside chair)?: A Little Help needed to walk in hospital room?: A Little Help needed climbing 3-5 steps with a railing? : A Lot 6 Click Score: 17    End of Session Equipment Utilized During Treatment: Gait belt Activity Tolerance: Patient tolerated treatment well Patient left: in bed;with call bell/phone within reach;with bed alarm set;with family/visitor present Nurse Communication: Mobility status PT Visit Diagnosis: Unsteadiness on feet (R26.81);Other abnormalities of gait and mobility (R26.89);Muscle weakness (generalized) (M62.81);History of falling (Z91.81);Difficulty in walking, not elsewhere classified (R26.2)    Time: [id number] PT Time Calculation (min) (ACUTE ONLY): 41 min   Charges:  PT Evaluation $PT Eval Low Complexity: 1 Low PT Treatments $Gait Training: 8-22 mins $Therapeutic Exercise: 8-22 mins $Self Care/Home Management: 8-22       Gwenlyn Saran, PT, DPT 09/25/20, 4:51 PM

## 2020-09-25 NOTE — Progress Notes (Signed)
Spoke with Dr. Grandville Silos regarding diltiazem dose this morning. We had no stock of the 180 mg diltiazem 24 H tabs. He instructed me to order the 90 mg 12 hour tablets until the 180 mg dose was available.

## 2020-09-25 NOTE — ED Notes (Signed)
Meal tray to bedside at this time.

## 2020-09-25 NOTE — Evaluation (Signed)
Occupational Therapy Evaluation Patient Details Name: Felicia [name] MRN: [id number] DOB: 01/31/1929 Today's Date: 09/25/2020    History of Present Illness 87 y.o. female with medical history significant for permanent atrial fibrillation not currently on anticoagulation due to anemia, chronic diastolic CHF, chronic respiratory failure with hypoxia on 2 L of home O2 via [place] at all times, CKD stage IV, moderate aortic stenosis, hypertension, hyperlipidemia, anemia of chronic disease who presents to the ED for evaluation of shortness of breath. Son is at bedside to provide additional history.   Clinical Impression   Patient presenting with decreased I in self care,balance, functional transfer/mobility, strength and safety awareness.  Patient reports living alone and uses [facility]. Pt uses public transportation and no longer drives. She reports extreme fatigue at home and needing assistance with IADL tasks as these things have been progressively getting more difficult. Pt does not have assistance during the day as all nearby family members work. Patient currently functioning at min A overall. Pt's son present in the room and both are agreeable to recommendation of short term rehab stay before pt returns home.  Patient will benefit from acute OT to increase overall independence in the areas of ADLs, functional mobility, and safety awareness in order to safely discharge to next venue of care.   Follow Up Recommendations  SNF    Equipment Recommendations  Other (comment) (defer to next venue of care)       Precautions / Restrictions Precautions Precautions: Fall      Mobility Bed Mobility Overal bed mobility: Needs Assistance Bed Mobility: Supine to Sit;Sit to Supine     Supine to sit: Min guard Sit to supine: Min guard   General bed mobility comments: min cuing for technique and safety    Transfers Overall transfer level: Needs assistance Equipment used: Rolling walker (2  wheeled);None Transfers: Sit to/from [employer] to Stand: Min guard Stand pivot transfers: Min guard;Min assist       General transfer comment: Min HHA and min guard with use of RW    Balance Overall balance assessment: Needs assistance Sitting-balance support: Feet supported Sitting balance-Leahy Scale: Good Sitting balance - Comments: no LOB   Standing balance support: During functional activity Standing balance-Leahy Scale: Fair Standing balance comment: UE support                           ADL either performed or assessed with clinical judgement   ADL Overall ADL's : Needs assistance/impaired     Grooming: Wash/dry hands;Wash/dry face;Oral care;Set up;Supervision/safety                   Toilet Transfer: Minimal assistance;BSC;Ambulation Toilet Transfer Details (indicate cue type and reason): without use of RW Toileting- Clothing Manipulation and Hygiene: Minimal assistance;Sit to/from stand         General ADL Comments: Pt fatigues very quickly with self care tasks     Vision Baseline Vision/History: Wears glasses Wears Glasses: At all times Patient Visual Report: No change from baseline       \       Pertinent Vitals/Pain Pain Assessment: No/denies pain     Hand Dominance Right   Extremity/Trunk Assessment Upper Extremity Assessment Upper Extremity Assessment: Generalized weakness   Lower Extremity Assessment Lower Extremity Assessment: Generalized weakness       Communication Communication Communication: No difficulties   Cognition Arousal/Alertness: Awake/alert Behavior During Therapy: WFL for tasks assessed/performed Overall Cognitive  Status: Within Functional Limits for tasks assessed                                     \           Home Living Family/patient expects to be discharged to:: Private residence Living Arrangements: Alone Available Help at Discharge: Family;Available  PRN/intermittently Type of Home: Mobile home Home Access: Stairs to enter Entrance Stairs-Number of Steps: 8 Entrance Stairs-Rails: Right;Left;Can reach both Home Layout: One level     Bathroom Shower/Tub: Teacher, early years/pre: Handicapped height Bathroom Accessibility: No   Home Equipment: Environmental consultant - 2 wheels;Cane - single point   Additional Comments: Pt reports she walker is "too big" and does not use it      Prior Functioning/Environment Level of Independence: Independent with assistive device(s)        Comments: Pt has not been able to drive in several months and uses public transportation to go to appointment.        OT Problem List: Decreased strength;Decreased activity tolerance;Impaired balance (sitting and/or standing);Decreased safety awareness;Cardiopulmonary status limiting activity;Decreased knowledge of use of DME or AE      OT Treatment/Interventions: Self-care/ADL training;Manual therapy;Therapeutic exercise;Energy conservation;DME and/or AE instruction;Cognitive remediation/compensation;Therapeutic activities;Balance training;Patient/family education    OT Goals(Current goals can be found in the care plan section) Acute Rehab OT Goals Patient Stated Goal: to get better OT Goal Formulation: With patient/family Time For Goal Achievement: 10/09/20 Potential to Achieve Goals: Fair ADL Goals Pt Will Perform Grooming: with supervision;standing Pt Will Transfer to Toilet: with supervision;bedside commode;ambulating Pt Will Perform Toileting - Clothing Manipulation and hygiene: with supervision;sit to/from stand Pt Will Perform Tub/Shower Transfer: with supervision;ambulating Pt/caregiver will Perform Home Exercise Program: Increased strength;With written HEP provided;With theraband;Both right and left upper extremity;Independently  OT Frequency: Min 1X/week   Barriers to D/C: Decreased caregiver support          \   AM-PAC OT "6 Clicks" Daily  Activity     Outcome Measure Help from another person eating meals?: None Help from another person taking care of personal grooming?: A Little Help from another person toileting, which includes using toliet, bedpan, or urinal?: A Little Help from another person bathing (including washing, rinsing, drying)?: A Little Help from another person to put on and taking off regular upper body clothing?: None Help from another person to put on and taking off regular lower body clothing?: A Little 6 Click Score: 20   End of Session Equipment Utilized During Treatment: Rolling walker Nurse Communication: Mobility status;Other (comment) (elevated HR)  Activity Tolerance: Patient tolerated treatment well Patient left: in bed;with bed alarm set;with family/visitor present;with call bell/phone within reach  OT Visit Diagnosis: Unsteadiness on feet (R26.81);Muscle weakness (generalized) (M62.81)                Time: [id number] OT Time Calculation (min): 35 min Charges:  OT General Charges $OT Visit: 1 Visit OT Evaluation $OT Eval Low Complexity: 1 Low OT Treatments $Self Care/Home Management : 8-22 mins $Therapeutic Activity: 8-22 mins  Darleen Crocker, MS, OTR/L , CBIS ascom [phone]  09/25/20, 1:04 PM

## 2020-09-26 DIAGNOSIS — H9190 Unspecified hearing loss, unspecified ear: Secondary | ICD-10-CM | POA: Diagnosis present

## 2020-09-26 DIAGNOSIS — Z9981 Dependence on supplemental oxygen: Secondary | ICD-10-CM | POA: Diagnosis not present

## 2020-09-26 DIAGNOSIS — I5033 Acute on chronic diastolic (congestive) heart failure: Secondary | ICD-10-CM | POA: Diagnosis present

## 2020-09-26 DIAGNOSIS — Z9114 Patient's other noncompliance with medication regimen: Secondary | ICD-10-CM | POA: Diagnosis not present

## 2020-09-26 DIAGNOSIS — N189 Chronic kidney disease, unspecified: Secondary | ICD-10-CM | POA: Diagnosis not present

## 2020-09-26 DIAGNOSIS — I4891 Unspecified atrial fibrillation: Secondary | ICD-10-CM | POA: Diagnosis present

## 2020-09-26 DIAGNOSIS — D631 Anemia in chronic kidney disease: Secondary | ICD-10-CM | POA: Diagnosis present

## 2020-09-26 DIAGNOSIS — I350 Nonrheumatic aortic (valve) stenosis: Secondary | ICD-10-CM | POA: Diagnosis present

## 2020-09-26 DIAGNOSIS — I2510 Atherosclerotic heart disease of native coronary artery without angina pectoris: Secondary | ICD-10-CM | POA: Diagnosis present

## 2020-09-26 DIAGNOSIS — I739 Peripheral vascular disease, unspecified: Secondary | ICD-10-CM | POA: Diagnosis present

## 2020-09-26 DIAGNOSIS — Z66 Do not resuscitate: Secondary | ICD-10-CM | POA: Diagnosis present

## 2020-09-26 DIAGNOSIS — D696 Thrombocytopenia, unspecified: Secondary | ICD-10-CM | POA: Diagnosis present

## 2020-09-26 DIAGNOSIS — E785 Hyperlipidemia, unspecified: Secondary | ICD-10-CM | POA: Diagnosis present

## 2020-09-26 DIAGNOSIS — R778 Other specified abnormalities of plasma proteins: Secondary | ICD-10-CM

## 2020-09-26 DIAGNOSIS — Z20822 Contact with and (suspected) exposure to covid-19: Secondary | ICD-10-CM | POA: Diagnosis present

## 2020-09-26 DIAGNOSIS — J9611 Chronic respiratory failure with hypoxia: Secondary | ICD-10-CM | POA: Diagnosis present

## 2020-09-26 DIAGNOSIS — I4821 Permanent atrial fibrillation: Secondary | ICD-10-CM | POA: Diagnosis present

## 2020-09-26 DIAGNOSIS — N184 Chronic kidney disease, stage 4 (severe): Secondary | ICD-10-CM | POA: Diagnosis present

## 2020-09-26 DIAGNOSIS — N179 Acute kidney failure, unspecified: Secondary | ICD-10-CM | POA: Diagnosis present

## 2020-09-26 DIAGNOSIS — Z8249 Family history of ischemic heart disease and other diseases of the circulatory system: Secondary | ICD-10-CM | POA: Diagnosis not present

## 2020-09-26 DIAGNOSIS — Z79899 Other long term (current) drug therapy: Secondary | ICD-10-CM | POA: Diagnosis not present

## 2020-09-26 DIAGNOSIS — K219 Gastro-esophageal reflux disease without esophagitis: Secondary | ICD-10-CM | POA: Diagnosis present

## 2020-09-26 DIAGNOSIS — Z8052 Family history of malignant neoplasm of bladder: Secondary | ICD-10-CM | POA: Diagnosis not present

## 2020-09-26 DIAGNOSIS — R54 Age-related physical debility: Secondary | ICD-10-CM | POA: Diagnosis present

## 2020-09-26 DIAGNOSIS — I130 Hypertensive heart and chronic kidney disease with heart failure and stage 1 through stage 4 chronic kidney disease, or unspecified chronic kidney disease: Secondary | ICD-10-CM | POA: Diagnosis present

## 2020-09-26 LAB — CBC
HCT: 25.8 % — ABNORMAL LOW (ref 36.0–46.0)
Hemoglobin: 7.5 g/dL — ABNORMAL LOW (ref 12.0–15.0)
MCH: 29 pg (ref 26.0–34.0)
MCHC: 29.1 g/dL — ABNORMAL LOW (ref 30.0–36.0)
MCV: 99.6 fL (ref 80.0–100.0)
Platelets: 114 10*3/uL — ABNORMAL LOW (ref 150–400)
RBC: 2.59 MIL/uL — ABNORMAL LOW (ref 3.87–5.11)
RDW: 17 % — ABNORMAL HIGH (ref 11.5–15.5)
WBC: 5.3 10*3/uL (ref 4.0–10.5)
nRBC: 0 % (ref 0.0–0.2)

## 2020-09-26 LAB — MAGNESIUM: Magnesium: 2.1 mg/dL (ref 1.7–2.4)

## 2020-09-26 LAB — BASIC METABOLIC PANEL
Anion gap: 11 (ref 5–15)
BUN: 64 mg/dL — ABNORMAL HIGH (ref 8–23)
CO2: 26 mmol/L (ref 22–32)
Calcium: 8.7 mg/dL — ABNORMAL LOW (ref 8.9–10.3)
Chloride: 105 mmol/L (ref 98–111)
Creatinine, Ser: 2.78 mg/dL — ABNORMAL HIGH (ref 0.44–1.00)
GFR, Estimated: 16 mL/min — ABNORMAL LOW (ref >60)
Glucose, Bld: 90 mg/dL (ref 70–99)
Potassium: 4.6 mmol/L (ref 3.5–5.1)
Sodium: 142 mmol/L (ref 135–145)

## 2020-09-26 NOTE — Progress Notes (Addendum)
PROGRESS NOTE    Felicia [name]  [id number] DOB: 10-09-1928 DOA: 09/24/2020 PCP: Sofie Hartigan, MD   Chief Complaint  Patient presents with  . Weakness    Pt has not taken meds x 3 wks because she thought she was out.     Brief Narrative:  HPI per Dr. Kateri Mc is a 87 y.o. female with medical history significant for permanent atrial fibrillation not currently on anticoagulation due to anemia, chronic diastolic CHF, chronic respiratory failure with hypoxia on 2 L of home O2 via [place] at all times, CKD stage IV, moderate aortic stenosis, hypertension, hyperlipidemia, anemia of chronic disease who presents to the ED for evaluation of shortness of breath. Son is at bedside to provide additional history.  Patient stated she has been feeling generally weak and short of breath for few weeks now.  She has had some confusion regarding her home medications and it seems she has not been taking what has been prescribed.  She says she ran out of furosemide due to no refill however looks like she is supposed be on torsemide but it is unclear that she is taking torsemide.  She also says she ran out of carvedilol but looks like she is supposed be on metoprolol.  She reports an associated cough productive of white sputum.  She denies any hemoptysis, subjective fevers, chills, diaphoresis, chest pain, nausea, vomiting, abdominal pain.  She does report having loose bowel movements last 2 days, once per day.  ED Course:  Initial vitals showed BP 124/76, pulse 140, RR 15, temp 98.5 F, SPO2 100% on room air.  Labs show creatinine 2.78 (previously 1.90 on 08/01/2020), BUN 59, potassium 5.2, sodium 141, bicarb 25, serum glucose 96, WBC 4.8, hemoglobin 8.7, platelets 1 31,000, high-sensitivity troponin I 44, BNP 813.9, lipase 50, LFTs within normal limits.  SARS-CoV-2 PCR test is ordered and pending.  Portable chest x-ray shows cardiac enlargement with pulmonary vascular  congestion, pulmonary edema, and bilateral pleural effusions.  Patient is ordered to receive IV Lasix 20 mg and IV diltiazem 10 mg.  Hospitalist service was consulted to admit for further evaluation and management.  Review of Systems: All systems reviewed and are negative except as documented in history of present illness above.   Assessment & Plan:   Principal Problem:   Permanent atrial fibrillation with rapid ventricular response (HCC) Active Problems:   Essential hypertension   Anemia of chronic disease   Chronic respiratory failure with hypoxia (HCC)   Acute on chronic diastolic CHF (congestive heart failure) (HCC)   Acute kidney injury superimposed on CKD ([place])   1 permanent atrial fibrillation with RVR Likely secondary to confusion about medications.  Patient received a dose of IV diltiazem in the ED and on presentation heart rate improved.  Patient with improvement in heart rate ranging from 90s- 110s.  Continue current regimen of Cardizem, metoprolol.  Not on anticoagulation secondary to anemia.  Outpatient follow-up.   2.  Acute on chronic diastolic CHF Patient presented worsening shortness of breath, progressive pulmonary edema and pleural effusions noted on chest x-ray.  BNP elevated.  Likely secondary to confusion with medications.  Recent 2D echo done 07/18/2020 with a EF of 55 to 60%.  Patient received a dose of IV Lasix 20 mg in the ED on presentation, urine output not properly recorded.  Placed on Lasix 20 mg IV every 12 hours.  Urine output not properly recorded.  Continue current regimen of Lopressor, Cardizem, IV Lasix.  Strict I's and O's.  Daily weights.  Outpatient follow-up with cardiology.    3.  Acute kidney injury on chronic kidney disease stage IV Likely secondary to problem #2.  Patient placed on IV Lasix.  Creatinine seems to be stabilizing around 2.7.  Follow with diuresis.    4.  Chronic respiratory failure with hypoxia On 2 L home O2.  Currently at  baseline.  Follow.  5.  Hypertension Stable on diltiazem and Lopressor.  On IV Lasix.  Follow.   6.  Anemia of chronic disease Patient with no overt bleeding.  Hemoglobin stable at 7.5.  Transfusion threshold hemoglobin < 7.  7.  Generalized weakness PT/ OT.   DVT prophylaxis: Heparin Code Status: DNR Family Communication: Updated patient.  No family at bedside.  Disposition:   Status is: Observation    Dispo: The patient is from: Home              Anticipated d/c is to: SNF              Anticipated d/c date is: 2-3 days              Patient currently on IV Lasix, in A. fib with RVR, not stable for discharge.   Difficult to place patient unknown       Consultants:   None  Procedures:   Chest x-ray 09/24/2020    Antimicrobials:   None   Subjective: Feeling irritable. States didn't sleep all night due to monitor. States I hope the good lord comes to get me. I'm tired. No CP.  She feels if she is able to get a good night sleep she will feel better.  Objective: Vitals:   09/25/20 2208 09/26/20 0322 09/26/20 0638 09/26/20 0830  BP: 111/65 103/78 105/82 119/77  Pulse: (!) 104 90 94 (!) 109  Resp: '20 20 20 20  '$ Temp:    98.1 F (36.7 C)  TempSrc:    Oral  SpO2: 98% 98% 98% 95%  Weight:      Height:        Intake/Output Summary (Last 24 hours) at 09/26/2020 1029 Last data filed at 09/26/2020 0843 Gross per 24 hour  Intake --  Output 150 ml  Net -150 ml   Filed Weights   09/24/20 1925  Weight: 84.4 kg    Examination:  General exam: NAD Respiratory system: Bibasilar crackles.  No wheezing, fair air movement.  Speaking in full sentences. Cardiovascular system: Irregularly irregular.  No JVD.  No lower extremity edema.  Gastrointestinal system: Abdomen is soft, nontender, nondistended, positive bowel sounds.  No rebound.  No guarding.  Central nervous system: Alert and oriented. No focal neurological deficits. Extremities: Symmetric 5 x 5  power. Skin: No rashes, lesions or ulcers Psychiatry: Judgement and insight appear normal. Mood & affect appropriate.     Data Reviewed: I have personally reviewed following labs and imaging studies  CBC: Recent Labs  Lab 09/24/20 1952 09/25/20 0321 09/26/20 0350  WBC 4.8 4.9 5.3  HGB 8.7* 7.7* 7.5*  HCT 28.3* 26.2* 25.8*  MCV 97.9 99.2 99.6  PLT 131* 113* 114*    Basic Metabolic Panel: Recent Labs  Lab 09/24/20 1952 09/25/20 0321 09/26/20 0350  NA 141 144 142  K 5.2* 5.2* 4.6  CL 106 107 105  CO2 '25 26 26  '$ GLUCOSE 96 85 90  BUN 59* 62* 64*  CREATININE 2.78* 2.81* 2.78*  CALCIUM 9.3 9.0 8.7*  MG  --  2.3 2.1  GFR: Estimated Creatinine Clearance: 15.9 mL/min (A) (by C-G formula based on SCr of 2.78 mg/dL (H)).  Liver Function Tests: Recent Labs  Lab 09/24/20 2342  AST 16  ALT 6  ALKPHOS 43  BILITOT 1.0  PROT 7.2  ALBUMIN 3.7    CBG: No results for input(s): GLUCAP in the last 168 hours.   Recent Results (from the past 240 hour(s))  SARS Coronavirus 2 by RT PCR (hospital order, performed in [facility] hospital lab) Nasopharyngeal Nasopharyngeal Swab     Status: None   Collection Time: 09/25/20  1:14 AM   Specimen: Nasopharyngeal Swab  Result Value Ref Range Status   SARS Coronavirus 2 NEGATIVE NEGATIVE Final    Comment: (NOTE) SARS-CoV-2 target nucleic acids are NOT DETECTED.  The SARS-CoV-2 RNA is generally detectable in upper and lower respiratory specimens during the acute phase of infection. The lowest concentration of SARS-CoV-2 viral copies this assay can detect is 250 copies / mL. A negative result does not preclude SARS-CoV-2 infection and should not be used as the sole basis for treatment or other patient management decisions.  A negative result may occur with improper specimen collection / handling, submission of specimen other than nasopharyngeal swab, presence of viral mutation(s) within the areas targeted by this assay, and  inadequate number of viral copies (<250 copies / mL). A negative result must be combined with clinical observations, patient history, and epidemiological information.  Fact Sheet for Patients:   StrictlyIdeas.no  Fact Sheet for Healthcare Providers: BankingDealers.co.za  This test is not yet approved or  cleared by the [place] FDA and has been authorized for detection and/or diagnosis of SARS-CoV-2 by FDA under an Emergency Use Authorization (EUA).  This EUA will remain in effect (meaning this test can be used) for the duration of the COVID-19 declaration under Section 564(b)(1) of the Act, 21 U.S.C. section 360bbb-3(b)(1), unless the authorization is terminated or revoked sooner.  Performed at [facility], [address] [place], [place] [address]          Radiology Studies: DG Chest Portable 1 View  Result Date: 09/24/2020 CLINICAL DATA:  Worsening weakness and shortness of breath. EXAM: PORTABLE CHEST 1 VIEW COMPARISON:  08/01/2020 FINDINGS: Cardiac enlargement. Diffuse pulmonary vascular congestion with perihilar infiltration, likely edema. Bilateral pleural effusions with basilar atelectasis, greater on the left. Progression since previous study. Calcified and tortuous aorta. Degenerative changes in the spine and shoulders. IMPRESSION: Cardiac enlargement, pulmonary vascular congestion, perihilar edema, and bilateral pleural effusions. Electronically Signed   By: Lucienne Capers M.D.   On: 09/24/2020 23:45        Scheduled Meds: . diltiazem  180 mg Oral Q2200  . furosemide  20 mg Intravenous Q12H  . heparin  5,000 Units Subcutaneous Q8H  . metoprolol tartrate  25 mg Oral BID  . sodium chloride flush  3 mL Intravenous Q12H   Continuous Infusions:   LOS: 0 days    Time spent: 40 minutes     Irine Seal, MD [facility]   To contact the attending provider between 7A-7P or the  covering provider during after hours 7P-7A, please log into the web site [email or web address] and access using universal [place] password for that web site. If you do not have the password, please call the hospital operator.  09/26/2020, 10:29 AM

## 2020-09-26 NOTE — Progress Notes (Signed)
Occupational Therapy Treatment Patient Details Name: Felicia [name] MRN: [id number] DOB: Nov 22, 1928 Today's Date: 09/26/2020    History of present illness 87 y.o. female with medical history significant for permanent atrial fibrillation not currently on anticoagulation due to anemia, chronic diastolic CHF, chronic respiratory failure with hypoxia on 2 L of home O2 via [place] at all times, CKD stage IV, moderate aortic stenosis, hypertension, hyperlipidemia, anemia of chronic disease who presents to the ED for evaluation of shortness of breath. Son is at bedside to provide additional history.   OT comments  Felicia [name] was seen for OT treatment on this date. Upon arrival to room pt finishing breakfast, agreeable to grooming at sink. Pt demonstrated decreased safety awareness - requires cues t/o for safety and assistance for lines/leads mgmt. Pt required SBA tooth brushing standing sink side - assist for lines mgmt and safety awareness, pt utilized B forearm support on sink 2/2 fatigue. SpO2 desat mid-high 80s c in room mobility ~20 ft, resolved with standing rest break. Pt making good progress toward goals. Pt continues to benefit from skilled OT services to maximize return to PLOF and minimize risk of future falls, injury, caregiver burden, and readmission. Will continue to follow POC. Discharge recommendation remains appropriate.    Follow Up Recommendations  SNF    Equipment Recommendations  Other (comment) (defer to next venue of care)    Recommendations for Other Services      Precautions / Restrictions Precautions Precautions: Fall Restrictions Weight Bearing Restrictions: No       Mobility Bed Mobility Overal bed mobility: Needs Assistance Bed Mobility: Supine to Sit;Sit to Supine     Supine to sit: Min guard Sit to supine: Min guard   General bed mobility comments: assist for lines mgmt  Transfers Overall transfer level: Needs assistance Equipment used: Rolling  walker (2 wheeled) Transfers: Sit to/from Stand Sit to Stand: Min guard         General transfer comment: SBA + lines mgmt    Balance Overall balance assessment: Needs assistance Sitting-balance support: Feet supported Sitting balance-Leahy Scale: Good     Standing balance support: During functional activity;Bilateral upper extremity supported Standing balance-Leahy Scale: Fair Standing balance comment: B forearms on sink                           ADL either performed or assessed with clinical judgement   ADL Overall ADL's : Needs assistance/impaired                                       General ADL Comments: SBA tooth brushing standing sink side - assist for lines mgmt and safety awareness, pt utilized B forearm support on sink 2/2 fatigue               Cognition Arousal/Alertness: Awake/alert Behavior During Therapy: WFL for tasks assessed/performed Overall Cognitive Status: Within Functional Limits for tasks assessed                                          Exercises Exercises: Other exercises Other Exercises Other Exercises: Pt educated re: falls prevention, ECS Other Exercises: Bed mobility, tooth brushing, hand washing, sup<>sit, sit<>stand, sitting/standing balance/tolerance, ~30 ft in room mobility   Shoulder Instructions  General Comments SpO2 desat mid-high 80s c in room mobility ~20 ft, resolved woth standing rest break    Pertinent Vitals/ Pain       Pain Assessment: No/denies pain         Frequency  Min 1X/week        Progress Toward Goals  OT Goals(current goals can now be found in the care plan section)  Progress towards OT goals: Progressing toward goals  Acute Rehab OT Goals Patient Stated Goal: to get better OT Goal Formulation: With patient/family Time For Goal Achievement: 10/09/20 Potential to Achieve Goals: Fair ADL Goals Pt Will Perform Grooming: with  supervision;standing Pt Will Transfer to Toilet: with supervision;bedside commode;ambulating Pt Will Perform Toileting - Clothing Manipulation and hygiene: with supervision;sit to/from stand Pt Will Perform Tub/Shower Transfer: with supervision;ambulating Pt/caregiver will Perform Home Exercise Program: Increased strength;With written HEP provided;With theraband;Both right and left upper extremity;Independently  Plan Discharge plan remains appropriate;Frequency remains appropriate       AM-PAC OT "6 Clicks" Daily Activity     Outcome Measure   Help from another person eating meals?: None Help from another person taking care of personal grooming?: A Little Help from another person toileting, which includes using toliet, bedpan, or urinal?: A Little Help from another person bathing (including washing, rinsing, drying)?: A Little Help from another person to put on and taking off regular upper body clothing?: None Help from another person to put on and taking off regular lower body clothing?: A Little 6 Click Score: 20    End of Session Equipment Utilized During Treatment: Rolling walker;Oxygen  OT Visit Diagnosis: Unsteadiness on feet (R26.81);Muscle weakness (generalized) (M62.81)   Activity Tolerance Patient tolerated treatment well   Patient Left in bed;with call bell/phone within reach   Nurse Communication          Time: [id number] OT Time Calculation (min): 23 min  Charges: OT General Charges $OT Visit: 1 Visit OT Treatments $Self Care/Home Management : 23-37 mins  Dessie Coma, M.S. OTR/L  09/26/20, 11:15 AM  ascom [phone]

## 2020-09-26 NOTE — NC FL2 (Addendum)
[place] LEVEL OF CARE SCREENING TOOL     IDENTIFICATION  Patient Name: Felicia [name] Birthdate: 1929-03-30 Sex: female Admission Date (Current Location): 09/24/2020  [place] and [place] Number:  Engineering geologist and Address:  [facility], [address] [place], [place] [address]      Provider Number: [id number]  Attending Physician Name and Address:  Eugenie Filler, MD  Relative Name and Phone Number:  Ninfa Linden" (Son)   [phone]    Current Level of Care: Hospital Recommended Level of Care: [place] Prior Approval Number:    Date Approved/Denied:   PASRR Number: [id number]  Discharge Plan: SNF    Current Diagnoses: Patient Active Problem List   Diagnosis Date Noted  . Permanent atrial fibrillation with rapid ventricular response ([place]) 09/25/2020  . Acute kidney injury superimposed on CKD ([place]) 09/25/2020  . Dyspnea 08/06/2020  . Atrial fibrillation, chronic ([place])   . Acute on chronic diastolic CHF (congestive heart failure) ([place])   . Permanent atrial fibrillation ([place])   . Acquired thrombophilia ([place])   . Weakness   . Chronic respiratory failure with hypoxia ([place]) 07/18/2020  . Atrial fibrillation with RVR ([place]) 07/18/2020  . Monocytosis 10/03/2019  . Normocytic anemia 09/17/2019  . Hyperlipidemia, mixed 09/17/2019  . Peripheral vascular disease ([place]) 09/17/2019  . Anemia of chronic disease 09/17/2019  . ANA positive 09/01/2019  . Flat feet, bilateral 09/01/2019  . CKD (chronic kidney disease), stage IV ([place]) 09/01/2019  . Primary osteoarthritis of hand 09/01/2019  . Dizziness 02/24/2019  . LVH (left ventricular hypertrophy) due to hypertensive disease, without heart failure 01/28/2018  . HCAP (healthcare-associated pneumonia) 09/24/2016  . Sepsis ([place]) 09/12/2016  . Community acquired pneumonia 09/12/2016  . Hypoxia 09/12/2016  . Pleuritic chest pain 09/12/2016  .  Pneumonia 09/12/2016  . Essential hypertension 01/05/2015  . Mild aortic valve stenosis 10/20/2014  . Moderate mitral insufficiency 10/20/2014  . Moderate tricuspid insufficiency 10/20/2014  . Chronic pulmonary hypertension ([place]) 09/22/2014  . Venous insufficiency of both lower extremities 09/22/2014  . CTS (carpal tunnel syndrome) 03/21/2014    Orientation RESPIRATION BLADDER Height & Weight     Self,Time,Situation,Place  Normal Incontinent Weight: 186 lb (84.4 kg) Height:  '5\' 9"'$  (175.3 cm)  BEHAVIORAL SYMPTOMS/MOOD NEUROLOGICAL BOWEL NUTRITION STATUS      Incontinent Diet  AMBULATORY STATUS COMMUNICATION OF NEEDS Skin   Limited Assist Verbally Normal                       Personal Care Assistance Level of Assistance  Bathing,Feeding,Dressing,Total care Bathing Assistance: Limited assistance Feeding assistance: Limited assistance Dressing Assistance: Limited assistance Total Care Assistance: Limited assistance   Functional Limitations Info  Sight,Speech,Hearing Sight Info: Adequate Hearing Info: Adequate Speech Info: Adequate    SPECIAL CARE FACTORS FREQUENCY        OT Min 1X per week PT Min 2X per week                  Contractures Contractures Info: Not present    Additional Factors Info                  Current Medications (09/26/2020):  This is the current hospital active medication list Current Facility-Administered Medications  Medication Dose Route Frequency Provider Last Rate Last Admin  . acetaminophen (TYLENOL) tablet 650 mg  650 mg Oral Q6H PRN Lenore Cordia, MD       Or  .  acetaminophen (TYLENOL) suppository 650 mg  650 mg Rectal Q6H PRN Zada Finders R, MD      . diltiazem (CARDIZEM CD) 24 hr capsule 180 mg  180 mg Oral Q2200 Eugenie Filler, MD   180 mg at 09/25/20 2143  . furosemide (LASIX) injection 20 mg  20 mg Intravenous Q12H Eugenie Filler, MD   20 mg at 09/26/20 1149  . heparin injection 5,000 Units  5,000 Units  Subcutaneous Q8H Lenore Cordia, MD   5,000 Units at 09/26/20 1726  . metoprolol tartrate (LOPRESSOR) tablet 25 mg  25 mg Oral BID Lenore Cordia, MD   25 mg at 09/26/20 1149  . ondansetron (ZOFRAN) tablet 4 mg  4 mg Oral Q6H PRN Lenore Cordia, MD       Or  . ondansetron (ZOFRAN) injection 4 mg  4 mg Intravenous Q6H PRN Zada Finders R, MD      . sodium chloride flush (NS) 0.9 % injection 3 mL  3 mL Intravenous Q12H Lenore Cordia, MD   3 mL at 09/26/20 1149   Current Outpatient Medications  Medication Sig Dispense Refill  . albuterol (VENTOLIN HFA) 108 (90 Base) MCG/ACT inhaler Inhale 2 puffs into the lungs every 6 (six) hours as needed for wheezing or shortness of breath. 18 g 0  . cyanocobalamin 1000 MCG tablet Take 1,000 mcg by mouth daily.    Marland Kitchen diltiazem (TIAZAC) 180 MG 24 hr capsule Take 180 mg by mouth daily.    . furosemide (LASIX) 20 MG tablet Take 20 mg by mouth daily.    . metoprolol tartrate (LOPRESSOR) 25 MG tablet Take 25 mg by mouth 2 (two) times daily.    . predniSONE (DELTASONE) 20 MG tablet Take 20 mg by mouth daily.    Marland Kitchen torsemide (DEMADEX) 20 MG tablet Take 20 mg by mouth daily.       Discharge Medications: Please see discharge summary for a list of discharge medications.  Relevant Imaging Results:  Relevant Lab Results:   Additional Information SS#[id number]  Adelene Amas, LCSWA

## 2020-09-26 NOTE — TOC Initial Note (Addendum)
Transition of Care [facility]) - Initial/Assessment Note    Patient Details  Name: Tresure Barletta MRN: [id number] Date of Birth: 1929/05/12  Transition [facility]) CM/SW Contact:    Ova Freshwater Phone Number: [phone] 09/26/2020, 7:40 PM  Clinical Narrative:                  Patient presents at [facility] due to generalized weakness. CSW spoke to patient's Roseanne Reno "[name]" (Son) [phone] and patient.  CSW explained the role of TOC in patient care.  Patient lives her on her own, uses a cane and walker to ambulate.  Patient's son Mr. Trena Platt stated the patient has increasing difficulty performing ADLs and has been forgetful and is forgetting to take her medications or to get them refilled.  Mr. Trena Platt stated the patient uses 2L O2 at home but neither of them can remember the name of the supplies.  Mr. Trena Platt stated the patient uses [facility] for transportation to appointments. Mr. Trena Platt stated the patient is able to get all of her medications but has become more forgetful lately and is not refilling them on time and "she hasn't taken her heart medication for three weeks." CSW explained the SNF placement process and estimated timeline. Mr Trena Platt stated their preference for SNF are [facility] or [facility] but no [place].  FL2 sent for signature, PASSR # [id number]  Expected Discharge Plan: [place] Barriers to Discharge: Continued Medical Work up,SNF Pending bed offer   Patient Goals and CMS Choice        Expected Discharge Plan and Services Expected Discharge Plan: [place] Choice: [place] (Preferred SNF - [facility] or [facility]) Living arrangements for the past 2 months: Mobile Home                                      Prior Living Arrangements/Services Living arrangements for the past 2 months: Mobile Home Lives with:: Self Patient language and need  for interpreter reviewed:: Yes Do you feel safe going back to the place where you live?: Yes      Need for Family Participation in Patient Care: Yes (Comment) Care giver support system in place?: Yes (comment) Current home services: DME (2 L oxygen) Criminal Activity/Legal Involvement Pertinent to Current Situation/Hospitalization: No - Comment as needed  Activities of Daily Living      Permission Sought/Granted Permission sought to share information with : Family Supports Permission granted to share information with : Yes, Verbal Permission Granted  Share Information with NAME: Ninfa Linden" (Son)   [phone]           Emotional Assessment   Attitude/Demeanor/Rapport: Engaged Affect (typically observed): Stable Orientation: : Oriented to Self,Oriented to Place,Oriented to Situation Alcohol / Substance Use: Not Applicable Psych Involvement: No (comment)  Admission diagnosis:  Permanent atrial fibrillation with rapid ventricular response ([place]) [I48.21] Patient Active Problem List   Diagnosis Date Noted  . Permanent atrial fibrillation with rapid ventricular response ([place]) 09/25/2020  . Acute kidney injury superimposed on CKD ([place]) 09/25/2020  . Dyspnea 08/06/2020  . Atrial fibrillation, chronic ([place])   . Acute on chronic diastolic CHF (congestive heart failure) ([place])   . Permanent atrial fibrillation ([place])   . Acquired thrombophilia ([place])   . Weakness   . Chronic respiratory failure with hypoxia (HCC)  07/18/2020  . Atrial fibrillation with RVR ([place]) 07/18/2020  . Monocytosis 10/03/2019  . Normocytic anemia 09/17/2019  . Hyperlipidemia, mixed 09/17/2019  . Peripheral vascular disease ([place]) 09/17/2019  . Anemia of chronic disease 09/17/2019  . ANA positive 09/01/2019  . Flat feet, bilateral 09/01/2019  . CKD (chronic kidney disease), stage IV ([place]) 09/01/2019  . Primary osteoarthritis of hand 09/01/2019  . Dizziness 02/24/2019  . LVH (left ventricular  hypertrophy) due to hypertensive disease, without heart failure 01/28/2018  . HCAP (healthcare-associated pneumonia) 09/24/2016  . Sepsis ([place]) 09/12/2016  . Community acquired pneumonia 09/12/2016  . Hypoxia 09/12/2016  . Pleuritic chest pain 09/12/2016  . Pneumonia 09/12/2016  . Essential hypertension 01/05/2015  . Mild aortic valve stenosis 10/20/2014  . Moderate mitral insufficiency 10/20/2014  . Moderate tricuspid insufficiency 10/20/2014  . Chronic pulmonary hypertension ([place]) 09/22/2014  . Venous insufficiency of both lower extremities 09/22/2014  . CTS (carpal tunnel syndrome) 03/21/2014   PCP:  Sofie Hartigan, MD Pharmacy:   [facility] [address] [place] [address] Phone: [phone] Fax: [phone]     Social Determinants of Health (SDOH) Interventions    Readmission Risk Interventions No flowsheet data found.

## 2020-09-27 LAB — CBC
HCT: 28.4 % — ABNORMAL LOW (ref 36.0–46.0)
Hemoglobin: 8.5 g/dL — ABNORMAL LOW (ref 12.0–15.0)
MCH: 29.8 pg (ref 26.0–34.0)
MCHC: 29.9 g/dL — ABNORMAL LOW (ref 30.0–36.0)
MCV: 99.6 fL (ref 80.0–100.0)
Platelets: 144 10*3/uL — ABNORMAL LOW (ref 150–400)
RBC: 2.85 MIL/uL — ABNORMAL LOW (ref 3.87–5.11)
RDW: 17 % — ABNORMAL HIGH (ref 11.5–15.5)
WBC: 5.8 10*3/uL (ref 4.0–10.5)
nRBC: 0 % (ref 0.0–0.2)

## 2020-09-27 LAB — BASIC METABOLIC PANEL
Anion gap: 12 (ref 5–15)
BUN: 60 mg/dL — ABNORMAL HIGH (ref 8–23)
CO2: 25 mmol/L (ref 22–32)
Calcium: 9.1 mg/dL (ref 8.9–10.3)
Chloride: 105 mmol/L (ref 98–111)
Creatinine, Ser: 2.9 mg/dL — ABNORMAL HIGH (ref 0.44–1.00)
GFR, Estimated: 15 mL/min — ABNORMAL LOW (ref >60)
Glucose, Bld: 93 mg/dL (ref 70–99)
Potassium: 4.5 mmol/L (ref 3.5–5.1)
Sodium: 142 mmol/L (ref 135–145)

## 2020-09-27 MED ORDER — FUROSEMIDE 10 MG/ML IJ SOLN
INTRAMUSCULAR | Status: AC
  Filled 2020-09-27: qty 2

## 2020-09-27 NOTE — Progress Notes (Signed)
Physical Therapy Treatment Patient Details Name: Felicia [name] MRN: [id number] DOB: 1928/12/08 Today's Date: 09/27/2020    History of Present Illness 87 y.o. female with medical history significant for permanent atrial fibrillation not currently on anticoagulation due to anemia, chronic diastolic CHF, chronic respiratory failure with hypoxia on 2 L of home O2 via [place] at all times, CKD stage IV, moderate aortic stenosis, hypertension, hyperlipidemia, anemia of chronic disease who presents to the ED for evaluation of shortness of breath. Son is at bedside to provide additional history.    PT Comments    Pt is making gradual progress towards goals. Attempted to use SPC for mobility (baseline), however unsteady. No RW available, will bring for next session. All mobility performed on 2L of O2 with sats WNL with exertion. Seated there-ex performed and bed linen changed. Decreased endurance/strength noted. Continue to recommend SNF at this time.  Follow Up Recommendations  SNF     Equipment Recommendations  None recommended by PT    Recommendations for Other Services       Precautions / Restrictions Precautions Precautions: Fall Restrictions Weight Bearing Restrictions: No    Mobility  Bed Mobility Overal bed mobility: Needs Assistance Bed Mobility: Supine to Sit;Sit to Supine     Supine to sit: Min guard Sit to supine: Min guard   General bed mobility comments: safe technique performed. Slightly SOB with exertion, O2 sats WNL  Transfers Overall transfer level: Needs assistance Equipment used: Straight cane Transfers: Sit to/from Stand Sit to Stand: Mod assist         General transfer comment: 3 attempts for successful stand. Once standing, forward flexed posture noted and able to hold SPC in R hand. Unsteady. No RW in room, please trial for next visit.  Ambulation/Gait Ambulation/Gait assistance: Min assist Gait Distance (Feet): 3 Feet Assistive device:  Straight cane Gait Pattern/deviations: Step-to pattern     General Gait Details: ambulated over to recliner. Slow step to gait pattern and unsteady. Increased SOB symptoms with all mobility performed on 2L with sats at 96%.   Stairs             Wheelchair Mobility    Modified Rankin (Stroke Patients Only)       Balance Overall balance assessment: Needs assistance Sitting-balance support: Feet supported Sitting balance-Leahy Scale: Good     Standing balance support: Single extremity supported Standing balance-Leahy Scale: Poor                              Cognition Arousal/Alertness: Awake/alert Behavior During Therapy: WFL for tasks assessed/performed Overall Cognitive Status: Within Functional Limits for tasks assessed                                        Exercises Other Exercises Other Exercises: seated ther-ex performed on B LE including LAQ, hip abd/add, quad sets, hip add squeezes, SAQ, and scap squeezes. All ther-ex performed x 10 reps with cga Other Exercises: Linen soiled once getting up to recliner, this Probation officer changed linen on bed    General Comments        Pertinent Vitals/Pain Pain Assessment: No/denies pain    Home Living                      Prior Function  PT Goals (current goals can now be found in the care plan section) Acute Rehab PT Goals Patient Stated Goal: to get better PT Goal Formulation: With patient Time For Goal Achievement: 10/09/20 Potential to Achieve Goals: Good Progress towards PT goals: Progressing toward goals    Frequency    Min 2X/week      PT Plan Current plan remains appropriate    Co-evaluation              AM-PAC PT "6 Clicks" Mobility   Outcome Measure  Help needed turning from your back to your side while in a flat bed without using bedrails?: A Little Help needed moving from lying on your back to sitting on the side of a flat bed without  using bedrails?: A Little Help needed moving to and from a bed to a chair (including a wheelchair)?: A Little Help needed standing up from a chair using your arms (e.g., wheelchair or bedside chair)?: A Little Help needed to walk in hospital room?: A Lot Help needed climbing 3-5 steps with a railing? : Total 6 Click Score: 15    End of Session Equipment Utilized During Treatment: Oxygen;Gait belt Activity Tolerance: Patient tolerated treatment well Patient left: in chair (no green box, CNA notified) Nurse Communication: Mobility status PT Visit Diagnosis: Unsteadiness on feet (R26.81);Other abnormalities of gait and mobility (R26.89);Muscle weakness (generalized) (M62.81);History of falling (Z91.81);Difficulty in walking, not elsewhere classified (R26.2)     Time: [id number] PT Time Calculation (min) (ACUTE ONLY): 42 min  Charges:  $Therapeutic Exercise: 38-52 mins                     Greggory Stallion, [place], DPT [phone]    Feige Lowdermilk 09/27/2020, 4:42 PM

## 2020-09-27 NOTE — Progress Notes (Signed)
Mobility Specialist - Progress Note   09/27/20 1600  Mobility  Range of Motion/Exercises Right leg;Left leg (AP, SLR, ABD, MARCH)  Level of Assistance Minimal assist, patient does 75% or more  Assistive Device None  Distance Ambulated (ft) 0 ft  Mobility Response Tolerated well  Mobility performed by Mobility specialist  $Mobility charge 1 Mobility    Pt was sitting in recliner upon arrival utilizing 4L [place] O2. Pt agreed to session. Pt requested to ambulate this date, however no AD in room to perform safely. Pt performed seated exercises: ankle pumps, straight leg raises, and abduction. OOC activity deferred d/t absence of proper equipment. Pt's HR monitored with a-fib activity ranging between 97 and 120 bpm. O2 sat at 98%. Overall, pt tolerated session well. Pt was left in recliner with all needs in reach. Pt motivated to attempt ambulatory activity next available date.    Kathee Delton Mobility Specialist 09/27/20, 4:46 PM

## 2020-09-27 NOTE — Progress Notes (Signed)
PROGRESS NOTE    Felicia [name]  [id number] DOB: 12-02-1928 DOA: 09/24/2020 PCP: Sofie Hartigan, MD    Chief Complaint  Patient presents with  . Weakness    Pt has not taken meds x 3 wks because she thought she was out.     Brief Narrative:  87 year old lady prior history of atrial fibrillation not on anticoagulation due to anemia, chronic diastolic heart failure, chronic respiratory failure with hypoxia on 2 L of nasal cannula oxygen at home, stage IV CKD, hypertension, hyperlipidemia, anemia of chronic disease presents to ED for shortness of breath.  She was admitted for acute on chronic diastolic heart failure as her chest x-ray showed pulmonary vascular congestion, pulmonary edema and bilateral pleural effusions. She has been appropriately diuresed with IV Lasix.  Assessment & Plan:   Principal Problem:   Permanent atrial fibrillation with rapid ventricular response (HCC) Active Problems:   Essential hypertension   Anemia of chronic disease   Chronic respiratory failure with hypoxia (HCC)   Acute on chronic diastolic CHF (congestive heart failure) (HCC)   Acute kidney injury superimposed on CKD ([place])   Acute on chronic diastolic heart failure secondary to missing her medications for about 3 weeks. Appropriate diuresis with IV Lasix 20 mg twice daily.  Slight increase in creatinine with diuresis. She appears euvolemic today,.  Strict intake and output and daily weights.  Hold Lasix today and repeat creatinine in the morning. Recommend outpatient follow-up with cardiology.    Mild AKI on stage 4 CKD Hold lasix today and repeat creatinine in am.    Hypertension:  Well controlled BP parameters.    Chronic respiratory failure with hypoxic on 2 lit at home.  Continue the same.   Anemia of chronic disease:  Hemoglobin stable between 7 to 8.    Mild thrombocytopenia:  Improving.    Permanent atrial fib  Rate well controlled.    In view of  multiple medical problems, palliative care consulted for goals of care.     DVT prophylaxis: (Lovenox/ Code Status: (DNR) Family Communication:none at bedside Disposition:   Status is: Inpatient  Remains inpatient appropriate because:Unsafe d/c plan   Dispo: The patient is from: Home              Anticipated d/c is to: SNF              Anticipated d/c date is: 1 day              Patient currently is not medically stable to d/c.   Difficult to place patient No       Level of care: Med-Surg Consultants:   None.    Procedures: none.   Antimicrobials: none.   Subjective: No complaints.   Objective: Vitals:   09/27/20 0448 09/27/20 0725 09/27/20 0755 09/27/20 1245  BP:  102/71 104/60 115/78  Pulse:  70 (!) 106 70  Resp:  '18 16 18  '$ Temp:  98.6 F (37 C) 98.6 F (37 C) 98.5 F (36.9 C)  TempSrc:  Oral Oral Oral  SpO2:  99% 93% 96%  Weight: 84.7 kg     Height:        Intake/Output Summary (Last 24 hours) at 09/27/2020 1444 Last data filed at 09/27/2020 0000 Gross per 24 hour  Intake 240 ml  Output --  Net 240 ml   Filed Weights   09/24/20 1925 09/27/20 0354 09/27/20 0448  Weight: 84.4 kg 84.7 kg 84.7 kg    Examination:  General exam: Appears calm and comfortable  Respiratory system: air entry fair, on RA,  Cardiovascular system: S1 & S2 heard, RRR. No JVD,No pedal edema. Gastrointestinal system: Abdomen is nondistended, soft and nontender.Normal bowel sounds heard. Central nervous system: Alert and oriented. No focal neurological deficits. Extremities: Symmetric 5 x 5 power. Skin: No rashes, lesions or ulcers Psychiatry:  Mood & affect appropriate.     Data Reviewed: I have personally reviewed following labs and imaging studies  CBC: Recent Labs  Lab 09/24/20 1952 09/25/20 0321 09/26/20 0350 09/27/20 0600  WBC 4.8 4.9 5.3 5.8  HGB 8.7* 7.7* 7.5* 8.5*  HCT 28.3* 26.2* 25.8* 28.4*  MCV 97.9 99.2 99.6 99.6  PLT 131* 113* 114* 144*     Basic Metabolic Panel: Recent Labs  Lab 09/24/20 1952 09/25/20 0321 09/26/20 0350 09/27/20 0600  NA 141 144 142 142  K 5.2* 5.2* 4.6 4.5  CL 106 107 105 105  CO2 '25 26 26 25  '$ GLUCOSE 96 85 90 93  BUN 59* 62* 64* 60*  CREATININE 2.78* 2.81* 2.78* 2.90*  CALCIUM 9.3 9.0 8.7* 9.1  MG  --  2.3 2.1  --     GFR: Estimated Creatinine Clearance: 15.3 mL/min (A) (by C-G formula based on SCr of 2.9 mg/dL (H)).  Liver Function Tests: Recent Labs  Lab 09/24/20 2342  AST 16  ALT 6  ALKPHOS 43  BILITOT 1.0  PROT 7.2  ALBUMIN 3.7    CBG: No results for input(s): GLUCAP in the last 168 hours.   Recent Results (from the past 240 hour(s))  SARS Coronavirus 2 by RT PCR (hospital order, performed in [facility] hospital lab) Nasopharyngeal Nasopharyngeal Swab     Status: None   Collection Time: 09/25/20  1:14 AM   Specimen: Nasopharyngeal Swab  Result Value Ref Range Status   SARS Coronavirus 2 NEGATIVE NEGATIVE Final    Comment: (NOTE) SARS-CoV-2 target nucleic acids are NOT DETECTED.  The SARS-CoV-2 RNA is generally detectable in upper and lower respiratory specimens during the acute phase of infection. The lowest concentration of SARS-CoV-2 viral copies this assay can detect is 250 copies / mL. A negative result does not preclude SARS-CoV-2 infection and should not be used as the sole basis for treatment or other patient management decisions.  A negative result may occur with improper specimen collection / handling, submission of specimen other than nasopharyngeal swab, presence of viral mutation(s) within the areas targeted by this assay, and inadequate number of viral copies (<250 copies / mL). A negative result must be combined with clinical observations, patient history, and epidemiological information.  Fact Sheet for Patients:   StrictlyIdeas.no  Fact Sheet for Healthcare Providers: BankingDealers.co.za  This  test is not yet approved or  cleared by the [place] FDA and has been authorized for detection and/or diagnosis of SARS-CoV-2 by FDA under an Emergency Use Authorization (EUA).  This EUA will remain in effect (meaning this test can be used) for the duration of the COVID-19 declaration under Section 564(b)(1) of the Act, 21 U.S.C. section 360bbb-3(b)(1), unless the authorization is terminated or revoked sooner.  Performed at [facility], [address] [place], [place] [address]          Radiology Studies: No results found.      Scheduled Meds: . diltiazem  180 mg Oral Q2200  . furosemide      . heparin  5,000 Units Subcutaneous Q8H  . metoprolol tartrate  25 mg Oral BID  .  sodium chloride flush  3 mL Intravenous Q12H   Continuous Infusions:   LOS: 1 day        Hosie Poisson, MD [facility]   To contact the attending provider between 7A-7P or the covering provider during after hours 7P-7A, please log into the web site [email or web address] and access using universal  password for that web site. If you do not have the password, please call the hospital operator.  09/27/2020, 2:44 PM

## 2020-09-28 ENCOUNTER — Encounter: Payer: Self-pay | Admitting: Internal Medicine

## 2020-09-28 LAB — BASIC METABOLIC PANEL
Anion gap: 8 (ref 5–15)
BUN: 57 mg/dL — ABNORMAL HIGH (ref 8–23)
CO2: 27 mmol/L (ref 22–32)
Calcium: 8.8 mg/dL — ABNORMAL LOW (ref 8.9–10.3)
Chloride: 105 mmol/L (ref 98–111)
Creatinine, Ser: 2.61 mg/dL — ABNORMAL HIGH (ref 0.44–1.00)
GFR, Estimated: 17 mL/min — ABNORMAL LOW (ref >60)
Glucose, Bld: 92 mg/dL (ref 70–99)
Potassium: 4.7 mmol/L (ref 3.5–5.1)
Sodium: 140 mmol/L (ref 135–145)

## 2020-09-28 LAB — SARS CORONAVIRUS 2 BY RT PCR (HOSPITAL ORDER, PERFORMED IN ~~LOC~~ HOSPITAL LAB): SARS Coronavirus 2: NEGATIVE

## 2020-09-28 LAB — SARS CORONAVIRUS 2 (TAT 6-24 HRS): SARS Coronavirus 2: NEGATIVE

## 2020-09-28 NOTE — Progress Notes (Signed)
Mobility Specialist - Progress Note   09/28/20 1100  Mobility  Activity Ambulated in hall  Level of Assistance Minimal assist, patient does 75% or more  Assistive Device Front wheel walker  Distance Ambulated (ft) 200 ft  Mobility Response Tolerated well  Mobility performed by Mobility specialist  $Mobility charge 1 Mobility     Pre-mobility: 107 HR, 100% SpO2 During mobility: 78 HR, 85% SpO2 Post-mobility: 60 HR, 96% SpO2   Pt was lying in bed upon arrival utilizing 4L [place] O2. Pt agreed to session. Pt denied pain, nausea, or fatigue. Pt was able to get EOB modI and stood to RW with minA. Pt's O2 difficult to determine d/t poor pleth via pulse ox and dinamap. Pulse ox placed on pt's ear to get most accurate reading. Eventually pt's O2 did read at 85%, pt c/o only mild SOB. An extensive rest break was taken to get sats to increase. Pt's O2 was increased to 6L with no change in sats and increased again to 8L to get sats to 90%. A-fib HR ranging between 60-120 bpm during session. Pt stated that her legs felt stronger today than in last recent days. No LOB noted. Pt denied weakness in extremities. Overall, pt tolerated session well. Pt was left in bed with all needs in reach and alarm set. Nurse notified.    Kathee Delton Mobility Specialist 09/28/20, 11:59 AM

## 2020-09-28 NOTE — Discharge Summary (Signed)
Physician Discharge Summary  Felicia [name] [facility] [id number] DOB: Nov 21, 1928 DOA: 09/24/2020  PCP: Sofie Hartigan, MD  Admit date: 09/24/2020 Discharge date: 09/28/2020  Admitted From: [facility].  Disposition:  SNF  Recommendations for Outpatient Follow-up:  1. Follow up with PCP in 1-2 weeks 2. Please obtain BMP/CBC in one week 3. Please follow up with palliative as outpatient.     Discharge Condition:GUARDED CODE STATUS: DNR Diet recommendation: Heart Healthy   Brief/Interim Summary:  87 year old lady prior history of atrial fibrillation not on anticoagulation due to anemia, chronic diastolic heart failure, chronic respiratory failure with hypoxia on 2 L of nasal cannula oxygen at home, stage IV CKD, hypertension, hyperlipidemia, anemia of chronic disease presents to ED for shortness of breath.  She was admitted for acute on chronic diastolic heart failure as her chest x-ray showed pulmonary vascular congestion, pulmonary edema and bilateral pleural effusions. She has been diuresed appropriately.   Discharge Diagnoses:  Principal Problem:   Permanent atrial fibrillation with rapid ventricular response (HCC) Active Problems:   Essential hypertension   Anemia of chronic disease   Chronic respiratory failure with hypoxia (HCC)   Acute on chronic diastolic CHF (congestive heart failure) (HCC)   Acute kidney injury superimposed on CKD (HCC)   Acute on chronic diastolic heart failure secondary to missing her medications for about 3 weeks. Appropriate diuresis with IV Lasix 20 mg twice daily.  Slight increase in creatinine with diuresis. She appears euvolemic today,.  Strict intake and output and daily weights.  held  Lasix  For one day and repeat creatinine in the morning shows improvement.  Recommend outpatient follow-up with cardiology. Discharged her on oral lasix 20 mg daily.     Mild AKI on stage 4 CKD Creatinine appears to have improved and close to baseline.     Hypertension:  Well controlled BP parameters.    Chronic respiratory failure with hypoxic on 2 lit at home.  Continue the same.   Anemia of chronic disease:  Hemoglobin stable between 7 to 8.    Mild thrombocytopenia:  Improving.    Permanent atrial fib  Rate well controlled.    In view of multiple medical problems, palliative care consulted for goals of care.      Discharge Instructions  Discharge Instructions    Diet - low sodium heart healthy   Complete by: As directed    Discharge instructions   Complete by: As directed    Follow up with PCP in one week.     Allergies as of 09/28/2020      Reactions   Sulfa Antibiotics    Information received from [place] pt profile.      Medication List    STOP taking these medications   predniSONE 20 MG tablet Commonly known as: DELTASONE   torsemide 20 MG tablet Commonly known as: DEMADEX     TAKE these medications   albuterol 108 (90 Base) MCG/ACT inhaler Commonly known as: VENTOLIN HFA Inhale 2 puffs into the lungs every 6 (six) hours as needed for wheezing or shortness of breath.   cyanocobalamin 1000 MCG tablet Take 1,000 mcg by mouth daily.   diltiazem 180 MG 24 hr capsule Commonly known as: TIAZAC Take 180 mg by mouth daily.   furosemide 20 MG tablet Commonly known as: LASIX Take 20 mg by mouth daily.   metoprolol tartrate 25 MG tablet Commonly known as: LOPRESSOR Take 25 mg by mouth 2 (two) times daily.       Contact  information for follow-up providers    [place] Follow up on 10/13/2020.   Specialty: Cardiology Why: at 1:00pm. Enter through the [place] entrance Contact information: [place] [phone]           Contact information for after-discharge care    Destination    [facility] Preferred SNF .   Service: Skilled  Nursing Contact information: [place] [phone]                 Allergies  Allergen Reactions  . Sulfa Antibiotics     Information received from [place] pt profile.    Consultations:  Palliative care.    Procedures/Studies: DG Chest Portable 1 View  Result Date: 09/24/2020 CLINICAL DATA:  Worsening weakness and shortness of breath. EXAM: PORTABLE CHEST 1 VIEW COMPARISON:  08/01/2020 FINDINGS: Cardiac enlargement. Diffuse pulmonary vascular congestion with perihilar infiltration, likely edema. Bilateral pleural effusions with basilar atelectasis, greater on the left. Progression since previous study. Calcified and tortuous aorta. Degenerative changes in the spine and shoulders. IMPRESSION: Cardiac enlargement, pulmonary vascular congestion, perihilar edema, and bilateral pleural effusions. Electronically Signed   By: Lucienne Capers M.D.   On: 09/24/2020 23:45       Subjective: No new complaints.   Discharge Exam: Vitals:   09/28/20 0743 09/28/20 1200  BP: 109/71 101/64  Pulse: 100 100  Resp: 15 15  Temp: 98.9 F (37.2 C) 98.8 F (37.1 C)  SpO2: 98% 100%   Vitals:   09/27/20 1933 09/28/20 0500 09/28/20 0743 09/28/20 1200  BP: (!) 94/59  109/71 101/64  Pulse: 84  100 100  Resp: '19  15 15  '$ Temp: 97.8 F (36.6 C)  98.9 F (37.2 C) 98.8 F (37.1 C)  TempSrc: Oral  Oral   SpO2: 94%  98% 100%  Weight:  84 kg    Height:        General: Pt is alert, awake, not in acute distress Cardiovascular: RRR, S1/S2 +, no rubs, no gallops Respiratory: CTA bilaterally, no wheezing, no rhonchi Abdominal: Soft, NT, ND, bowel sounds + Extremities: no edema, no cyanosis    The results of significant diagnostics from this hospitalization (including imaging, microbiology, ancillary and laboratory) are listed below for reference.     Microbiology: Recent Results (from the past 240 hour(s))  SARS Coronavirus 2 by RT PCR  (hospital order, performed in [facility] hospital lab) Nasopharyngeal Nasopharyngeal Swab     Status: None   Collection Time: 09/25/20  1:14 AM   Specimen: Nasopharyngeal Swab  Result Value Ref Range Status   SARS Coronavirus 2 NEGATIVE NEGATIVE Final    Comment: (NOTE) SARS-CoV-2 target nucleic acids are NOT DETECTED.  The SARS-CoV-2 RNA is generally detectable in upper and lower respiratory specimens during the acute phase of infection. The lowest concentration of SARS-CoV-2 viral copies this assay can detect is 250 copies / mL. A negative result does not preclude SARS-CoV-2 infection and should not be used as the sole basis for treatment or other patient management decisions.  A negative result may occur with improper specimen collection / handling, submission of specimen other than nasopharyngeal swab, presence of viral mutation(s) within the areas targeted by this assay, and inadequate number of viral copies (<250 copies / mL). A negative result must be combined with clinical observations, patient history, and epidemiological information.  Fact Sheet for Patients:  StrictlyIdeas.no  Fact Sheet for Healthcare Providers: BankingDealers.co.za  This test is not yet approved or  cleared by the [place] FDA and has been authorized for detection and/or diagnosis of SARS-CoV-2 by FDA under an Emergency Use Authorization (EUA).  This EUA will remain in effect (meaning this test can be used) for the duration of the COVID-19 declaration under Section 564(b)(1) of the Act, 21 U.S.C. section 360bbb-3(b)(1), unless the authorization is terminated or revoked sooner.  Performed at [facility], [address] [place], [place] [address]      Labs: BNP (last 3 results) Recent Labs    07/18/20 1228 08/01/20 2111 09/24/20 1952  BNP 651.0* 616.3* 123456*   Basic Metabolic Panel: Recent Labs  Lab 09/24/20 1952  09/25/20 0321 09/26/20 0350 09/27/20 0600 09/28/20 0911  NA 141 144 142 142 140  K 5.2* 5.2* 4.6 4.5 4.7  CL 106 107 105 105 105  CO2 '25 26 26 25 27  '$ GLUCOSE 96 85 90 93 92  BUN 59* 62* 64* 60* 57*  CREATININE 2.78* 2.81* 2.78* 2.90* 2.61*  CALCIUM 9.3 9.0 8.7* 9.1 8.8*  MG  --  2.3 2.1  --   --    Liver Function Tests: Recent Labs  Lab 09/24/20 2342  AST 16  ALT 6  ALKPHOS 43  BILITOT 1.0  PROT 7.2  ALBUMIN 3.7   Recent Labs  Lab 09/24/20 2342  LIPASE 50   No results for input(s): AMMONIA in the last 168 hours. CBC: Recent Labs  Lab 09/24/20 1952 09/25/20 0321 09/26/20 0350 09/27/20 0600  WBC 4.8 4.9 5.3 5.8  HGB 8.7* 7.7* 7.5* 8.5*  HCT 28.3* 26.2* 25.8* 28.4*  MCV 97.9 99.2 99.6 99.6  PLT 131* 113* 114* 144*   Cardiac Enzymes: No results for input(s): CKTOTAL, CKMB, CKMBINDEX, TROPONINI in the last 168 hours. BNP: Invalid input(s): POCBNP CBG: No results for input(s): GLUCAP in the last 168 hours. D-Dimer No results for input(s): DDIMER in the last 72 hours. Hgb A1c No results for input(s): HGBA1C in the last 72 hours. Lipid Profile No results for input(s): CHOL, HDL, LDLCALC, TRIG, CHOLHDL, LDLDIRECT in the last 72 hours. Thyroid function studies No results for input(s): TSH, T4TOTAL, T3FREE, THYROIDAB in the last 72 hours.  Invalid input(s): FREET3 Anemia work up No results for input(s): VITAMINB12, FOLATE, FERRITIN, TIBC, IRON, RETICCTPCT in the last 72 hours. Urinalysis    Component Value Date/Time   COLORURINE YELLOW (A) 09/24/2016 0857   APPEARANCEUR CLEAR (A) 09/24/2016 0857   LABSPEC 1.010 09/24/2016 0857   PHURINE 5.0 09/24/2016 0857   GLUCOSEU NEGATIVE 09/24/2016 0857   HGBUR SMALL (A) 09/24/2016 0857   BILIRUBINUR NEGATIVE 09/24/2016 0857   KETONESUR NEGATIVE 09/24/2016 0857   PROTEINUR NEGATIVE 09/24/2016 0857   NITRITE NEGATIVE 09/24/2016 0857   LEUKOCYTESUR NEGATIVE 09/24/2016 0857   Sepsis Labs Invalid input(s):  PROCALCITONIN,  WBC,  LACTICIDVEN Microbiology Recent Results (from the past 240 hour(s))  SARS Coronavirus 2 by RT PCR (hospital order, performed in [place] hospital lab) Nasopharyngeal Nasopharyngeal Swab     Status: None   Collection Time: 09/25/20  1:14 AM   Specimen: Nasopharyngeal Swab  Result Value Ref Range Status   SARS Coronavirus 2 NEGATIVE NEGATIVE Final    Comment: (NOTE) SARS-CoV-2 target nucleic acids are NOT DETECTED.  The SARS-CoV-2 RNA is generally detectable in upper and lower respiratory specimens during the acute phase of infection. The lowest concentration of SARS-CoV-2 viral copies this assay can  detect is 250 copies / mL. A negative result does not preclude SARS-CoV-2 infection and should not be used as the sole basis for treatment or other patient management decisions.  A negative result may occur with improper specimen collection / handling, submission of specimen other than nasopharyngeal swab, presence of viral mutation(s) within the areas targeted by this assay, and inadequate number of viral copies (<250 copies / mL). A negative result must be combined with clinical observations, patient history, and epidemiological information.  Fact Sheet for Patients:   StrictlyIdeas.no  Fact Sheet for Healthcare Providers: BankingDealers.co.za  This test is not yet approved or  cleared by the [place] FDA and has been authorized for detection and/or diagnosis of SARS-CoV-2 by FDA under an Emergency Use Authorization (EUA).  This EUA will remain in effect (meaning this test can be used) for the duration of the COVID-19 declaration under Section 564(b)(1) of the Act, 21 U.S.C. section 360bbb-3(b)(1), unless the authorization is terminated or revoked sooner.  Performed at [facility], [address], [place], [place] [address]      Time coordinating discharge: 32 minutes.  SIGNED:   Hosie Poisson, MD  [facility] 09/28/2020, 12:39 PM

## 2020-09-28 NOTE — TOC Progression Note (Signed)
Transition of Care [facility]) - Progression Note    Patient Details  Name: [place] Matczak MRN: [id number] Date of Birth: 1928/10/31  Transition of Care [facility]) CM/SW Contact  Eileen Stanford, LCSW Phone Number: 09/28/2020, 9:57 AM  Clinical Narrative:  Bed offer made from [facility]. Spoke with pt's Son. Bed offer accepted. 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 Need updated Covid and [facility] can take pt when that is done. MD notified.     Expected Discharge Plan: [place] Barriers to Discharge: Continued Medical Work up,SNF Pending bed offer  Expected Discharge Plan and Services Expected Discharge Plan: [place] Choice: [place] (Preferred SNF - [facility] or [facility]) Living arrangements for the past 2 months: Mobile Home                                       Social Determinants of Health (SDOH) Interventions    Readmission Risk Interventions No flowsheet data found.

## 2020-09-28 NOTE — Progress Notes (Signed)
Multiple attempts made to give report to [facility] was on hold for over 30 minutes then got disconnected. Called again with same results. Report to be given to EMS

## 2020-09-28 NOTE — TOC Transition Note (Signed)
.  Transition of Care [facility]) - CM/SW Discharge Note   Patient Details  Name: Felicia [name] MRN: [id number] Date of Birth: [birth year]  Transition [facility]) CM/SW Contact:  Eileen Stanford, LCSW Phone Number: 09/28/2020, 4:03 PM   Clinical Narrative:   Clinical Social Worker facilitated patient discharge including contacting patient family and facility to confirm patient discharge plans.  Clinical information faxed to facility and family agreeable with plan.  CSW arranged ambulance transport via [facility] to [facility] .  RN to call [phone] for report prior to discharge.     Final next level of care: [place] Barriers to Discharge: No Barriers Identified   Patient Goals and CMS Choice        Discharge Placement              Patient chooses bed at:  ([facility]) Patient to be transferred to facility by: [facility]   Patient and family notified of of transfer: 09/28/20  Discharge Plan and [place] Choice: [place] (Preferred SNF - [facility] or [facility])                               Social Determinants of Health (SDOH) Interventions     Readmission Risk Interventions No flowsheet data found.

## 2020-09-28 NOTE — Progress Notes (Signed)
4th attempt made to call report to [facility]. Disconnected again. EMS here to transport patient.

## 2020-10-01 ENCOUNTER — Other Ambulatory Visit: Payer: Self-pay | Admitting: Hematology and Oncology

## 2020-10-01 DIAGNOSIS — D631 Anemia in chronic kidney disease: Secondary | ICD-10-CM

## 2020-10-02 ENCOUNTER — Inpatient Hospital Stay: Payer: Medicare Other

## 2020-10-02 ENCOUNTER — Inpatient Hospital Stay: Payer: Medicare Other | Attending: Hematology and Oncology | Admitting: Hematology and Oncology

## 2020-10-02 ENCOUNTER — Telehealth: Payer: Self-pay | Admitting: Hematology and Oncology

## 2020-10-02 DIAGNOSIS — D72821 Monocytosis (symptomatic): Secondary | ICD-10-CM

## 2020-10-02 DIAGNOSIS — D631 Anemia in chronic kidney disease: Secondary | ICD-10-CM

## 2020-10-02 NOTE — Telephone Encounter (Signed)
Left VM for patient to contact office to r\s missed 1/31 appt.

## 2020-10-13 ENCOUNTER — Ambulatory Visit: Payer: Medicare Other | Admitting: Family

## 2020-10-15 ENCOUNTER — Emergency Department: Payer: Medicare Other

## 2020-10-15 ENCOUNTER — Emergency Department
Admission: EM | Admit: 2020-10-15 | Discharge: 2020-10-15 | Disposition: A | Discharge status: Home / Self Care | Payer: Medicare Other | Attending: Emergency Medicine | Admitting: Emergency Medicine

## 2020-10-15 ENCOUNTER — Other Ambulatory Visit: Payer: Self-pay

## 2020-10-15 DIAGNOSIS — S0181XA Laceration without foreign body of other part of head, initial encounter: Secondary | ICD-10-CM | POA: Diagnosis present

## 2020-10-15 DIAGNOSIS — W1830XA Fall on same level, unspecified, initial encounter: Secondary | ICD-10-CM | POA: Diagnosis not present

## 2020-10-15 DIAGNOSIS — I130 Hypertensive heart and chronic kidney disease with heart failure and stage 1 through stage 4 chronic kidney disease, or unspecified chronic kidney disease: Secondary | ICD-10-CM | POA: Insufficient documentation

## 2020-10-15 DIAGNOSIS — N184 Chronic kidney disease, stage 4 (severe): Secondary | ICD-10-CM | POA: Diagnosis not present

## 2020-10-15 DIAGNOSIS — I5033 Acute on chronic diastolic (congestive) heart failure: Secondary | ICD-10-CM | POA: Diagnosis not present

## 2020-10-15 DIAGNOSIS — I2510 Atherosclerotic heart disease of native coronary artery without angina pectoris: Secondary | ICD-10-CM | POA: Diagnosis not present

## 2020-10-15 DIAGNOSIS — W19XXXA Unspecified fall, initial encounter: Secondary | ICD-10-CM

## 2020-10-15 DIAGNOSIS — S0083XA Contusion of other part of head, initial encounter: Secondary | ICD-10-CM

## 2020-10-15 DIAGNOSIS — Z79899 Other long term (current) drug therapy: Secondary | ICD-10-CM | POA: Diagnosis not present

## 2020-10-15 NOTE — ED Notes (Signed)
Patient provided with 2 warm blankets per request. Positioned in bed for comfort with cardiac monitor in place. Bed low and locked with side rails raised x2 and call bell in reach.

## 2020-10-15 NOTE — ED Triage Notes (Signed)
Pt reports rolling out of bed. Presents with ~0.5 in lac above L eyebrow. Presents with bandage in place and bleeding controlled. Denies LOC. Pupils equal and reactive. Pt alert and oriented x4 and following all commands appropriately. Denies h/a or vision change. Reports some dizziness with position change. Denies emesis after hitting head. Denies any numbness or tingling.

## 2020-10-15 NOTE — ED Notes (Signed)
EMS transported pt back to [facility] before this RN was able to update VS and obtain e-signature.

## 2020-10-15 NOTE — Discharge Instructions (Signed)

## 2020-10-15 NOTE — ED Provider Notes (Signed)
[facility] Emergency Department Provider Note  ____________________________________________  Time seen: Approximately 4:51 AM  I have reviewed the triage vital signs and the nursing notes.   HISTORY  Chief Complaint Fall   HPI Felicia [name] is a 87 y.o. female with a history of A. fib not on anticoagulation, anemia, CHF, CKD, CAD, hypertension, hyperlipidemia who presents for evaluation of a mechanical fall.  Patient reports that she was asleep when she rolled out of bed.  She felt like she was having a dream and somebody pushed her out of bed.  She has full recollection of the event and no history of dementia.  She denies any pain.  She does have a very small superficial laceration over her right forehead.  She denies headache or neck pain, no extremity pain, no back pain, no chest pain, no abdominal pain.   Past Medical History:  Diagnosis Date  . Anemia   . Atrial fibrillation ([employer])   . CHF (congestive heart failure) ([place])   . CKD (chronic kidney disease), stage IV ([place])   . Coronary artery disease   . GERD (gastroesophageal reflux disease)   . HOH (hard of hearing)   . Hyperlipidemia   . Hypertension   . LVH (left ventricular hypertrophy) due to hypertensive disease   . Pneumonia   . Pulmonary hypertension ([place])   . PVD (peripheral vascular disease) ([place])   . Renal disorder   . Vertigo     Patient Active Problem List   Diagnosis Date Noted  . Permanent atrial fibrillation with rapid ventricular response ([place]) 09/25/2020  . Acute kidney injury superimposed on CKD ([place]) 09/25/2020  . Dyspnea 08/06/2020  . Atrial fibrillation, chronic ([place])   . Acute on chronic diastolic CHF (congestive heart failure) ([place])   . Permanent atrial fibrillation ([place])   . Acquired thrombophilia ([place])   . Weakness   . Chronic respiratory failure with hypoxia ([place]) 07/18/2020  . Atrial fibrillation with RVR ([place]) 07/18/2020  . Monocytosis 10/03/2019  .  Normocytic anemia 09/17/2019  . Hyperlipidemia, mixed 09/17/2019  . Peripheral vascular disease ([place]) 09/17/2019  . Anemia in stage 4 chronic kidney disease ([place]) 09/17/2019  . ANA positive 09/01/2019  . Flat feet, bilateral 09/01/2019  . CKD (chronic kidney disease), stage IV ([place]) 09/01/2019  . Primary osteoarthritis of hand 09/01/2019  . Dizziness 02/24/2019  . LVH (left ventricular hypertrophy) due to hypertensive disease, without heart failure 01/28/2018  . HCAP (healthcare-associated pneumonia) 09/24/2016  . Sepsis ([place]) 09/12/2016  . Community acquired pneumonia 09/12/2016  . Hypoxia 09/12/2016  . Pleuritic chest pain 09/12/2016  . Pneumonia 09/12/2016  . Essential hypertension 01/05/2015  . Mild aortic valve stenosis 10/20/2014  . Moderate mitral insufficiency 10/20/2014  . Moderate tricuspid insufficiency 10/20/2014  . Chronic pulmonary hypertension ([place]) 09/22/2014  . Venous insufficiency of both lower extremities 09/22/2014  . CTS (carpal tunnel syndrome) 03/21/2014    Past Surgical History:  Procedure Laterality Date  . APPENDECTOMY  1939  . TONSILLECTOMY  1939    Prior to Admission medications   Medication Sig Start Date End Date Taking? Authorizing Provider  albuterol (VENTOLIN HFA) 108 (90 Base) MCG/ACT inhaler Inhale 2 puffs into the lungs every 6 (six) hours as needed for wheezing or shortness of breath. 07/24/20   Loletha Grayer, MD  cyanocobalamin 1000 MCG tablet Take 1,000 mcg by mouth daily.    [provider]  diltiazem (TIAZAC) 180 MG 24 hr capsule Take 180 mg by mouth daily.  [provider]  furosemide (LASIX) 20 MG tablet Take 20 mg by mouth daily.    [provider]  metoprolol tartrate (LOPRESSOR) 25 MG tablet Take 25 mg by mouth 2 (two) times daily. 08/02/20   [provider]    Allergies Sulfa antibiotics  Family History  Problem Relation Age of Onset  . Heart disease Mother   . Goiter Mother   .  Bladder Cancer Father 28    Social History Social History   Tobacco Use  . Smoking status: Never Smoker  . Smokeless tobacco: Never Used  Vaping Use  . Vaping Use: Never used  Substance Use Topics  . Alcohol use: No  . Drug use: No    Review of Systems  Constitutional: Negative for fever. Eyes: Negative for visual changes. ENT: Negative for facial injury or neck injury Cardiovascular: Negative for chest injury. Respiratory: Negative for shortness of breath. Negative for chest wall injury. Gastrointestinal: Negative for abdominal pain or injury. Genitourinary: Negative for dysuria. Musculoskeletal: Negative for back injury, negative for arm or leg pain. Skin: + laceration Neurological: Negative for head injury.   ____________________________________________   PHYSICAL EXAM:  VITAL SIGNS: ED Triage Vitals  Enc Vitals Group     BP --      Pulse Rate 10/15/20 0449 90     Resp 10/15/20 0449 17     Temp --      Temp src --      SpO2 10/15/20 0449 99 %     Weight 10/15/20 0447 183 lb (83 kg)     Height 10/15/20 0447 '5\' 9"'$  (1.753 m)     Head Circumference --      Peak Flow --      Pain Score 10/15/20 0447 0     Pain Loc --      Pain Edu? --      Excl. in [place]? --     Full spinal precautions maintained throughout the trauma exam. Constitutional: Alert and oriented. No acute distress. Does not appear intoxicated. HEENT Head: Normocephalic and atraumatic. Face: No facial bony tenderness. Small bruising of the R forehead with a superficial 0.5cm laceration next to her R eyebrow. Stable midface Ears: No hemotympanum bilaterally. No Battle sign Eyes: No eye injury. PERRL. No raccoon eyes Nose: Nontender. No epistaxis. No rhinorrhea Mouth/Throat: Mucous membranes are moist. No oropharyngeal blood. No dental injury. Airway patent without stridor. Normal voice. Neck: no C-collar. No midline c-spine tenderness.  Cardiovascular: Normal rate, regular rhythm. Normal and  symmetric distal pulses are present in all extremities. Pulmonary/Chest: Chest wall is stable and nontender to palpation/compression. Normal respiratory effort. Breath sounds are normal. No crepitus.  Abdominal: Soft, nontender, non distended. Musculoskeletal: Nontender with normal full range of motion in all extremities. No deformities. No thoracic or lumbar midline spinal tenderness. Pelvis is stable. Skin: Skin is warm, dry and intact. No abrasions or contutions. Psychiatric: Speech and behavior are appropriate. Neurological: Normal speech and language. Moves all extremities to command. No gross focal neurologic deficits are appreciated.  [place] Coma Score: 4 - Opens eyes on own 6 - Follows simple motor commands 5 - Alert and oriented GCS: 15   ____________________________________________   LABS (all labs ordered are listed, but only abnormal results are displayed)  Labs Reviewed - No data to display ____________________________________________  EKG  ED ECG REPORT I, Rudene Re, the attending physician, personally viewed and interpreted this ECG.  Atrial fibrillation with rate of 74, normal QTC, no ST  elevations or depressions. ____________________________________________  RADIOLOGY  I have personally reviewed the images performed during this visit and I agree with the Radiologist's read.   Interpretation by Radiologist:  CT Head Wo Contrast  Result Date: 10/15/2020 CLINICAL DATA:  87 year old female status post fall out of bed. Laceration above left eye. Dizziness. EXAM: CT HEAD WITHOUT CONTRAST TECHNIQUE: Contiguous axial images were obtained from the base of the skull through the vertex without intravenous contrast. COMPARISON:  Brain MRI 04/28/2017.  Head CT 04/28/2017. FINDINGS: Brain: Mild generalized cerebral volume loss since 2018. No midline shift, ventriculomegaly, mass effect, evidence of mass lesion, intracranial hemorrhage or evidence of cortically based  acute infarction. Patchy left greater than right cerebral white matter hypodensity has not significantly changed. No cortical encephalomalacia identified. Vascular: Calcified atherosclerosis at the skull base. No suspicious intracranial vascular hyperdensity. Skull: No skull fracture identified. Progressed left TMJ degeneration. No acute osseous abnormality identified. Sinuses/Orbits: Visualized paranasal sinuses and mastoids are stable and well pneumatized. Other: Right forehead broad-based mild scalp hematoma (series 3, image 43). No soft tissue gas identified. Visible orbits soft tissues appears stable. IMPRESSION: 1. Right forehead scalp hematoma without underlying skull fracture. 2. No acute intracranial abnormality. Stable CT appearance of white matter disease since 2018. Electronically Signed   By: Genevie Ann M.D.   On: 10/15/2020 05:50      ____________________________________________   PROCEDURES  Procedure(s) performed:yes .Marland KitchenLaceration Repair  Date/Time: 10/15/2020 4:53 AM Performed by: Rudene Re, MD Authorized by: Rudene Re, MD   Consent:    Consent obtained:  Verbal   Consent given by:  Patient   Risks discussed:  Infection, pain, retained foreign body, poor cosmetic result and poor wound healing Anesthesia:    Anesthesia method:  None Laceration details:    Location:  Face   Face location:  R eyebrow   Length (cm):  0.5 Pre-procedure details:    Preparation:  Patient was prepped and draped in usual sterile fashion Exploration:    Hemostasis achieved with:  Direct pressure   Wound extent: no fascia violation noted, no foreign bodies/material noted, no muscle damage noted, no nerve damage noted, no tendon damage noted, no underlying fracture noted and no vascular damage noted     Contaminated: no   Treatment:    Area cleansed with:  Saline   Amount of cleaning:  Standard   Irrigation solution:  Sterile saline   Visualized foreign bodies/material removed:  no   Skin repair:    Repair method:  Tissue adhesive and Steri-Strips   Number of Steri-Strips:  2 Approximation:    Approximation:  Close Repair type:    Repair type:  Simple Post-procedure details:    Dressing:  Open (no dressing)   Procedure completion:  Tolerated well, no immediate complications .1-3 Lead EKG Interpretation Performed by: Rudene Re, MD Authorized by: Rudene Re, MD     Interpretation: abnormal     ECG rate assessment: normal     Rhythm: atrial fibrillation     Ectopy: none       Critical Care performed:  None ____________________________________________   INITIAL IMPRESSION / ASSESSMENT AND PLAN / ED COURSE   87 y.o. female with a history of A. fib not on anticoagulation, anemia, CHF, CKD, CAD, hypertension, hyperlipidemia who presents for evaluation of a mechanical fall after rolling out of bed this morning.  She has a very small superficial laceration by her right eyebrow which was repaired per procedure note above.  We will get an EKG  and CT of the head.  She has no history of dementia and remembers the whole episode therefore no need for lab work at this time.  I offered Tylenol but she reports no pain and declined.  She is otherwise extremely well-appearing and in no distress with normal vital signs and no other signs of traumatic injury.  _________________________ 5:56 AM on 10/15/2020 -----------------------------------------  EKG showing A. fib with well-controlled rate which is known to patient.  CT head visualized by me with no acute intracranial injuries, confirmed by radiology.  Patient stable for discharge back to her rehab facility     ____________________________________________  Please note:  Patient was evaluated in Emergency Department today for the symptoms described in the history of present illness. Patient was evaluated in the context of the global COVID-19 pandemic, which necessitated consideration that the patient  might be at risk for infection with the SARS-CoV-2 virus that causes COVID-19. Institutional protocols and algorithms that pertain to the evaluation of patients at risk for COVID-19 are in a state of rapid change based on information released by regulatory bodies including the CDC and federal and state organizations. These policies and algorithms were followed during the patient's care in the ED.  Some ED evaluations and interventions may be delayed as a result of limited staffing during the pandemic.   ____________________________________________   FINAL CLINICAL IMPRESSION(S) / ED DIAGNOSES   Final diagnoses:  Fall, initial encounter  Traumatic hematoma of forehead, initial encounter  Laceration of forehead, initial encounter      NEW MEDICATIONS STARTED DURING THIS VISIT:  ED Discharge Orders    None       Note:  This document was prepared using Dragon voice recognition software and may include unintentional dictation errors.    Alfred Levins, [place], MD 10/15/20 [phone]

## 2020-10-15 NOTE — ED Notes (Addendum)
MD at bedside cleaning wound to L eyebrow and applying steristrips.

## 2020-10-17 ENCOUNTER — Other Ambulatory Visit: Payer: Self-pay

## 2020-10-17 DIAGNOSIS — D631 Anemia in chronic kidney disease: Secondary | ICD-10-CM

## 2020-10-17 NOTE — Progress Notes (Signed)
[facility]  [address] [place], [place] [address] Phone: [phone]  Fax: [phone]   Clinic Day:  10/18/2020  Referring physician: Sofie Hartigan, MD  Chief Complaint: Felicia [name] is a 87 y.o. female with anemia in chronic kidney disease on Retacrit who is seen for 2 month assessment.  HPI: The patient was last seen in the hematology clinic on 07/17/2020. At that time, she was very shortness of breath.  Lower extremity swelling had improved.  She was urinating less than usual.  Appetite was poor.  Exam revealed JVD, decreased breath sounds at the bases, and an irregular rhythm.  Hemoglobin was 8.2.  She was referred to the ER.  She was admitted to [facility] from 07/18/2020 - 07/24/2020 with acute on chronic hypoxic respiratory failure.  O2 sats dropped to 87% on ambulation.  She was discharged on home oxygen.  She had acute on chronic diastolic congestive heart failure.  She received Lasix.  She was prescribed low-dose Lasix on discharge.  She had atrial fibrillation.  She was to continue Coreg and Cardizem CD for rate control.  No anticoagulation recommended.  He was discharged with home health.  She was admitted to [facility] from 09/24/2020 - 09/28/2020 with acute on chronic diastolic heart failure.  CXR revealed pulmonary vascular congestion, pulmonary edema and bilateral pleural effusions.  Etiology was felt secondary to missed medications for 3 weeks.  He was diuresed with Lasix 20 mg twice daily with resultant slight increase in creatinine.  She was discharged to maintain strict input and output and daily weights.  She was discharged on Lasix 20 mg a day.  She continued oxygen 2 L/min.  Hemoglobin was stable between 7 - 8.  The patient went to the [facility] ER on 10/15/2020 after rolling out of her bed. A very small superficial laceration near her right eyebrow was repaired. Head CT showed a right forehead scalp hematoma without underlying skull  fracture. There was no acute intracranial abnormality. There was stable CT appearance of white matter disease since 2018. The patient was offered Tylenol but she declined because she had no pain.  She received Retacrit 20,000 units on 08/07/2020, 08/21/2020 and 09/04/2020.  CBC has been followed: 07/24/2020:  Hematocrit 25.9.  Hemoglobin 8.2.  MCV 94.9. 08/01/2020:  Hematocrit 25.1.  Hemoglobin 7.8.  MCV 93.3. 08/07/2020:  Hematocrit 24.6.  Hemoglobin 7.9. 08/21/2020:  Hematocrit 25.7.  Hemoglobin 8.0. 09/27/2020:  Hematocrit 28.4.  Hemoglobin 8.5.  MCV 99.6.  During the interim, she has been "real good." She does not have any pain and is breathing well. Her energy is good. She is on 2 liters/min oxygen. The patient states that she passes out if she has to lay flat on her back. Her leg swelling started worsening 4-5 days ago. Sometimes she is unable to lift her legs onto her bed because they are too heavy. She denies bleeding of any kind.  The patient has trouble standing up from a seated position due to weakness in her arms and legs.  The patient lives alone and was responsible for missing her medications. She ran out and states that the bottle said she had no refills. The patient's son is planning on getting someone to come sit with her during the day.  She does not think that she has ever had a blood transfusion.   Past Medical History:  Diagnosis Date  . Anemia   . Atrial fibrillation ([place])   . CHF (congestive heart failure) ([place])   .  CKD (chronic kidney disease), stage IV ([place])   . Coronary artery disease   . GERD (gastroesophageal reflux disease)   . HOH (hard of hearing)   . Hyperlipidemia   . Hypertension   . LVH (left ventricular hypertrophy) due to hypertensive disease   . Pneumonia   . Pulmonary hypertension ([place])   . PVD (peripheral vascular disease) ([place])   . Renal disorder   . Vertigo     Past Surgical History:  Procedure Laterality Date  . APPENDECTOMY  1939  .  TONSILLECTOMY  1939    Family History  Problem Relation Age of Onset  . Heart disease Mother   . Goiter Mother   . Bladder Cancer Father 103    Social History:  reports that she has never smoked. She has never used smokeless tobacco. She reports that she does not drink alcohol and does not use drugs. Has had no exposure to any radiation or toxins. Her husband started [facility] in [place]. After he passed she went to work for [employer] for 23 years. 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 She lives by herself with 2 cats named brother and sister. She lives in [place]. She has 1 son who lives in [place]. She recently had a new great granddaughter on 11/25/2019. Her son is Felicia [name]. The patient is accompanied by her son, Felicia [name], today.  Allergies:  Allergies  Allergen Reactions  . Sulfa Antibiotics     Information received from [place] pt profile.    Current Medications: Current Outpatient Medications  Medication Sig Dispense Refill  . albuterol (VENTOLIN HFA) 108 (90 Base) MCG/ACT inhaler Inhale 2 puffs into the lungs every 6 (six) hours as needed for wheezing or shortness of breath. 18 g 0  . furosemide (LASIX) 20 MG tablet Take 20 mg by mouth daily.    . metoprolol tartrate (LOPRESSOR) 25 MG tablet Take 25 mg by mouth 2 (two) times daily.    . Skin Protectants, Misc. (EUCERIN) cream Apply topically as needed for dry skin.    . cyanocobalamin 1000 MCG tablet Take 1,000 mcg by mouth daily. (Patient not taking: Reported on 10/18/2020)    . diltiazem (TIAZAC) 180 MG 24 hr capsule Take 180 mg by mouth daily. (Patient not taking: Reported on 10/18/2020)     No current facility-administered medications for this visit.    Review of Systems  Constitutional: Positive for weight loss (12 lbs). Negative for chills, diaphoresis, fever and malaise/fatigue.       Feels "real good." Energy level is good.  HENT: Negative for  congestion, ear discharge, ear pain, hearing loss, nosebleeds, sinus pain, sore throat and tinnitus.   Eyes: Negative for blurred vision.  Respiratory: Negative for cough, hemoptysis, sputum production and shortness of breath.        On 2 liters/min oxygen  Cardiovascular: Positive for orthopnea and leg swelling. Negative for chest pain and palpitations.  Gastrointestinal: Negative for abdominal pain, blood in stool, constipation, diarrhea, heartburn, melena, nausea and vomiting.  Genitourinary: Negative for dysuria, flank pain, frequency, hematuria and urgency.  Musculoskeletal: Negative for back pain, joint pain, myalgias and neck pain.  Skin: Negative for itching and rash.  Neurological: Positive for weakness (generalized). Negative for dizziness, tingling, sensory change and headaches.  Endo/Heme/Allergies: Does not bruise/bleed easily.  Psychiatric/Behavioral: Negative for depression and memory loss. The patient is not nervous/anxious and does not have insomnia.   All other  systems reviewed and are negative.  Performance status (ECOG): 2  Vitals Blood pressure 123/78, pulse (!) 108, temperature (!) 97.2 F (36.2 C), temperature source Tympanic, resp. rate 18, weight 190 lb 9.1 oz (86.4 kg), SpO2 90 %.   Physical Exam Vitals and nursing note reviewed.  Constitutional:      General: She is not in acute distress.    Appearance: She is well-developed. She is not diaphoretic.     Interventions: Face mask in place.     Comments: Patient sitting comfortably in wheelchair in no acute distress. She was examined in the wheelchair.  HENT:     Head: Normocephalic and atraumatic.     Comments: Short dark gray styled hair.    Mouth/Throat:     Mouth: Mucous membranes are moist.     Pharynx: Oropharynx is clear. No oropharyngeal exudate.  Eyes:     General: No scleral icterus.    Extraocular Movements: Extraocular movements intact.     Conjunctiva/sclera: Conjunctivae normal.     Pupils:  Pupils are equal, round, and reactive to light.     Comments: Blue eyes.  Cardiovascular:     Rate and Rhythm: Normal rate and regular rhythm.     Heart sounds: Normal heart sounds. No murmur heard.   Pulmonary:     Effort: Pulmonary effort is normal.     Breath sounds: No rales.  Chest:     Chest wall: No tenderness.  Breasts:     Right: No axillary adenopathy or supraclavicular adenopathy.     Left: No axillary adenopathy or supraclavicular adenopathy.    Abdominal:     General: Bowel sounds are normal. There is no distension.     Palpations: Abdomen is soft. There is no hepatomegaly, splenomegaly or mass.     Tenderness: There is no abdominal tenderness. There is no guarding or rebound.  Musculoskeletal:        General: Tenderness (BLE) present. Normal range of motion.     Cervical back: Normal range of motion and neck supple.     Right lower leg: Edema (1+, chronic) present.     Left lower leg: Edema (1+, chronic, L>R) present.  Lymphadenopathy:     Head:     Right side of head: No preauricular, posterior auricular or occipital adenopathy.     Left side of head: No preauricular, posterior auricular or occipital adenopathy.     Cervical: No cervical adenopathy.     Upper Body:     Right upper body: No supraclavicular or axillary adenopathy.     Left upper body: No supraclavicular or axillary adenopathy.     Lower Body: No right inguinal adenopathy. No left inguinal adenopathy.  Skin:    General: Skin is warm and dry.     Comments: Bruise on right side of forehead s/p fall  Neurological:     Mental Status: She is alert and oriented to person, place, and time.  Psychiatric:        Behavior: Behavior normal.        Thought Content: Thought content normal.        Judgment: Judgment normal.    Appointment on 10/18/2020  Component Date Value Ref Range Status  . WBC 10/18/2020 6.1  4.0 - 10.5 K/uL Final  . RBC 10/18/2020 2.66* 3.87 - 5.11 MIL/uL Final  . Hemoglobin  10/18/2020 7.8* 12.0 - 15.0 g/dL Final  . HCT 10/18/2020 25.3* 36.0 - 46.0 % Final  . MCV 10/18/2020 95.1  80.0 - 100.0 fL Final  . MCH 10/18/2020 29.3  26.0 - 34.0 pg Final  . MCHC 10/18/2020 30.8  30.0 - 36.0 g/dL Final  . RDW 10/18/2020 16.5* 11.5 - 15.5 % Final  . Platelets 10/18/2020 122* 150 - 400 K/uL Final  . nRBC 10/18/2020 0.0  0.0 - 0.2 % Final   Performed at [facility], [address], [place], [place] [address]  . Neutrophils Relative % 10/18/2020 PENDING  % Incomplete  . Neutro Abs 10/18/2020 PENDING  1.7 - 7.7 K/uL Incomplete  . Band Neutrophils 10/18/2020 PENDING  % Incomplete  . Lymphocytes Relative 10/18/2020 PENDING  % Incomplete  . Lymphs Abs 10/18/2020 PENDING  0.7 - 4.0 K/uL Incomplete  . Monocytes Relative 10/18/2020 PENDING  % Incomplete  . Monocytes Absolute 10/18/2020 PENDING  0.1 - 1.0 K/uL Incomplete  . Eosinophils Relative 10/18/2020 PENDING  % Incomplete  . Eosinophils Absolute 10/18/2020 PENDING  0.0 - 0.5 K/uL Incomplete  . Basophils Relative 10/18/2020 PENDING  % Incomplete  . Basophils Absolute 10/18/2020 PENDING  0.0 - 0.1 K/uL Incomplete  . WBC Morphology 10/18/2020 PENDING   Incomplete  . RBC Morphology 10/18/2020 PENDING   Incomplete  . Smear Review 10/18/2020 PENDING   Incomplete  . Other 10/18/2020 PENDING  % Incomplete  . nRBC 10/18/2020 PENDING  0 /100 WBC Incomplete  . Metamyelocytes Relative 10/18/2020 PENDING  % Incomplete  . Myelocytes 10/18/2020 PENDING  % Incomplete  . Promyelocytes Relative 10/18/2020 PENDING  % Incomplete  . Blasts 10/18/2020 PENDING  % Incomplete  . Immature Granulocytes 10/18/2020 PENDING  % Incomplete  . Abs Immature Granulocytes 10/18/2020 PENDING  0.00 - 0.07 K/uL Incomplete    Assessment:  Felicia [name] is a 87 y.o. female with anemia of chronic kidney disease. Hemoglobin has ranged between 8.2 - 10.4 over the past 4 years.  Work-up on 12/03/2020revealed a hematocrit of 27.4,  hemoglobin 9.2, MCV 91.3, platelets 141,000, white count 7100 with an [place] of 3799. Monocyte countwas 1299. Iron saturation was 34% with a TIBC of 194. Creatinine was 2.15 (CrCl 23 ml/min). Phosphorus was 4.4 (elevated). Calcium was 8.5 with an albumen of 3.8. PTH was 102. ANA was + with a titer of 1:320 with cytoplasmic pattern and a dsDNA of 11 (+). SPEPrevealed a poorly defined band of restricted protein mobility in the gammaglobulin region.  Work-up on01/15/2021revealed a hematocrit26.7, hemoglobin8.9, MCV91.8, platelets130,000, WBC800, AP:[id number]. Normal studies included: B12 (700), folate(12.1), SPEP, and immunoglobulins.Kappa free light chain ratiowas1.75(0.26-1.75).Peripheral smearrevealed a normocytic anemia with mild thrombocytopenia. The morphology of the RBCs, WBCs, and platelets were within normal limits. There was no evidence of circulating blasts.  She has stage IV chronic kidney disease.  She receives Retacrit every 2 weeks (09/30/2019 - 01/06/2020).  Retacrit was increased from 10,000 units to 20,000 units on 01/06/2020 (last 09/04/2020).  She is not interested in receiving the COVID-19 vaccine at this time.   Symptomatically, energy is good. She is on 2 liters/min oxygen. She passes out if she has to lay flat on her back. She has increased leg swelling.  She denies bleeding of any kind.  The patient has trouble standing up from a seated position due to weakness in her arms and legs.  Plan: 1.   Labs today: CBC with diff, ferritin, iron studies, hold tube. 2. Anemia of chronic renal disease Hematocrit 25.3. Hemoglobin 7.8.MCV 95.1. Hemoglobin level remains below baseline. Ferritin 538 with an iron saturation of 15% and a TIBC of 207.  She last received Retacrit 20,000 units on 09/04/2020.  Retacrit today and every 2 weeks per protocol. 3. Monocytosis Monocyte count 1500 (24% of  WBC) today.   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 Consider bone marrow aspirate and biopsy. 4. Stage IV chronic kidney disease  Patient notes increased lower extremity edema.  She has a recent admission for acute on chronic CHF.    Discuss need to follow-up closely with nephrology. 5. Retacrit today. 6.   RTC in 2 weeks for labs (HCT/Hgb, hold tube, ferritin, iron studies) and +/- Retacrit. 7.   RTC in 4 weeks for MD assess, labs (CBC, Cr), and +/- Retacrit.  I discussed the assessment and treatment plan with the patient.  The patient was provided an opportunity to ask questions and all were answered.  The patient agreed with the plan and demonstrated an understanding of the instructions.  The patient was advised to call back if the symptoms worsen or if the condition fails to improve as anticipated.  I provided 18 minutes of face-to-face time during this this encounter and > 50% was spent counseling as documented under my assessment and plan.  An additional 10 minutes were spent reviewing her chart (Epic and Care Everywhere) including notes, labs, and imaging studies.    Lequita Asal, MD, PhD    10/18/2020, 9:39 AM  I, Mirian Mo Tufford, am acting as Education administrator for [employer]. Mike Gip, MD, PhD.  I, Shinichi Anguiano C. Mike Gip, MD, have reviewed the above documentation for accuracy and completeness, and I agree with the above.

## 2020-10-18 ENCOUNTER — Inpatient Hospital Stay: Payer: Medicare Other | Attending: Hematology and Oncology

## 2020-10-18 ENCOUNTER — Inpatient Hospital Stay: Payer: Medicare Other

## 2020-10-18 ENCOUNTER — Inpatient Hospital Stay (HOSPITAL_BASED_OUTPATIENT_CLINIC_OR_DEPARTMENT_OTHER): Payer: Medicare Other | Admitting: Hematology and Oncology

## 2020-10-18 ENCOUNTER — Encounter: Payer: Self-pay | Admitting: Hematology and Oncology

## 2020-10-18 ENCOUNTER — Other Ambulatory Visit: Payer: Self-pay

## 2020-10-18 VITALS — BP 123/78 | HR 108 | Temp 97.2°F | Resp 18 | Wt 190.6 lb

## 2020-10-18 DIAGNOSIS — N184 Chronic kidney disease, stage 4 (severe): Secondary | ICD-10-CM | POA: Insufficient documentation

## 2020-10-18 DIAGNOSIS — D631 Anemia in chronic kidney disease: Secondary | ICD-10-CM

## 2020-10-18 DIAGNOSIS — D72821 Monocytosis (symptomatic): Secondary | ICD-10-CM | POA: Diagnosis not present

## 2020-10-18 LAB — CBC WITH DIFFERENTIAL/PLATELET
Abs Immature Granulocytes: 0.46 10*3/uL — ABNORMAL HIGH (ref 0.00–0.07)
Basophils Absolute: 0 10*3/uL (ref 0.0–0.1)
Basophils Relative: 0 %
Eosinophils Absolute: 0 10*3/uL (ref 0.0–0.5)
Eosinophils Relative: 1 %
HCT: 25.3 % — ABNORMAL LOW (ref 36.0–46.0)
Hemoglobin: 7.8 g/dL — ABNORMAL LOW (ref 12.0–15.0)
Immature Granulocytes: 8 %
Lymphocytes Relative: 22 %
Lymphs Abs: 1.4 10*3/uL (ref 0.7–4.0)
MCH: 29.3 pg (ref 26.0–34.0)
MCHC: 30.8 g/dL (ref 30.0–36.0)
MCV: 95.1 fL (ref 80.0–100.0)
Monocytes Absolute: 1.5 10*3/uL — ABNORMAL HIGH (ref 0.1–1.0)
Monocytes Relative: 24 %
Neutro Abs: 2.8 10*3/uL (ref 1.7–7.7)
Neutrophils Relative %: 45 %
Platelets: 122 10*3/uL — ABNORMAL LOW (ref 150–400)
RBC: 2.66 MIL/uL — ABNORMAL LOW (ref 3.87–5.11)
RDW: 16.5 % — ABNORMAL HIGH (ref 11.5–15.5)
Smear Review: NORMAL
WBC: 6.1 10*3/uL (ref 4.0–10.5)
nRBC: 0 % (ref 0.0–0.2)

## 2020-10-18 LAB — IRON AND TIBC
Iron: 32 ug/dL (ref 28–170)
Saturation Ratios: 15 % (ref 10.4–31.8)
TIBC: 207 ug/dL — ABNORMAL LOW (ref 250–450)
UIBC: 175 ug/dL

## 2020-10-18 LAB — SAMPLE TO BLOOD BANK

## 2020-10-18 LAB — BASIC METABOLIC PANEL
Anion gap: 12 (ref 5–15)
BUN: 55 mg/dL — ABNORMAL HIGH (ref 8–23)
CO2: 26 mmol/L (ref 22–32)
Calcium: 8.7 mg/dL — ABNORMAL LOW (ref 8.9–10.3)
Chloride: 100 mmol/L (ref 98–111)
Creatinine, Ser: 2.41 mg/dL — ABNORMAL HIGH (ref 0.44–1.00)
GFR, Estimated: 19 mL/min — ABNORMAL LOW (ref >60)
Glucose, Bld: 86 mg/dL (ref 70–99)
Potassium: 4.4 mmol/L (ref 3.5–5.1)
Sodium: 138 mmol/L (ref 135–145)

## 2020-10-18 LAB — PATHOLOGIST SMEAR REVIEW

## 2020-10-18 LAB — FERRITIN: Ferritin: 538 ng/mL — ABNORMAL HIGH (ref 11–307)

## 2020-10-18 MED ORDER — EPOETIN ALFA-EPBX 10000 UNIT/ML IJ SOLN
20000.0000 [IU] | INTRAMUSCULAR | Status: DC
  Administered 2020-10-18: 20000 [IU] via SUBCUTANEOUS
  Filled 2020-10-18: qty 2

## 2020-10-21 ENCOUNTER — Other Ambulatory Visit: Payer: Self-pay

## 2020-10-21 ENCOUNTER — Emergency Department: Payer: Medicare Other

## 2020-10-21 ENCOUNTER — Emergency Department
Admission: EM | Admit: 2020-10-21 | Discharge: 2020-10-21 | Disposition: A | Discharge status: Home / Self Care | Payer: Medicare Other | Attending: Emergency Medicine | Admitting: Emergency Medicine

## 2020-10-21 DIAGNOSIS — I5033 Acute on chronic diastolic (congestive) heart failure: Secondary | ICD-10-CM | POA: Diagnosis not present

## 2020-10-21 DIAGNOSIS — I2510 Atherosclerotic heart disease of native coronary artery without angina pectoris: Secondary | ICD-10-CM | POA: Diagnosis not present

## 2020-10-21 DIAGNOSIS — Z79899 Other long term (current) drug therapy: Secondary | ICD-10-CM | POA: Insufficient documentation

## 2020-10-21 DIAGNOSIS — D631 Anemia in chronic kidney disease: Secondary | ICD-10-CM | POA: Diagnosis not present

## 2020-10-21 DIAGNOSIS — I509 Heart failure, unspecified: Secondary | ICD-10-CM

## 2020-10-21 DIAGNOSIS — I130 Hypertensive heart and chronic kidney disease with heart failure and stage 1 through stage 4 chronic kidney disease, or unspecified chronic kidney disease: Secondary | ICD-10-CM | POA: Insufficient documentation

## 2020-10-21 DIAGNOSIS — D649 Anemia, unspecified: Secondary | ICD-10-CM | POA: Diagnosis not present

## 2020-10-21 DIAGNOSIS — R0602 Shortness of breath: Secondary | ICD-10-CM | POA: Diagnosis present

## 2020-10-21 DIAGNOSIS — N189 Chronic kidney disease, unspecified: Secondary | ICD-10-CM

## 2020-10-21 DIAGNOSIS — N184 Chronic kidney disease, stage 4 (severe): Secondary | ICD-10-CM | POA: Insufficient documentation

## 2020-10-21 LAB — BASIC METABOLIC PANEL
Anion gap: 10 (ref 5–15)
BUN: 54 mg/dL — ABNORMAL HIGH (ref 8–23)
CO2: 27 mmol/L (ref 22–32)
Calcium: 8.6 mg/dL — ABNORMAL LOW (ref 8.9–10.3)
Chloride: 101 mmol/L (ref 98–111)
Creatinine, Ser: 2.29 mg/dL — ABNORMAL HIGH (ref 0.44–1.00)
GFR, Estimated: 20 mL/min — ABNORMAL LOW (ref >60)
Glucose, Bld: 99 mg/dL (ref 70–99)
Potassium: 4.6 mmol/L (ref 3.5–5.1)
Sodium: 138 mmol/L (ref 135–145)

## 2020-10-21 LAB — CBC WITH DIFFERENTIAL/PLATELET
Abs Immature Granulocytes: 0.38 10*3/uL — ABNORMAL HIGH (ref 0.00–0.07)
Basophils Absolute: 0 10*3/uL (ref 0.0–0.1)
Basophils Relative: 0 %
Eosinophils Absolute: 0 10*3/uL (ref 0.0–0.5)
Eosinophils Relative: 0 %
HCT: 26.3 % — ABNORMAL LOW (ref 36.0–46.0)
Hemoglobin: 7.8 g/dL — ABNORMAL LOW (ref 12.0–15.0)
Immature Granulocytes: 5 %
Lymphocytes Relative: 29 %
Lymphs Abs: 2.1 10*3/uL (ref 0.7–4.0)
MCH: 28.9 pg (ref 26.0–34.0)
MCHC: 29.7 g/dL — ABNORMAL LOW (ref 30.0–36.0)
MCV: 97.4 fL (ref 80.0–100.0)
Monocytes Absolute: 1.5 10*3/uL — ABNORMAL HIGH (ref 0.1–1.0)
Monocytes Relative: 20 %
Neutro Abs: 3.3 10*3/uL (ref 1.7–7.7)
Neutrophils Relative %: 46 %
Platelets: 149 10*3/uL — ABNORMAL LOW (ref 150–400)
RBC: 2.7 MIL/uL — ABNORMAL LOW (ref 3.87–5.11)
RDW: 16.6 % — ABNORMAL HIGH (ref 11.5–15.5)
Smear Review: NORMAL
WBC: 7.6 10*3/uL (ref 4.0–10.5)
nRBC: 0.3 % — ABNORMAL HIGH (ref 0.0–0.2)

## 2020-10-21 LAB — BRAIN NATRIURETIC PEPTIDE: B Natriuretic Peptide: 1370.3 pg/mL — ABNORMAL HIGH (ref 0.0–100.0)

## 2020-10-21 LAB — TROPONIN I (HIGH SENSITIVITY)
Troponin I (High Sensitivity): 33 ng/L — ABNORMAL HIGH (ref <18)
Troponin I (High Sensitivity): 32 ng/L — ABNORMAL HIGH (ref <18)

## 2020-10-21 MED ORDER — FUROSEMIDE 10 MG/ML IJ SOLN
40.0000 mg | Freq: Once | INTRAMUSCULAR | Status: AC
  Administered 2020-10-21: 40 mg via INTRAVENOUS
  Filled 2020-10-21: qty 4

## 2020-10-21 MED ORDER — FUROSEMIDE 20 MG PO TABS: 20.0000 mg | ORAL_TABLET | Freq: Two times a day (BID) | ORAL | 0 refills | Status: AC

## 2020-10-21 NOTE — ED Notes (Signed)
[facility] to transport back to [facility].

## 2020-10-21 NOTE — ED Provider Notes (Signed)
[facility] Emergency Department Provider Note  ____________________________________________  Time seen: Approximately 6:01 PM  I have reviewed the triage vital signs and the nursing notes.   HISTORY  Chief Complaint Shortness of Breath    HPI Kia Carandang is a 87 y.o. female with a history of atrial fibrillation CKD CHF hypertension who comes ED complaining of shortness of breath for the past 3 days, waxing waning, no aggravating or alleviating factors.  She is on 2 L nasal cannula chronically.  Denies chest pain.  EMS report oxygen saturation of 100% on 2 L nasal cannula.  On arrival to the ED, patient denies any current chest pain, denies shortness of breath, states that she feels fine and she is ready to go home.  States that earlier today she was talking with a visitor, and she got worked up and worry about her shortness of breath and felt like she needed to be evaluated today.      Past Medical History:  Diagnosis Date  . Anemia   . Atrial fibrillation ([place])   . CHF (congestive heart failure) ([place])   . CKD (chronic kidney disease), stage IV ([place])   . Coronary artery disease   . GERD (gastroesophageal reflux disease)   . HOH (hard of hearing)   . Hyperlipidemia   . Hypertension   . LVH (left ventricular hypertrophy) due to hypertensive disease   . Pneumonia   . Pulmonary hypertension ([place])   . PVD (peripheral vascular disease) ([place])   . Renal disorder   . Vertigo      Patient Active Problem List   Diagnosis Date Noted  . Permanent atrial fibrillation with rapid ventricular response ([place]) 09/25/2020  . Acute kidney injury superimposed on CKD ([place]) 09/25/2020  . Dyspnea 08/06/2020  . Atrial fibrillation, chronic ([place])   . Acute on chronic diastolic CHF (congestive heart failure) ([place])   . Permanent atrial fibrillation ([place])   . Acquired thrombophilia ([place])   . Weakness   . Chronic respiratory failure with hypoxia ([place])  07/18/2020  . Atrial fibrillation with RVR ([place]) 07/18/2020  . Monocytosis 10/03/2019  . Normocytic anemia 09/17/2019  . Hyperlipidemia, mixed 09/17/2019  . Peripheral vascular disease ([place]) 09/17/2019  . Anemia in stage 4 chronic kidney disease ([place]) 09/17/2019  . ANA positive 09/01/2019  . Flat feet, bilateral 09/01/2019  . CKD (chronic kidney disease), stage IV ([place]) 09/01/2019  . Primary osteoarthritis of hand 09/01/2019  . Dizziness 02/24/2019  . LVH (left ventricular hypertrophy) due to hypertensive disease, without heart failure 01/28/2018  . HCAP (healthcare-associated pneumonia) 09/24/2016  . Sepsis ([place]) 09/12/2016  . Community acquired pneumonia 09/12/2016  . Hypoxia 09/12/2016  . Pleuritic chest pain 09/12/2016  . Pneumonia 09/12/2016  . Essential hypertension 01/05/2015  . Mild aortic valve stenosis 10/20/2014  . Moderate mitral insufficiency 10/20/2014  . Moderate tricuspid insufficiency 10/20/2014  . Chronic pulmonary hypertension ([place]) 09/22/2014  . Venous insufficiency of both lower extremities 09/22/2014  . CTS (carpal tunnel syndrome) 03/21/2014     Past Surgical History:  Procedure Laterality Date  . APPENDECTOMY  1939  . TONSILLECTOMY  1939     Prior to Admission medications   Medication Sig Start Date End Date Taking? Authorizing Provider  albuterol (VENTOLIN HFA) 108 (90 Base) MCG/ACT inhaler Inhale 2 puffs into the lungs every 6 (six) hours as needed for wheezing or shortness of breath. 07/24/20   Loletha Grayer, MD  cyanocobalamin 1000 MCG tablet Take 1,000 mcg by mouth daily.  Patient not taking: Reported on 10/18/2020    [provider]  diltiazem (TIAZAC) 180 MG 24 hr capsule Take 180 mg by mouth daily. Patient not taking: Reported on 10/18/2020    [provider]  furosemide (LASIX) 20 MG tablet Take 1 tablet (20 mg total) by mouth 2 (two) times daily for 3 days. After the next three days, resume taking once a day. 10/21/20  10/24/20  Carrie Mew, MD  metoprolol tartrate (LOPRESSOR) 25 MG tablet Take 25 mg by mouth 2 (two) times daily. 08/02/20   [provider]  Skin Protectants, Misc. (EUCERIN) cream Apply topically as needed for dry skin.    [provider]     Allergies Sulfa antibiotics   Family History  Problem Relation Age of Onset  . Heart disease Mother   . Goiter Mother   . Bladder Cancer Father 52    Social History Social History   Tobacco Use  . Smoking status: Never Smoker  . Smokeless tobacco: Never Used  Vaping Use  . Vaping Use: Never used  Substance Use Topics  . Alcohol use: No  . Drug use: No    Review of Systems  Constitutional:   No fever or chills.  ENT:   No sore throat. No rhinorrhea. Cardiovascular:   No chest pain or syncope. Respiratory: Positive shortness of breath without cough. Gastrointestinal:   Negative for abdominal pain, vomiting and diarrhea.  Musculoskeletal: Positive chronic edema bilateral lower extremities All other systems reviewed and are negative except as documented above in ROS and HPI.  ____________________________________________   PHYSICAL EXAM:  VITAL SIGNS: ED Triage Vitals [10/21/20 1757]  Enc Vitals Group     BP (!) 116/91     Pulse Rate 71     Resp 20     Temp (!) 97.1 F (36.2 C)     Temp Source Axillary     SpO2 100 %     Weight      Height      Head Circumference      Peak Flow      Pain Score      Pain Loc      Pain Edu?      Excl. in [place]?     Vital signs reviewed, nursing assessments reviewed.   Constitutional:   Alert and oriented. Non-toxic appearance. Eyes:   Conjunctivae are normal. EOMI. PERRL. ENT      Head:   Normocephalic and atraumatic.      Nose:   Wearing a mask.      Mouth/Throat:   Wearing a mask.      Neck:   No meningismus. Full ROM. Hematological/Lymphatic/Immunilogical:   No cervical lymphadenopathy. Cardiovascular:   RRR. Symmetric bilateral radial and DP pulses.  No  murmurs. Cap refill less than 2 seconds. Respiratory:   Normal respiratory effort without tachypnea/retractions. Breath sounds are clear and equal bilaterally. No wheezes/rales/rhonchi. Gastrointestinal:   Soft and nontender. Non distended. There is no CVA tenderness.  No rebound, rigidity, or guarding.  Musculoskeletal:   Normal range of motion in all extremities. No joint effusions.  No lower extremity tenderness.  2+ pitting edema bilateral lower extremities, symmetric.Marland Kitchen  No inflammatory changes Neurologic:   Normal speech and language.  Motor grossly intact. No acute focal neurologic deficits are appreciated.  Skin:    Skin is warm, dry with a small noninfected ulcer on the right lateral lower leg.  Chronic venous stasis skin changes.    ____________________________________________  LABS (pertinent positives/negatives) (all labs ordered are listed, but only abnormal results are displayed) Labs Reviewed  BASIC METABOLIC PANEL - Abnormal; Notable for the following components:      Result Value   BUN 54 (*)    Creatinine, Ser 2.29 (*)    Calcium 8.6 (*)    GFR, Estimated 20 (*)    All other components within normal limits  CBC WITH DIFFERENTIAL/PLATELET - Abnormal; Notable for the following components:   RBC 2.70 (*)    Hemoglobin 7.8 (*)    HCT 26.3 (*)    MCHC 29.7 (*)    RDW 16.6 (*)    Platelets 149 (*)    nRBC 0.3 (*)    Monocytes Absolute 1.5 (*)    Abs Immature Granulocytes 0.38 (*)    All other components within normal limits  BRAIN NATRIURETIC PEPTIDE - Abnormal; Notable for the following components:   B Natriuretic Peptide 1,370.3 (*)    All other components within normal limits  TROPONIN I (HIGH SENSITIVITY) - Abnormal; Notable for the following components:   Troponin I (High Sensitivity) 33 (*)    All other components within normal limits  TROPONIN I (HIGH SENSITIVITY) - Abnormal; Notable for the following components:   Troponin I (High Sensitivity) 32 (*)     All other components within normal limits   ____________________________________________   EKG  Interpreted by me Atrial fibrillation, rate of 90.  Normal axis and intervals.  Poor R wave progression.  Normal ST segments and T waves.  ____________________________________________    [id number]  DG Chest Portable 1 View  Result Date: 10/21/2020 CLINICAL DATA:  Shortness of breath. EXAM: PORTABLE CHEST 1 VIEW COMPARISON:  September 30, 2020 FINDINGS: The heart size remains slightly enlarged. Aortic calcifications are noted. There are growing bilateral pleural effusions with adjacent airspace disease favored to represent atelectasis. There is a growing opacity in the right mid lung zone favored to represent increasing fluid within 1 of the right lung fissures. There is no pneumothorax. There is vascular congestion. IMPRESSION: Findings suggestive of congestive heart failure with growing bilateral pleural effusions. Electronically Signed   By: Constance Holster M.D.   On: 10/21/2020 18:40    ____________________________________________   PROCEDURES Procedures  ____________________________________________  DIFFERENTIAL DIAGNOSIS   Pneumonia, pleural effusion, pulmonary edema, anxiety, non-STEMI  CLINICAL IMPRESSION / ASSESSMENT AND PLAN / ED COURSE  Medications ordered in the ED: Medications  furosemide (LASIX) injection 40 mg (40 mg Intravenous Given 10/21/20 2014)    Pertinent labs & imaging results that were available during my care of the patient were reviewed by me and considered in my medical decision making (see chart for details).  Loralyn Freshwater Kerwin was evaluated in Emergency Department on 10/21/2020 for the symptoms described in the history of present illness. She was evaluated in the context of the global COVID-19 pandemic, which necessitated consideration that the patient might be at risk for infection with the SARS-CoV-2 virus that causes COVID-19. Institutional  protocols and algorithms that pertain to the evaluation of patients at risk for COVID-19 are in a state of rapid change based on information released by regulatory bodies including the CDC and federal and state organizations. These policies and algorithms were followed during the patient's care in the ED.   Patient presents with vague shortness of breath for the past 3 days.  Now symptoms have resolved.  Vital signs are unremarkable, she is 100% on her usual nasal cannula oxygen.  Exam is reassuring.  She  does have chronic peripheral edema for which she takes furosemide.  Her atrial fibrillation is rate controlled on her diltiazem and metoprolol.  Doubt cellulitis.  Will check labs and a chest x-ray.  Given 3 days of symptoms which are now resolved, single high-sensitivity troponin is sufficient.   ----------------------------------------- 9:44 PM on 10/21/2020 -----------------------------------------  Vitals remain stable.  Chest x-ray shows some vascular congestion, BNP is elevated compared to chronic baseline.  Troponin flat and decreased from chronic baseline.  Given her symptoms and exam, this is consistent with some mild decompensation of chronic heart failure.  She is on daily Lasix.  She was given a dose of IV Lasix here.  Will increase to twice daily Lasix for the next 3 days, then resume daily, follow-up with heart failure clinic.  Clinical Course as of 10/21/20 2145  Sat Oct 21, 2020  2145 Findings and plan of care discussed with patient son at bedside as well.  Patient and son are agreeable to plan. [PS]    Clinical Course User Index [PS] Carrie Mew, MD     ____________________________________________   FINAL CLINICAL IMPRESSION(S) / ED DIAGNOSES    Final diagnoses:  Acute on chronic congestive heart failure, unspecified heart failure type ([place])  Chronic kidney disease, unspecified CKD stage  Chronic anemia     ED Discharge Orders         Ordered    furosemide  (LASIX) 20 MG tablet  2 times daily        10/21/20 2144          Portions of this note were generated with dragon dictation software. Dictation errors may occur despite best attempts at proofreading.   Carrie Mew, MD 10/21/20 2145

## 2020-10-21 NOTE — ED Notes (Signed)
Patient in stretcher, A&Ox4. Patient's son bedside.  Patient educated to allow this RN to place external catheter due to administration of lasix.

## 2020-10-21 NOTE — ED Notes (Signed)
Pt presents to ED from [facility] with c/o of SOB with a HX of CHF. Pt chronically wears 2L/min of O2. Per EMS pt has "anxiety over breathing". PT has NAD noted. Pt states BLE is more increased than normal, pt does have 2+ edema to BLE Pt has bruise to R eye from a fall a few days ago out of bed. Pt denies chest pain to this RN. Pt 100% on RA.

## 2020-10-25 ENCOUNTER — Other Ambulatory Visit: Payer: Self-pay

## 2020-10-25 ENCOUNTER — Encounter: Payer: Self-pay | Admitting: Family

## 2020-10-25 ENCOUNTER — Ambulatory Visit: Payer: Medicare Other | Attending: Family | Admitting: Family

## 2020-10-25 VITALS — BP 124/73 | HR 92 | Resp 16 | Ht 69.0 in | Wt 186.0 lb

## 2020-10-25 DIAGNOSIS — Y939 Activity, unspecified: Secondary | ICD-10-CM | POA: Diagnosis not present

## 2020-10-25 DIAGNOSIS — N184 Chronic kidney disease, stage 4 (severe): Secondary | ICD-10-CM | POA: Insufficient documentation

## 2020-10-25 DIAGNOSIS — Z7901 Long term (current) use of anticoagulants: Secondary | ICD-10-CM | POA: Insufficient documentation

## 2020-10-25 DIAGNOSIS — R42 Dizziness and giddiness: Secondary | ICD-10-CM | POA: Insufficient documentation

## 2020-10-25 DIAGNOSIS — I739 Peripheral vascular disease, unspecified: Secondary | ICD-10-CM | POA: Insufficient documentation

## 2020-10-25 DIAGNOSIS — Z79899 Other long term (current) drug therapy: Secondary | ICD-10-CM | POA: Diagnosis not present

## 2020-10-25 DIAGNOSIS — I272 Pulmonary hypertension, unspecified: Secondary | ICD-10-CM | POA: Diagnosis not present

## 2020-10-25 DIAGNOSIS — I4821 Permanent atrial fibrillation: Secondary | ICD-10-CM | POA: Diagnosis not present

## 2020-10-25 DIAGNOSIS — D649 Anemia, unspecified: Secondary | ICD-10-CM | POA: Insufficient documentation

## 2020-10-25 DIAGNOSIS — S81801A Unspecified open wound, right lower leg, initial encounter: Secondary | ICD-10-CM | POA: Diagnosis not present

## 2020-10-25 DIAGNOSIS — I10 Essential (primary) hypertension: Secondary | ICD-10-CM

## 2020-10-25 DIAGNOSIS — Y929 Unspecified place or not applicable: Secondary | ICD-10-CM | POA: Insufficient documentation

## 2020-10-25 DIAGNOSIS — I2510 Atherosclerotic heart disease of native coronary artery without angina pectoris: Secondary | ICD-10-CM | POA: Diagnosis not present

## 2020-10-25 DIAGNOSIS — I130 Hypertensive heart and chronic kidney disease with heart failure and stage 1 through stage 4 chronic kidney disease, or unspecified chronic kidney disease: Secondary | ICD-10-CM | POA: Diagnosis not present

## 2020-10-25 DIAGNOSIS — M7989 Other specified soft tissue disorders: Secondary | ICD-10-CM | POA: Diagnosis not present

## 2020-10-25 DIAGNOSIS — I5032 Chronic diastolic (congestive) heart failure: Secondary | ICD-10-CM | POA: Insufficient documentation

## 2020-10-25 DIAGNOSIS — R0602 Shortness of breath: Secondary | ICD-10-CM | POA: Insufficient documentation

## 2020-10-25 DIAGNOSIS — Z8249 Family history of ischemic heart disease and other diseases of the circulatory system: Secondary | ICD-10-CM | POA: Insufficient documentation

## 2020-10-25 DIAGNOSIS — I890 Lymphedema, not elsewhere classified: Secondary | ICD-10-CM | POA: Diagnosis not present

## 2020-10-25 DIAGNOSIS — E785 Hyperlipidemia, unspecified: Secondary | ICD-10-CM | POA: Insufficient documentation

## 2020-10-25 DIAGNOSIS — R609 Edema, unspecified: Secondary | ICD-10-CM | POA: Diagnosis not present

## 2020-10-25 DIAGNOSIS — D638 Anemia in other chronic diseases classified elsewhere: Secondary | ICD-10-CM

## 2020-10-25 DIAGNOSIS — K219 Gastro-esophageal reflux disease without esophagitis: Secondary | ICD-10-CM | POA: Diagnosis not present

## 2020-10-25 DIAGNOSIS — T1490XA Injury, unspecified, initial encounter: Secondary | ICD-10-CM | POA: Diagnosis not present

## 2020-10-25 NOTE — Patient Instructions (Addendum)
Continue weighing daily and call for an overnight weight gain of > 2 pounds or a weekly weight gain of >5 pounds.  [facility]  Dr. Terri Skains  [address] [place], [place] [address] [phone] March 17th at [place]  [address], [place], [place] [address] [phone] 10/30/20 at 815am

## 2020-10-25 NOTE — Progress Notes (Signed)
Patient ID: Felicia [name], female    DOB: May 11, 1929, 87 y.o.   MRN: [id number]  HPI  Felicia [name] is a 87 y/o female with a history of CAD, hyperlipidemia, HTN, CKD, vertigo, GERD, HOH, atrial fibrillation, pulmonary HTN, anemia, PVD and chronic heart failure.   Echo report from 07/18/20 reviewed and showed an EF of 55-60% along with mild LVH, moderate LAE, moderate TR, moderate AS and mild/moderate MR.  Was in the ED 10/21/20 due to acute on chronic HF. CXR shows some vascular congestion. Given IV lasix and short duration if increased oral furosemide and she was released.  Was in the ED 10/15/20 due to a fall after falling out of bed. Small laceration noted over her right forehead which was treated. Head CT was negative and she was released.  Admitted 09/24/20 due to shortness of breath and AF. Initially given IV lasix with transition to oral diuretics. Palliative care consult done. Discharged after 4 days. Was in the ED 08/01/20 due to shortness of breath and leg swelling. Initially given IV diltiazem due to AF with RVR and then transitioned to oral medications. Symptoms and HR improved and she was released. Admitted 07/18/20 due to acute on chronic HF. Cardiology consult obtained. Needed oxygen due to sat of 87%. Initially given IV lasix with transition to oral diuretics. ReDS vest reading was 29%. Not anticoagulated due to anemia. PT consult done. Discharged after 6 days.   She presents today for a follow-up visit with a chief complaint of moderate fatigue upon minimal exertion. She describes this as chronic in nature having been present for several years. She has associated shortness of breath, light-headedness, easy bruising, depression, pedal edema and wound on her right lower leg along with this. She denies any difficulty sleeping, abdominal distention, palpitations, chest pain, cough or weight gain.   Past Medical History:  Diagnosis Date  . Anemia   . Atrial fibrillation ([place])   .  CHF (congestive heart failure) ([place])   . CKD (chronic kidney disease), stage IV ([place])   . Coronary artery disease   . GERD (gastroesophageal reflux disease)   . HOH (hard of hearing)   . Hyperlipidemia   . Hypertension   . LVH (left ventricular hypertrophy) due to hypertensive disease   . Pneumonia   . Pulmonary hypertension ([place])   . PVD (peripheral vascular disease) ([place])   . Renal disorder   . Vertigo    Past Surgical History:  Procedure Laterality Date  . APPENDECTOMY  1939  . TONSILLECTOMY  1939   Family History  Problem Relation Age of Onset  . Heart disease Mother   . Goiter Mother   . Bladder Cancer Father 41   Social History   Tobacco Use  . Smoking status: Never Smoker  . Smokeless tobacco: Never Used  Substance Use Topics  . Alcohol use: No   Allergies  Allergen Reactions  . Sulfa Antibiotics     Information received from Felicia [name] pt profile.   Prior to Admission medications   Medication Sig Start Date End Date Taking? Authorizing Provider  albuterol (VENTOLIN HFA) 108 (90 Base) MCG/ACT inhaler Inhale 2 puffs into the lungs every 6 (six) hours as needed for wheezing or shortness of breath. 07/24/20  Yes Wieting, Richard, MD  cyanocobalamin 1000 MCG tablet Take 1,000 mcg by mouth daily.   Yes [provider]  diltiazem (TIAZAC) 180 MG 24 hr capsule Take 180 mg by mouth daily.   Yes [provider]  furosemide (LASIX) 20 MG tablet Take 1 tablet (20 mg total) by mouth 2 (two) times daily for 3 days. After the next three days, resume taking once a day. Patient taking differently: Take 20 mg by mouth daily. 10/21/20 10/24/20 Yes Carrie Mew, MD  metoprolol tartrate (LOPRESSOR) 50 MG tablet Take 50 mg by mouth 2 (two) times daily. 08/02/20  Yes [provider]  Skin Protectants, Misc. (EUCERIN) cream Apply topically as needed for dry skin.   Yes [provider]    Review of Systems  Constitutional: Positive for  fatigue (tire easily). Negative for appetite change.  HENT: Positive for hearing loss and rhinorrhea. Negative for congestion and sore throat.   Eyes: Negative.   Respiratory: Positive for shortness of breath. Negative for cough.   Cardiovascular: Positive for leg swelling. Negative for chest pain and palpitations.  Gastrointestinal: Negative for abdominal distention and abdominal pain.  Endocrine: Negative.   Genitourinary: Negative.   Musculoskeletal: Negative for back pain and neck pain.  Skin: Positive for wound (right lower leg).  Allergic/Immunologic: Negative.   Neurological: Positive for light-headedness. Negative for dizziness.  Hematological: Negative for adenopathy. Bruises/bleeds easily (side of right temple).  Psychiatric/Behavioral: Positive for dysphoric mood. Negative for sleep disturbance (sleeping on 2 pillows; wearing oxygen at 2L around the clock). The patient is not nervous/anxious.    Vitals:   10/25/20 1118  BP: 124/73  Pulse: 92  Resp: 16  SpO2: 96%  Weight: 186 lb (84.4 kg)  Height: '5\' 9"'$  (1.753 m)   Wt Readings from Last 3 Encounters:  10/25/20 186 lb (84.4 kg)  10/21/20 190 lb 9.4 oz (86.4 kg)  10/18/20 190 lb 9.1 oz (86.4 kg)   Lab Results  Component Value Date   CREATININE 2.29 (H) 10/21/2020   CREATININE 2.41 (H) 10/18/2020   CREATININE 2.61 (H) 09/28/2020     Physical Exam Vitals and nursing note reviewed. Exam conducted with a chaperone present (son).  Constitutional:      Appearance: Normal appearance.  HENT:     Head: Normocephalic and atraumatic.     Right Ear: Decreased hearing noted.     Left Ear: Decreased hearing noted.  Cardiovascular:     Rate and Rhythm: Normal rate. Rhythm irregular.  Pulmonary:     Effort: Pulmonary effort is normal. No respiratory distress.     Breath sounds: No wheezing or rales.  Abdominal:     General: There is no distension.     Palpations: Abdomen is soft.     Tenderness: There is no abdominal  tenderness.  Musculoskeletal:        General: No tenderness.     Cervical back: Normal range of motion and neck supple.     Right lower leg: Edema (2+ pitting) present.     Left lower leg: No tenderness. Edema (2+ pitting) present.  Skin:    General: Skin is warm and dry.     Comments: Bandage on right lower leg  Neurological:     General: No focal deficit present.     Mental Status: She is alert and oriented to person, place, and time.  Psychiatric:        Mood and Affect: Mood normal.        Behavior: Behavior normal.    Assessment & Plan:   1: Chronic heart failure with preserved ejection fraction along with structural changes (LVH)- - NYHA class III - euvolemic today - not being weighed daily; order written again to weigh daily  and to call for an overnight weight gain of >2 pounds or a weekly weight gain of >5 pounds - weight stable from last visit here 10 weeks ago - not adding salt and tries to follow a low sodium diet - BNP 10/21/20 was 1370.3 - does not get the flu vaccine  2: HTN- - BP looks good today  - saw PCP (Feldpausch) 08/16/20 - BMP 10/21/20 reviewed and showed sodium 138, potassium 4.6, creatinine 2.29 and GFR 20  3: Permanent atrial fibrillation- - saw cardiology Nehemiah Massed) 08/16/20 - currently rate controlled  4: Anemia- - saw GI (Vanga) 08/10/20 - saw hematology Mike Gip) 10/18/20 - iron infusion done 10/18/20 - Hg on 10/21/20 was 7.8  5: Lymphedema- - does elevate her legs when sitting for long periods of time - referral made to wound center for assistance with open wound and lymphedema; appt scheduled for 10/30/20 - appt also scheduled with nephrology Holley Raring) on 11/16/20 to assist with diuretic management with her worsening renal function   Facility medication list reviewed.   Return in 6 weeks or sooner for any questions/problems before then.

## 2020-10-26 ENCOUNTER — Encounter: Payer: Self-pay | Admitting: Family

## 2020-10-30 ENCOUNTER — Ambulatory Visit: Payer: Medicare Other | Admitting: Physician Assistant

## 2020-11-01 ENCOUNTER — Inpatient Hospital Stay: Payer: No Typology Code available for payment source

## 2020-11-01 ENCOUNTER — Other Ambulatory Visit: Payer: Self-pay

## 2020-11-01 ENCOUNTER — Inpatient Hospital Stay: Payer: No Typology Code available for payment source | Attending: Hematology and Oncology

## 2020-11-01 ENCOUNTER — Ambulatory Visit (INDEPENDENT_AMBULATORY_CARE_PROVIDER_SITE_OTHER)
Admit: 2020-11-01 | Discharge: 2020-11-01 | Disposition: A | Discharge status: Home / Self Care | Payer: Medicare Other | Attending: Sports Medicine | Admitting: Sports Medicine

## 2020-11-01 ENCOUNTER — Ambulatory Visit
Admission: EM | Admit: 2020-11-01 | Discharge: 2020-11-01 | Disposition: A | Discharge status: Home / Self Care | Payer: Medicare Other | Attending: Sports Medicine | Admitting: Sports Medicine

## 2020-11-01 VITALS — BP 157/78 | HR 78 | Resp 18

## 2020-11-01 DIAGNOSIS — M7989 Other specified soft tissue disorders: Secondary | ICD-10-CM | POA: Diagnosis not present

## 2020-11-01 DIAGNOSIS — T148XXA Other injury of unspecified body region, initial encounter: Secondary | ICD-10-CM | POA: Diagnosis not present

## 2020-11-01 DIAGNOSIS — D631 Anemia in chronic kidney disease: Secondary | ICD-10-CM | POA: Insufficient documentation

## 2020-11-01 DIAGNOSIS — N184 Chronic kidney disease, stage 4 (severe): Secondary | ICD-10-CM | POA: Diagnosis present

## 2020-11-01 DIAGNOSIS — R600 Localized edema: Secondary | ICD-10-CM | POA: Diagnosis not present

## 2020-11-01 DIAGNOSIS — L89892 Pressure ulcer of other site, stage 2: Secondary | ICD-10-CM | POA: Diagnosis not present

## 2020-11-01 DIAGNOSIS — L24A9 Irritant contact dermatitis due friction or contact with other specified body fluids: Secondary | ICD-10-CM | POA: Diagnosis not present

## 2020-11-01 DIAGNOSIS — L089 Local infection of the skin and subcutaneous tissue, unspecified: Secondary | ICD-10-CM

## 2020-11-01 DIAGNOSIS — D72821 Monocytosis (symptomatic): Secondary | ICD-10-CM

## 2020-11-01 LAB — HEMOGLOBIN AND HEMATOCRIT, BLOOD
HCT: 26.3 % — ABNORMAL LOW (ref 36.0–46.0)
Hemoglobin: 7.8 g/dL — ABNORMAL LOW (ref 12.0–15.0)

## 2020-11-01 LAB — IRON AND TIBC
Iron: 39 ug/dL (ref 28–170)
Saturation Ratios: 17 % (ref 10.4–31.8)
TIBC: 228 ug/dL — ABNORMAL LOW (ref 250–450)
UIBC: 189 ug/dL

## 2020-11-01 LAB — FERRITIN: Ferritin: 330 ng/mL — ABNORMAL HIGH (ref 11–307)

## 2020-11-01 LAB — SAMPLE TO BLOOD BANK

## 2020-11-01 MED ORDER — CEPHALEXIN 500 MG PO CAPS: 500.0000 mg | ORAL_CAPSULE | Freq: Two times a day (BID) | ORAL | 0 refills | Status: AC

## 2020-11-01 MED ORDER — EPOETIN ALFA-EPBX 10000 UNIT/ML IJ SOLN
20000.0000 [IU] | INTRAMUSCULAR | Status: DC
  Administered 2020-11-01: 20000 [IU] via SUBCUTANEOUS

## 2020-11-01 NOTE — ED Triage Notes (Addendum)
Patient states that she has been having a wound to the back of her right lower leg that started around 1 month ago. States that she is unable to see the wound clinic until next week. Patient states that wound has been constantly draining. She has soaked through her pants that she has on today. Patient was seen at Cancer center today and advised to come here to assess wound. Patient is a current resident at [facility].

## 2020-11-01 NOTE — ED Provider Notes (Signed)
[facility]    CSN: [id number] Arrival date & time: 11/01/20  1201      History   Chief Complaint Chief Complaint  Patient presents with  . Wound Infection    HPI Felicia [name] is a 87 y.o. female.   Pleasant 87 year old female who presents with her son for evaluation of some wounds on her right lower extremity.  She is being followed by the cancer center and gets infusions every 2 weeks for what sounds like anemia.  Patient is a poor historian.  Her son is not able to provide an adequate history either.  It appears as though [facility] secondary to a hospital admission for CHF.  She is a fall risk and per her son she fell a few weeks ago.  There is a lesion on the posterior aspect of her calf that they feel was secondary to that fall.  There is another one on the anterior aspect of her lower extremity over the tibialis anterior proximally.  It is felt that this has been present for quite a while.  There is also another one over the distal medial aspect of the upper leg just above the knee.  There are weeping and draining.  Appears as though the bandages that she has on are not adequate.  There was concern that she has an infection.  She is scheduled to see wound care but not until next week.  Her son says that she is been trying to get out of the skilled nursing facility and go to a nursing home but she has had several medical issues that have come up and have delayed her discharge.  She denies any chest pain or shortness of breath.     Past Medical History:  Diagnosis Date  . Anemia   . Atrial fibrillation ([place])   . CHF (congestive heart failure) ([place])   . CKD (chronic kidney disease), stage IV ([place])   . Coronary artery disease   . GERD (gastroesophageal reflux disease)   . HOH (hard of hearing)   . Hyperlipidemia   . Hypertension   . LVH (left ventricular hypertrophy) due to hypertensive disease   . Pneumonia   .  Pulmonary hypertension ([place])   . PVD (peripheral vascular disease) ([place])   . Renal disorder   . Vertigo     Patient Active Problem List   Diagnosis Date Noted  . Permanent atrial fibrillation with rapid ventricular response ([place]) 09/25/2020  . Acute kidney injury superimposed on CKD ([place]) 09/25/2020  . Dyspnea 08/06/2020  . Atrial fibrillation, chronic ([place])   . Acute on chronic diastolic CHF (congestive heart failure) ([place])   . Permanent atrial fibrillation ([place])   . Acquired thrombophilia ([place])   . Weakness   . Chronic respiratory failure with hypoxia ([place]) 07/18/2020  . Atrial fibrillation with RVR ([place]) 07/18/2020  . Monocytosis 10/03/2019  . Normocytic anemia 09/17/2019  . Hyperlipidemia, mixed 09/17/2019  . Peripheral vascular disease ([place]) 09/17/2019  . Anemia in stage 4 chronic kidney disease ([place]) 09/17/2019  . ANA positive 09/01/2019  . Flat feet, bilateral 09/01/2019  . CKD (chronic kidney disease), stage IV ([place]) 09/01/2019  . Primary osteoarthritis of hand 09/01/2019  . Dizziness 02/24/2019  . LVH (left ventricular hypertrophy) due to hypertensive disease, without heart failure 01/28/2018  . HCAP (healthcare-associated pneumonia) 09/24/2016  . Sepsis ([place]) 09/12/2016  . Community acquired pneumonia 09/12/2016  . Hypoxia 09/12/2016  . Pleuritic  chest pain 09/12/2016  . Pneumonia 09/12/2016  . Essential hypertension 01/05/2015  . Mild aortic valve stenosis 10/20/2014  . Moderate mitral insufficiency 10/20/2014  . Moderate tricuspid insufficiency 10/20/2014  . Chronic pulmonary hypertension ([place]) 09/22/2014  . Venous insufficiency of both lower extremities 09/22/2014  . CTS (carpal tunnel syndrome) 03/21/2014    Past Surgical History:  Procedure Laterality Date  . APPENDECTOMY  1939  . TONSILLECTOMY  1939    OB History   No obstetric history on file.      Home Medications    Prior to Admission medications   Medication Sig Start Date End Date  Taking? Authorizing Provider  albuterol (VENTOLIN HFA) 108 (90 Base) MCG/ACT inhaler Inhale 2 puffs into the lungs every 6 (six) hours as needed for wheezing or shortness of breath. 07/24/20  Yes Wieting, Richard, MD  cephALEXin (KEFLEX) 500 MG capsule Take 1 capsule (500 mg total) by mouth 2 (two) times daily. 11/01/20  Yes Verda Cumins, MD  cyanocobalamin 1000 MCG tablet Take 1,000 mcg by mouth daily.   Yes [provider]  diltiazem (TIAZAC) 180 MG 24 hr capsule Take 180 mg by mouth daily.   Yes [provider]  furosemide (LASIX) 20 MG tablet Take 1 tablet (20 mg total) by mouth 2 (two) times daily for 3 days. After the next three days, resume taking once a day. Patient taking differently: Take 20 mg by mouth daily. 10/21/20 10/24/20 Yes Carrie Mew, MD  metoprolol tartrate (LOPRESSOR) 50 MG tablet Take 50 mg by mouth 2 (two) times daily. 08/02/20  Yes [provider]  Skin Protectants, Misc. (EUCERIN) cream Apply topically as needed for dry skin.   Yes [provider]    Family History Family History  Problem Relation Age of Onset  . Heart disease Mother   . Goiter Mother   . Bladder Cancer Father 61    Social History Social History   Tobacco Use  . Smoking status: Never Smoker  . Smokeless tobacco: Never Used  Vaping Use  . Vaping Use: Never used  Substance Use Topics  . Alcohol use: No  . Drug use: No     Allergies   Sulfa antibiotics   Review of Systems Review of Systems  Constitutional: Positive for activity change. Negative for appetite change, chills, diaphoresis, fatigue and fever.  Respiratory: Negative for cough, chest tightness, shortness of breath, wheezing and stridor.   Cardiovascular: Negative for chest pain and palpitations.  Genitourinary: Negative.   Musculoskeletal: Negative for back pain, neck pain and neck stiffness.  Skin: Positive for color change and wound. Negative for pallor and rash.  Neurological:  Negative for dizziness, light-headedness and headaches.  All other systems reviewed and are negative.    Physical Exam Triage Vital Signs ED Triage Vitals  Enc Vitals Group     BP 11/01/20 1230 (!) 140/100     Pulse Rate 11/01/20 1230 90     Resp 11/01/20 1230 18     Temp 11/01/20 1230 98.5 F (36.9 C)     Temp Source 11/01/20 1230 Oral     SpO2 11/01/20 1230 96 %     Weight 11/01/20 1226 185 lb 3 oz (84 kg)     Height 11/01/20 1226 '5\' 9"'$  (1.753 m)     Head Circumference --      [facility] Flow --      Pain Score 11/01/20 1225 2     Pain Loc --      Pain  Edu? --      Excl. in [place]? --    No data found.  Updated Vital Signs BP (!) 140/100 (BP Location: Left Arm)   Pulse 90   Temp 98.5 F (36.9 C) (Oral)   Resp 18   Ht '5\' 9"'$  (1.753 m)   Wt 84 kg   SpO2 96%   BMI 27.35 kg/m   Visual Acuity Right Eye Distance:   Left Eye Distance:   Bilateral Distance:    Right Eye Near:   Left Eye Near:    Bilateral Near:     Physical Exam Vitals and nursing note reviewed.  Constitutional:      General: She is not in acute distress.    Appearance: Normal appearance. She is not ill-appearing or toxic-appearing.     Comments: Sitting in wheelchair.  Unable to ambulate.  With the assistance of the staff and her son we were able to have her stand so I could evaluate the posterior aspect of her lower extremity.  Her balance was extremely poor and she felt like she was going to fall.  Somewhat of a limited exam.  Skin:    General: Skin is warm.     Capillary Refill: Capillary refill takes 2 to 3 seconds.     Comments: Right lower extremity: Patient has 2 weeping wounds one posterior in the mid calf area and has an ulcerated appearance.  The second one is anteriorly over the tibialis anterior muscle belly proximally.  There is some mild erythema in the lower extremity.  I do not believe this is truly cellulitis, however she may be developing an early infection.  Seems most consistent with  hemosiderin deposits.  She is extremely tender in multiple spots in the lower extremity.  It appears as though her calf is somewhat larger than the contralateral side.  Due to the wounds I was unable to get a full evaluation of the circumference of the calf.  She does have distal pulses and her capillary refill is 2 to 3 seconds.  Neurological:     Mental Status: She is alert. She is disoriented.      UC Treatments / Results  Labs (all labs ordered are listed, but only abnormal results are displayed) Labs Reviewed - No data to display  EKG   Radiology US Venous Img Lower Unilateral Right  Result Date: 11/01/2020 CLINICAL DATA:  Right lower extremity edema and erythema. EXAM: RIGHT LOWER EXTREMITY VENOUS DOPPLER ULTRASOUND TECHNIQUE: Gray-scale sonography with graded compression, as well as color Doppler and duplex ultrasound were performed to evaluate the lower extremity deep venous systems from the level of the common femoral vein and including the common femoral, femoral, profunda femoral, popliteal and calf veins including the posterior tibial, peroneal and gastrocnemius veins when visible. The superficial great saphenous vein was also interrogated. Spectral Doppler was utilized to evaluate flow at rest and with distal augmentation maneuvers in the common femoral, femoral and popliteal veins. COMPARISON:  None. FINDINGS: Contralateral Common Femoral Vein: Respiratory phasicity is normal and symmetric with the symptomatic side. No evidence of thrombus. Normal compressibility. Common Femoral Vein: No evidence of thrombus. Normal compressibility, respiratory phasicity and response to augmentation. Saphenofemoral Junction: No evidence of thrombus. Normal compressibility and flow on color Doppler imaging. Profunda Femoral Vein: No evidence of thrombus. Normal compressibility and flow on color Doppler imaging. Femoral Vein: No evidence of thrombus. Normal compressibility, respiratory phasicity and  response to augmentation. Popliteal Vein: No evidence of thrombus. Normal compressibility, respiratory  phasicity and response to augmentation. Calf Veins: No evidence of thrombus. Normal compressibility and flow on color Doppler imaging. Superficial Great Saphenous Vein: No evidence of thrombus. Normal compressibility. Venous Reflux:  None. Other Findings: Fluid collection in the right popliteal fossa measuring up to 5 cm in greatest length most likely represents a Baker's cyst. Mildly prominent right inguinal lymph node measures up to 1.4 cm in short axis. This may be a reactive node. IMPRESSION: 1. No evidence of right lower extremity deep vein thrombosis. 2. Right popliteal fossa Baker's cyst. 3. Mildly prominent right inguinal lymph node may be a reactive lymph node. Electronically Signed   By: Aletta Edouard M.D.   On: 11/01/2020 15:16    Procedures Procedures (including critical care time)  Medications Ordered in UC Medications - No data to display  Initial Impression / Assessment and Plan / UC Course  I have reviewed the triage vital signs and the nursing notes.  Pertinent labs & imaging results that were available during my care of the patient were reviewed by me and considered in my medical decision making (see chart for details).  Clinical impression: Right lower extremity wounds in 2 areas.  Suspect she has some arterial disease.  She has some swelling and cannot fully rule out DVT.  I do believe she has a significant infection but may be developing early cellulitis.  There is a little bit of an odor to the wounds so we will treat accordingly.  She does have wound care scheduled for next week.  Treatment plan: 1.  The findings and treatment plan were discussed in detail with the patient and her son.  All parties were in agreement and voiced verbal understanding. I did want to get a lower extremity ultrasound as well as a Doppler.  Unfortunately we were unable to obtain the arterial  study but will get the venous study to rule out a DVT.  Results are above show no DVT. 2.  Will empirically treat her on Keflex for presumed early infection.  Given her renal function we will renal dose at twice daily.  The wounds are weeping so I did not send off a culture and they do not appear to be deep in nature.  There is no significant cellulitis appreciated on exam. 3.  We will dress the wounds and have the facility there where she is staying change them daily to twice daily and keep a close eye on them keeping them as clean as possible and as dry as possible until she sees wound care next week. 4.  Educational handouts were provided. 5.  I gave her a work note, even though she is not working, with instructions for the facility where she is staying so that they can adequately treat her wounds. 6.  Follow-up here as needed.    Final Clinical Impressions(s) / UC Diagnoses   Final diagnoses:  Wound drainage  Pressure injury of right calf, stage 2 (HCC)  Wound infection  Swelling of calf     Discharge Instructions     The results of your ultrasound showed no blood clot. Please follow-up with wound care next week. I have put you on an antibiotic for presumed early infection.  Due to your renal function you can only take this twice a day.  Please pick that up at the pharmacy or if it is given by the facility where you live then they can get the instructions from the pharmacy. I gave you a note for the facility  where you are staying so that they can change her wound and document what it looks like twice a day until you get into wound care for further instructions. Please keep the wounds clean and dry as much as possible.    ED Prescriptions    Medication Sig Dispense Auth. Provider   cephALEXin (KEFLEX) 500 MG capsule Take 1 capsule (500 mg total) by mouth 2 (two) times daily. 20 capsule Verda Cumins, MD     PDMP not reviewed this encounter.   Verda Cumins, MD 11/01/20  1530

## 2020-11-01 NOTE — Discharge Instructions (Addendum)
The results of your ultrasound showed no blood clot. Please follow-up with wound care next week. I have put you on an antibiotic for presumed early infection.  Due to your renal function you can only take this twice a day.  Please pick that up at the pharmacy or if it is given by the facility where you live then they can get the instructions from the pharmacy. I gave you a note for the facility where you are staying so that they can change her wound and document what it looks like twice a day until you get into wound care for further instructions. Please keep the wounds clean and dry as much as possible.

## 2020-11-06 ENCOUNTER — Ambulatory Visit: Payer: Medicare Other | Admitting: Family

## 2020-11-07 ENCOUNTER — Other Ambulatory Visit
Admission: RE | Admit: 2020-11-07 | Discharge: 2020-11-07 | Disposition: A | Discharge status: Home / Self Care | Payer: Medicare Other | Source: Ambulatory Visit | Attending: Physician Assistant | Admitting: Physician Assistant

## 2020-11-07 ENCOUNTER — Encounter: Payer: Medicare Other | Attending: Physician Assistant | Admitting: Physician Assistant

## 2020-11-07 ENCOUNTER — Other Ambulatory Visit: Payer: Self-pay

## 2020-11-07 DIAGNOSIS — N186 End stage renal disease: Secondary | ICD-10-CM | POA: Diagnosis not present

## 2020-11-07 DIAGNOSIS — Z87891 Personal history of nicotine dependence: Secondary | ICD-10-CM | POA: Diagnosis not present

## 2020-11-07 DIAGNOSIS — L089 Local infection of the skin and subcutaneous tissue, unspecified: Secondary | ICD-10-CM | POA: Insufficient documentation

## 2020-11-07 DIAGNOSIS — S81801A Unspecified open wound, right lower leg, initial encounter: Secondary | ICD-10-CM | POA: Diagnosis not present

## 2020-11-07 DIAGNOSIS — I480 Paroxysmal atrial fibrillation: Secondary | ICD-10-CM | POA: Insufficient documentation

## 2020-11-07 DIAGNOSIS — Z882 Allergy status to sulfonamides status: Secondary | ICD-10-CM | POA: Diagnosis not present

## 2020-11-07 DIAGNOSIS — X58XXXA Exposure to other specified factors, initial encounter: Secondary | ICD-10-CM | POA: Insufficient documentation

## 2020-11-07 DIAGNOSIS — I872 Venous insufficiency (chronic) (peripheral): Secondary | ICD-10-CM | POA: Insufficient documentation

## 2020-11-07 DIAGNOSIS — I5042 Chronic combined systolic (congestive) and diastolic (congestive) heart failure: Secondary | ICD-10-CM | POA: Diagnosis not present

## 2020-11-07 DIAGNOSIS — L97812 Non-pressure chronic ulcer of other part of right lower leg with fat layer exposed: Secondary | ICD-10-CM | POA: Diagnosis not present

## 2020-11-07 DIAGNOSIS — I132 Hypertensive heart and chronic kidney disease with heart failure and with stage 5 chronic kidney disease, or end stage renal disease: Secondary | ICD-10-CM | POA: Insufficient documentation

## 2020-11-08 NOTE — Progress Notes (Signed)
Felicia [name], Felicia [name] ([id number]) Visit Report for 11/07/2020 Abuse/Suicide Risk Screen Details Patient Name: Felicia [name], Felicia [name] Date of Service: 11/07/2020 10:45 AM Medical Record Number: [id number] Patient Account Number: [id number] Date of Birth/Sex: Sep 22, 1928 (87 y.o. F) Treating RN: Dolan Amen Primary Care Aydin Hink: Thereasa Distance Other Clinician: Jeanine Luz Referring Rommie Dunn: Nolon Stalls Treating Zainah Steven/Extender: Skipper Cliche in Treatment: 0 Abuse/Suicide Risk Screen Items Answer ABUSE RISK SCREEN: Has anyone close to you tried to hurt or harm you recentlyo No Do you feel uncomfortable with anyone in your familyo No Has anyone forced you do things that you didnot want to doo No Electronic Signature(s) Signed: 11/08/2020 8:01:22 AM By: Jeanine Luz Signed: 11/08/2020 9:05:11 AM By: Georges Mouse, Minus Breeding RN Entered By: Jeanine Luz on 11/07/2020 11:08:27 Felicia [name] ([id number]) -------------------------------------------------------------------------------- Activities of Daily Living Details Patient Name: Felicia [name] Date of Service: 11/07/2020 10:45 AM Medical Record Number: [id number] Patient Account Number: [id number] Date of Birth/Sex: October 27, 1928 (87 y.o. F) Treating RN: Dolan Amen Primary Care Marji Kuehnel: Thereasa Distance Other Clinician: Jeanine Luz Referring Dametri Ozburn: Nolon Stalls Treating Benett Swoyer/Extender: Skipper Cliche in Treatment: 0 Activities of Daily Living Items Answer Activities of Daily Living (Please select one for each item) Drive Automobile Not Able Take Medications Need Assistance Use Telephone Need Assistance Care for Appearance Need Assistance Use Toilet Need Assistance Bath / Shower Need Assistance Dress Self Need Assistance Feed Self Need Assistance Walk Need Assistance Get In / Out Bed Need Assistance Housework Need Assistance Prepare Meals Need Assistance Handle Money Need  Assistance Shop for Self Need Assistance Electronic Signature(s) Signed: 11/08/2020 8:01:22 AM By: Jeanine Luz Signed: 11/08/2020 9:05:11 AM By: Georges Mouse, Minus Breeding RN Entered By: Jeanine Luz on 11/07/2020 11:09:09 Felicia [name] ([id number]) -------------------------------------------------------------------------------- Education Screening Details Patient Name: Felicia [name] Date of Service: 11/07/2020 10:45 AM Medical Record Number: [id number] Patient Account Number: [id number] Date of Birth/Sex: 01-20-1929 (87 y.o. F) Treating RN: Dolan Amen Primary Care Deztinee Lohmeyer: Thereasa Distance Other Clinician: Jeanine Luz Referring Numair Masden: Nolon Stalls Treating Notnamed Scholz/Extender: Skipper Cliche in Treatment: 0 Primary Learner Assessed: Patient Learning Preferences/Education Level/Primary Language Learning Preference: Explanation, Demonstration Highest Education Level: High School Preferred Language: English Cognitive Barrier Language Barrier: No Translator Needed: No Memory Deficit: No Emotional Barrier: No Cultural/Religious Beliefs Affecting Medical Care: No Physical Barrier Impaired Vision: No Impaired Hearing: No Decreased Hand dexterity: No Knowledge/Comprehension Knowledge Level: Medium Comprehension Level: Medium Ability to understand written instructions: Medium Ability to understand verbal instructions: Medium Motivation Anxiety Level: Calm Cooperation: Cooperative Education Importance: Acknowledges Need Interest in Health Problems: Asks Questions Perception: Coherent Willingness to Engage in Self-Management Medium Activities: Readiness to Engage in Self-Management Medium Activities: Electronic Signature(s) Signed: 11/08/2020 8:01:22 AM By: Jeanine Luz Signed: 11/08/2020 9:05:11 AM By: Georges Mouse, Minus Breeding RN Entered By: Jeanine Luz on 11/07/2020 11:09:53 Felicia [name]  ([id number]) -------------------------------------------------------------------------------- Fall Risk Assessment Details Patient Name: Felicia [name] Date of Service: 11/07/2020 10:45 AM Medical Record Number: [id number] Patient Account Number: [id number] Date of Birth/Sex: December 04, 1928 (87 y.o. F) Treating RN: Dolan Amen Primary Care Chasta Deshpande: Thereasa Distance Other Clinician: Jeanine Luz Referring Kolter Reaver: Nolon Stalls Treating Sevag Shearn/Extender: Skipper Cliche in Treatment: 0 Fall Risk Assessment Items Have you had 2 or more falls in the last 12 monthso 0 Yes Have you had any fall that resulted in injury in the last 12 monthso 0 Yes FALLS RISK SCREEN History of falling - immediate or within 3 months 0 No Secondary diagnosis (Do you have 2 or more medical  diagnoseso) 15 Yes Ambulatory aid None/bed rest/wheelchair/nurse 0 No Crutches/cane/walker 0 No Furniture 0 No Intravenous therapy Access/Saline/Heparin Lock 0 No Gait/Transferring Normal/ bed rest/ wheelchair 0 No Weak (short steps with or without shuffle, stooped but able to lift head while walking, may 0 No seek support from furniture) Impaired (short steps with shuffle, may have difficulty arising from chair, head down, impaired 0 No balance) Mental Status Oriented to own ability 0 No Electronic Signature(s) Signed: 11/08/2020 8:01:22 AM By: Jeanine Luz Signed: 11/08/2020 9:05:11 AM By: Georges Mouse, Minus Breeding RN Entered By: Jeanine Luz on 11/07/2020 11:10:07 Felicia [name] ([id number]) -------------------------------------------------------------------------------- Foot Assessment Details Patient Name: Felicia [name] Date of Service: 11/07/2020 10:45 AM Medical Record Number: [id number] Patient Account Number: [id number] Date of Birth/Sex: 1928/11/12 (87 y.o. F) Treating RN: Dolan Amen Primary Care Jahyra Sukup: Thereasa Distance Other Clinician: Jeanine Luz Referring  Emmerson Shuffield: Nolon Stalls Treating Paulyne Mooty/Extender: Skipper Cliche in Treatment: 0 Foot Assessment Items Site Locations + = Sensation present, - = Sensation absent, C = Callus, U = Ulcer R = Redness, W = Warmth, M = Maceration, PU = Pre-ulcerative lesion F = Fissure, S = Swelling, D = Dryness Assessment Right: Left: Other Deformity: No No Prior Foot Ulcer: No No Prior Amputation: No No Charcot Joint: No No Ambulatory Status: Gait: Electronic Signature(s) Signed: 11/08/2020 8:01:22 AM By: Jeanine Luz Signed: 11/08/2020 9:05:11 AM By: Georges Mouse, Minus Breeding RN Entered By: Jeanine Luz on 11/07/2020 11:15:45 Felicia [name], Felicia Staff ([id number]) -------------------------------------------------------------------------------- Nutrition Risk Screening Details Patient Name: Felicia [name] Date of Service: 11/07/2020 10:45 AM Medical Record Number: [id number] Patient Account Number: [id number] Date of Birth/Sex: April 06, 1929 (87 y.o. F) Treating RN: Dolan Amen Primary Care Jaterrius Ricketson: Thereasa Distance Other Clinician: Jeanine Luz Referring Avannah Decker: Nolon Stalls Treating Cale Bethard/Extender: Skipper Cliche in Treatment: 0 Height (in): Weight (lbs): Body Mass Index (BMI): Nutrition Risk Screening Items Score Screening NUTRITION RISK SCREEN: I have an illness or condition that made me change the kind and/or amount of food I eat 0 No I eat fewer than two meals per day 3 Yes I eat few fruits and vegetables, or milk products 0 No I have three or more drinks of beer, liquor or wine almost every day 0 No I have tooth or mouth problems that make it hard for me to eat 0 No I don't always have enough money to buy the food I need 0 No I eat alone most of the time 0 No I take three or more different prescribed or over-the-counter drugs a day 0 No Without wanting to, I have lost or gained 10 pounds in the last six months 0 No I am not always physically able to shop,  cook and/or feed myself 0 No Nutrition Protocols Good Risk Protocol Moderate Risk Protocol 0 Provide education on nutrition High Risk Proctocol Risk Level: Moderate Risk Score: 3 Electronic Signature(s) Signed: 11/08/2020 8:01:22 AM By: Jeanine Luz Signed: 11/08/2020 9:05:11 AM By: Georges Mouse, Minus Breeding RN Entered By: Jeanine Luz on 11/07/2020 11:11:44

## 2020-11-08 NOTE — Progress Notes (Signed)
KENYIA, ROYLE ([id number]) Visit Report for 11/07/2020 Chief Complaint Document Details Patient Name: Felicia [name], Felicia [name] Date of Service: 11/07/2020 10:45 AM Medical Record Number: [id number] Patient Account Number: [id number] Date of Birth/Sex: 1929/06/27 (87 y.o. F) Treating RN: Dolan Amen Primary Care Provider: Thereasa Distance Other Clinician: Jeanine Luz Referring Provider: Nolon Stalls Treating Provider/Extender: Skipper Cliche in Treatment: 0 Information Obtained from: Patient Chief Complaint Right LE Ulcers Electronic Signature(s) Signed: 11/07/2020 11:55:42 AM By: Worthy Keeler PA-C Entered By: Worthy Keeler on 11/07/2020 11:55:42 Ramthun, Marvene Staff ([id number]) -------------------------------------------------------------------------------- HPI Details Patient Name: Felicia [name] Date of Service: 11/07/2020 10:45 AM Medical Record Number: [id number] Patient Account Number: [id number] Date of Birth/Sex: [birth year] (87 y.o. F) Treating RN: Dolan Amen Primary Care Provider: Thereasa Distance Other Clinician: Jeanine Luz Referring Provider: Nolon Stalls Treating Provider/Extender: Skipper Cliche in Treatment: 0 History of Present Illness HPI Description: Pleasant 87 year old with past medical history of chronic venous insufficiency. She says that she underwent a left greater saphenous vein stripping by Dr. Lucky Cowboy over 5 years ago. She subsequently developed a left medial calf venous stasis ulcer which healed with compression. She has been wearing 20-30 mm compression stockings but developed a recurrent ulceration. She has declined compression bandages. She continues to wear her compression stockings and is undergoing dressing changes with cadexomer iodine. She is ambulating normally per her baseline. She denies any significant pain. No claudication or rest pain. ABI 0.71. Declined vascular surgery evaluation. No fever or chills  or significant drainage. Readmission: 11/08/2019 upon evaluation today patient appears to be doing somewhat poorly in regard to her wound which is on the right lateral leg. She also has 1 on the posterior right lower leg. These are traumatic in nature I am told from where she fell and/or bumped the area. Fortunately there does not appear to be any signs of active infection at this time which is good news in my opinion. Nonetheless I am concerned about the fact that obviously the patient's wounds are little bit eschar covered and slough covered and she actually has blue/green drainage noted as well from the wounds. This course is indication of Pseudomonas. And based on what I am seeing I believe that the Pseudomonas likely needs to be handled before everything else is going to see signs of improvement. Also I think that the patient is going to require IV and topical antibiotic therapy. Eventually we do need to perform some sharp debridement as well but again the main issue currently is that we need to get this infection under control. No fevers, chills, nausea, vomiting, or diarrhea. Patient has a history of chronic venous insufficiency, atrial fibrillation, and congestive heart failure. Electronic Signature(s) Signed: 11/08/2020 8:10:33 AM By: Worthy Keeler PA-C Entered By: Worthy Keeler on 11/08/2020 08:10:32 Felicia [name] ([id number]) -------------------------------------------------------------------------------- Physical Exam Details Patient Name: Felicia [name] Date of Service: 11/07/2020 10:45 AM Medical Record Number: [id number] Patient Account Number: [id number] Date of Birth/Sex: August 08, 1929 (87 y.o. F) Treating RN: Dolan Amen Primary Care Provider: Thereasa Distance Other Clinician: Jeanine Luz Referring Provider: Nolon Stalls Treating Provider/Extender: Skipper Cliche in Treatment: 0 Constitutional sitting or standing blood pressure is within target  range for patient.. pulse regular and within target range for patient.Marland Kitchen respirations regular, non- labored and within target range for patient.Marland Kitchen temperature within target range for patient.. Well-nourished and well-hydrated in no acute distress. Eyes conjunctiva clear no eyelid edema noted. pupils equal round and reactive to light and accommodation. Ears,  Nose, Mouth, and Throat no gross abnormality of ear auricles or external auditory canals. normal hearing noted during conversation. mucus membranes moist. Respiratory normal breathing without difficulty. Cardiovascular no clubbing, cyanosis, significant edema, <3 sec cap refill. Musculoskeletal Patient unable to walk without assistance. no significant deformity or arthritic changes, no loss or range of motion, no clubbing. Psychiatric this patient is able to make decisions and demonstrates good insight into disease process. Alert and Oriented x 3. pleasant and cooperative. Notes Upon inspection patient's wound bed actually showed signs of slough on the surface of the wound. She also has purulent drainage which is blue- green in nature and consistent with Pseudomonas. Fortunately there does not appear to be any signs of active infection at this time. No fevers, chills, nausea, vomiting, or diarrhea. Electronic Signature(s) Signed: 11/08/2020 8:11:11 AM By: Worthy Keeler PA-C Entered By: Worthy Keeler on 11/08/2020 08:11:11 Felicia [name] ([id number]) -------------------------------------------------------------------------------- Physician Orders Details Patient Name: Felicia [name] Date of Service: 11/07/2020 10:45 AM Medical Record Number: [id number] Patient Account Number: [id number] Date of Birth/Sex: [birth year] (87 y.o. 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 Edema Control - Lymphedema / Segmental Compressive Device / Other o Elevate, Exercise Daily and Avoid Standing for Long Periods of Time. o Elevate legs to the level of the heart and pump ankles as often as possible o Elevate leg(s) parallel to the floor when sitting. Off-Loading o Turn and reposition every 2 hours Wound Treatment Wound #4 - Lower Leg Wound Laterality: Right, Lateral Cleanser: Normal Saline (Generic) 3 x Per Week/30 Days Discharge Instructions: Wash your hands with soap and water. Remove old dressing, discard into plastic bag and place into trash. Cleanse the wound with Normal Saline prior to applying a clean dressing using gauze sponges, not tissues or cotton balls. Do not scrub or use excessive force. Pat dry using gauze sponges, not tissue or cotton balls. Topical: Gentamicin (Generic) 3 x Per Week/30 Days Discharge Instructions: apply to wound bed Primary Dressing: Silvercel Small 2x2 (in/in) (Generic) 3 x Per Week/30 Days Discharge Instructions: Apply Silvercel Small 2x2 (in/in) as instructed Secondary Dressing: Mepilex Border Flex, 4x4 (in/in) (Generic) 3 x Per Week/30 Days Discharge Instructions: Apply to wound as directed. Do not cut. Wound #5 - Lower Leg Wound Laterality: Right, Posterior Cleanser: Normal Saline (Generic) 3 x Per Week/30 Days Discharge Instructions: Wash your hands with soap and water. Remove old dressing, discard into plastic bag and place into trash. Cleanse the wound with Normal Saline prior to applying a clean dressing  using gauze sponges, not tissues or cotton balls. Do not scrub or use excessive force. Pat dry using gauze sponges, not tissue or cotton balls. Topical: Gentamicin (Generic) 3 x Per Week/30 Days Discharge Instructions: apply to wound bed Primary Dressing: Silvercel Small 2x2 (in/in) (Generic) 3 x Per Week/30 Days Discharge Instructions: Apply Silvercel Small 2x2 (in/in) as instructed Secondary Dressing: Mepilex Border Flex, 4x4 (in/in) (Generic) 3 x Per Week/30 Days Discharge Instructions: Apply to wound as directed. Do not cut. Laboratory o Bacteria identified in  Wound by Culture (MICRO) - non healing wound with green drainage , pain, swelling, pain - (ICD10 S81.801A - Unspecified open wound, right lower leg, initial encounter) Seider, Jerrine B. ([id number]) oooo LOINC Code: [id number] oooo Convenience Name: Wound culture routine Services and Therapies o Arterial Studies- Bilateral, ABI and TBI - please obtain in house and send results to the wound center - (ICD10 S81.801A - Unspecified open wound, right lower leg, initial encounter) Electronic Signature(s) Signed: 11/08/2020 8:14:32 AM By: Worthy Keeler PA-C Signed: 11/08/2020 12:09:52 PM By: Carlene Coria RN Entered By: Carlene Coria on 11/07/2020 12:07:08 Felicia [name] ([id number]) -------------------------------------------------------------------------------- Problem List Details Patient Name: Felicia [name] Date of Service: 11/07/2020 10:45 AM Medical Record Number: [id number] Patient Account Number: [id number] Date of Birth/Sex: October 02, 1928 (87 y.o. F) Treating RN: Dolan Amen Primary Care Provider: Thereasa Distance Other Clinician: Jeanine Luz Referring Provider: Nolon Stalls Treating Provider/Extender: Skipper Cliche in Treatment: 0 Active Problems ICD-10 Encounter Code Description Active Date MDM Diagnosis S81.801A Unspecified open wound, right lower leg, initial encounter 11/07/2020 No  Yes L97.812 Non-pressure chronic ulcer of other part of right lower leg with fat layer 11/07/2020 No Yes exposed I50.42 Chronic combined systolic (congestive) and diastolic (congestive) heart 11/07/2020 No Yes failure I48.0 Paroxysmal atrial fibrillation 11/07/2020 No Yes Inactive Problems Resolved Problems Electronic Signature(s) Signed: 11/07/2020 11:55:14 AM By: Worthy Keeler PA-C Entered By: Worthy Keeler on 11/07/2020 11:55:14 Roehrich, Dynastie Jacinto Reap ([id number]) -------------------------------------------------------------------------------- Progress Note Details Patient Name: Felicia [name] Date of Service: 11/07/2020 10:45 AM Medical Record Number: [id number] Patient Account Number: [id number] Date of Birth/Sex: Nov 01, 1928 (87 y.o. F) Treating RN: Dolan Amen Primary Care Provider: Thereasa Distance Other Clinician: Jeanine Luz Referring Provider: Nolon Stalls Treating Provider/Extender: Skipper Cliche in Treatment: 0 Subjective Chief Complaint Information obtained from Patient Right LE Ulcers History of Present Illness (HPI) Pleasant 87 year old with past medical history of chronic venous insufficiency. She says that she underwent a left greater saphenous vein stripping by Dr. Lucky Cowboy over 5 years ago. She subsequently developed a left medial calf venous stasis ulcer which healed with compression. She has been wearing 20-30 mm compression stockings but developed a recurrent ulceration. She has declined compression bandages. She continues to wear her compression stockings and is undergoing dressing changes with cadexomer iodine. She is ambulating normally per her baseline. She denies any significant pain. No claudication or rest pain. ABI 0.71. Declined vascular surgery evaluation. No fever or chills or significant drainage. Readmission: 11/08/2019 upon evaluation today patient appears to be doing somewhat poorly in regard to her wound which is on the right lateral  leg. She also has 1 on the posterior right lower leg. These are traumatic in nature I am told from where she fell and/or bumped the area. Fortunately there does not appear to be any signs of active infection at this time which is good news in my opinion. Nonetheless I am concerned about the fact that obviously the patient's wounds are little bit eschar covered and slough covered and she actually has blue/green drainage noted as well from the wounds. This course is indication of Pseudomonas. And based on what I am seeing I believe that the Pseudomonas likely needs to be handled before everything else is going to see signs of improvement. Also I think that the patient is going to require IV and topical antibiotic therapy. Eventually we do need to perform some sharp debridement as well but again the main issue currently is that we need to get this  infection under control. No fevers, chills, nausea, vomiting, or diarrhea. Patient has a history of chronic venous insufficiency, atrial fibrillation, and congestive heart failure. Patient History Information obtained from Patient. Allergies sulfamethoxazole Family History Cancer - Father, Diabetes - Paternal Grandparents, Heart Disease - Mother, Hypertension - Mother, Stroke, No family history of Hereditary Spherocytosis, Kidney Disease, Lung Disease, Seizures, Thyroid Problems, Tuberculosis. Social History Former smoker - 1974 quit, Marital Status - Widowed, Alcohol Use - Never, Drug Use - No History, Caffeine Use - Daily - coffee. Medical History Eyes Denies history of Cataracts, Glaucoma, Optic Neuritis Ear/Nose/Mouth/Throat Denies history of Chronic sinus problems/congestion, Middle ear problems Hematologic/Lymphatic Patient has history of Anemia Denies history of Hemophilia, Human Immunodeficiency Virus, Lymphedema, Sickle Cell Disease Respiratory Denies history of Aspiration, Asthma, Chronic Obstructive Pulmonary Disease (COPD), Pneumothorax,  Sleep Apnea, Tuberculosis Cardiovascular Patient has history of Arrhythmia, Hypertension, Hypotension Denies history of Angina, Congestive Heart Failure, Coronary Artery Disease, Deep Vein Thrombosis, Myocardial Infarction, Peripheral Arterial Disease, Peripheral Venous Disease, Phlebitis, Vasculitis Gastrointestinal Denies history of Cirrhosis , Colitis, Crohn s, Hepatitis A, Hepatitis B, Hepatitis C Endocrine Denies history of Type I Diabetes, Type II Diabetes Genitourinary Patient has history of End Stage Renal Disease Immunological Denies history of Lupus Erythematosus, Raynaud s, Scleroderma Integumentary (Skin) Denies history of History of Burn, History of pressure wounds Musculoskeletal Patient has history of Osteoarthritis Weikel, Donnell B. ([id number]) Denies history of Gout, Rheumatoid Arthritis, Osteomyelitis Neurologic Denies history of Dementia, Neuropathy, Quadriplegia, Paraplegia, Seizure Disorder Oncologic Denies history of Received Chemotherapy, Received Radiation Psychiatric Denies history of Anorexia/bulimia, Confinement Anxiety Medical And Surgical History Notes Ear/Nose/Mouth/Throat diminished hearing right ear Cardiovascular heart murmur AFib Genitourinary stent in right kidney Neurologic carpal tunnel in left hand Review of Systems (ROS) Constitutional Symptoms (General Health) Complains or has symptoms of Fatigue. Denies complaints or symptoms of Fever, Chills, Marked Weight Change. Eyes Denies complaints or symptoms of Dry Eyes, Vision Changes, Glasses / Contacts. Ear/Nose/Mouth/Throat Denies complaints or symptoms of Difficult clearing ears, Sinusitis. Hematologic/Lymphatic Denies complaints or symptoms of Bleeding / Clotting Disorders, Human Immunodeficiency Virus. Respiratory Complains or has symptoms of Shortness of Breath. Denies complaints or symptoms of Chronic or frequent coughs, 2L of O2 Gastrointestinal Denies complaints or  symptoms of Frequent diarrhea, Nausea, Vomiting. Endocrine Denies complaints or symptoms of Hepatitis, Thyroid disease, Polydypsia (Excessive Thirst). Genitourinary Denies complaints or symptoms of Kidney failure/ Dialysis, Incontinence/dribbling. Integumentary (Skin) Denies complaints or symptoms of Wounds, Bleeding or bruising tendency, Breakdown, Swelling. Musculoskeletal Complains or has symptoms of Muscle Weakness. Denies complaints or symptoms of Muscle Pain. Neurologic Denies complaints or symptoms of Numbness/parasthesias, Focal/Weakness. Psychiatric Denies complaints or symptoms of Anxiety, Claustrophobia. Objective Constitutional sitting or standing blood pressure is within target range for patient.. pulse regular and within target range for patient.Marland Kitchen respirations regular, non- labored and within target range for patient.Marland Kitchen temperature within target range for patient.. Well-nourished and well-hydrated in no acute distress. Vitals Time Taken: 11:00 AM, Temperature: 98.2 F, Pulse: 84 bpm, Respiratory Rate: 18 breaths/min, Blood Pressure: 104/64 mmHg. Eyes conjunctiva clear no eyelid edema noted. pupils equal round and reactive to light and accommodation. Ears, Nose, Mouth, and Throat no gross abnormality of ear auricles or external auditory canals. normal hearing noted during conversation. mucus membranes moist. Respiratory normal breathing without difficulty. Cardiovascular no clubbing, cyanosis, significant edema, Musculoskeletal Patient unable to walk without assistance. no significant deformity or arthritic changes, no loss or range of motion, no clubbing. KASHLEE, NEVILS ([id number]) Psychiatric this patient is able to make decisions  and demonstrates good insight into disease process. Alert and Oriented x 3. pleasant and cooperative. General Notes: Upon inspection patient's wound bed actually showed signs of slough on the surface of the wound. She also has purulent  drainage which is blue-green in nature and consistent with Pseudomonas. Fortunately there does not appear to be any signs of active infection at this time. No fevers, chills, nausea, vomiting, or diarrhea. Integumentary (Hair, Skin) Wound #4 status is Open. Original cause of wound was Not Known. The date acquired was: 09/26/2020. The wound is located on the Right,Lateral Lower Leg. The wound measures 1.7cm length x 1.5cm width x 0.2cm depth; 2.003cm^2 area and 0.401cm^3 volume. There is Fat Layer (Subcutaneous Tissue) exposed. There is no tunneling or undermining noted. There is a medium amount of purulent drainage noted. There is small (1-33%) pink granulation within the wound bed. There is a large (67-100%) amount of necrotic tissue within the wound bed including Adherent Slough. Wound #5 status is Open. Original cause of wound was Trauma. The date acquired was: 10/25/2020. The wound is located on the Right,Posterior Lower Leg. The wound measures 1.5cm length x 1.5cm width x 0.2cm depth; 1.767cm^2 area and 0.353cm^3 volume. There is Fat Layer (Subcutaneous Tissue) exposed. There is no tunneling or undermining noted. There is a medium amount of purulent drainage noted. There is small (1-33%) red granulation within the wound bed. There is a medium (34-66%) amount of necrotic tissue within the wound bed including Adherent Slough. 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 Edema Control - Lymphedema / Segmental Compressive Device / Other: Elevate, Exercise Daily and Avoid Standing for Long Periods of Time. Elevate legs to the level of the heart and pump ankles as often as possible Elevate leg(s)  parallel to the floor when sitting. Off-Loading: Turn and reposition every 2 hours Laboratory ordered were: Wound culture routine - non healing wound with green drainage , pain, swelling, pain Services and Therapies ordered were: Arterial Studies- Bilateral, ABI and TBI - please obtain in house and send results to the wound center WOUND #4: - Lower Leg Wound Laterality: Right, Lateral Cleanser: Normal Saline (Generic) 3 x Per Week/30 Days Discharge Instructions: Wash your hands with soap and water. Remove old dressing, discard into plastic bag and place into trash. Cleanse the wound with Normal Saline prior to applying a clean dressing using gauze sponges, not tissues or cotton balls. Do not scrub or use excessive force. Pat dry using gauze sponges, not tissue or cotton balls. Topical: Gentamicin (Generic) 3 x Per Week/30 Days Discharge Instructions: apply to wound bed Primary Dressing: Silvercel Small 2x2 (in/in) (Generic) 3 x Per Week/30 Days Discharge Instructions: Apply Silvercel Small 2x2 (in/in) as instructed Secondary Dressing: Mepilex Border Flex, 4x4 (in/in) (Generic) 3 x Per Week/30 Days Discharge Instructions: Apply to wound as directed. Do not cut. WOUND #5: - Lower Leg Wound Laterality: Right, Posterior Cleanser: Normal Saline (Generic) 3 x Per Week/30 Days Discharge Instructions: Wash your hands with soap and water. Remove old dressing, discard into plastic bag and place into trash. Cleanse the wound with Normal Saline prior to applying a clean dressing using gauze sponges, not tissues or cotton balls. Do not scrub or use excessive force. Pat dry using gauze sponges, not tissue or cotton balls.  Topical: Gentamicin (Generic) 3 x Per Week/30 Days Discharge Instructions: apply to wound bed Primary Dressing: Silvercel Small 2x2 (in/in) (Generic) 3 x Per Week/30 Days Discharge Instructions: Apply Silvercel Small 2x2 (in/in) as instructed Secondary Dressing: Mepilex Border Flex,  4x4 (in/in) (Generic) 3 x Per Week/30 Days Discharge Instructions: Apply to wound as directed. Do not cut. Scearce, Aliciana B. ([id number]) 1. Would recommend currently that we go ahead and initiate treatment with topical gentamicin to the wound beds followed by silver alginate. 2. I am also can recommend that we have the patient elevate her legs much as possible to help with edema control. 3. Am getting send a prescription for Cipro as well for this patient in order to try and help with clearing up this infection. 4. I am also can recommend that we cover all the wounds with border foam dressings to help catch the drainage this should not be a border gauze dressing which is what she had on but does not catch enough in the way of drainage. We will see patient back for reevaluation in 1 week here in the clinic. If anything worsens or changes patient will contact our office for additional recommendations. Electronic Signature(s) Signed: 11/08/2020 8:12:05 AM By: Worthy Keeler PA-C Entered By: Worthy Keeler on 11/08/2020 08:12:05 Felicia [name] ([id number]) -------------------------------------------------------------------------------- ROS/PFSH Details Patient Name: Felicia [name] Date of Service: 11/07/2020 10:45 AM Medical Record Number: [id number] Patient Account Number: [id number] Date of Birth/Sex: [birth year] (87 y.o. F) Treating RN: Dolan Amen Primary Care Provider: Thereasa Distance Other Clinician: Jeanine Luz Referring Provider: Nolon Stalls Treating Provider/Extender: Skipper Cliche in Treatment: 0 Information Obtained From Patient Constitutional Symptoms (General Health) Complaints and Symptoms: Positive for: Fatigue Negative for: Fever; Chills; Marked Weight Change Eyes Complaints and Symptoms: Negative for: Dry Eyes; Vision Changes; Glasses / Contacts Medical History: Negative for: Cataracts; Glaucoma; Optic  Neuritis Ear/Nose/Mouth/Throat Complaints and Symptoms: Negative for: Difficult clearing ears; Sinusitis Medical History: Negative for: Chronic sinus problems/congestion; Middle ear problems Past Medical History Notes: diminished hearing right ear Hematologic/Lymphatic Complaints and Symptoms: Negative for: Bleeding / Clotting Disorders; Human Immunodeficiency Virus Medical History: Positive for: Anemia Negative for: Hemophilia; Human Immunodeficiency Virus; Lymphedema; Sickle Cell Disease Respiratory Complaints and Symptoms: Positive for: Shortness of Breath Negative for: Chronic or frequent coughs Review of System Notes: 2L of O2 Medical History: Negative for: Aspiration; Asthma; Chronic Obstructive Pulmonary Disease (COPD); Pneumothorax; Sleep Apnea; Tuberculosis Gastrointestinal Complaints and Symptoms: Negative for: Frequent diarrhea; Nausea; Vomiting Medical History: Negative for: Cirrhosis ; Colitis; Crohnos; Hepatitis A; Hepatitis B; Hepatitis C Endocrine Complaints and Symptoms: Negative for: Hepatitis; Thyroid disease; Polydypsia (Excessive Thirst) Medical HistoryDESTYNE, DURAN ([id number]) Negative for: Type I Diabetes; Type II Diabetes Genitourinary Complaints and Symptoms: Negative for: Kidney failure/ Dialysis; Incontinence/dribbling Medical History: Positive for: End Stage Renal Disease Past Medical History Notes: stent in right kidney Integumentary (Skin) Complaints and Symptoms: Negative for: Wounds; Bleeding or bruising tendency; Breakdown; Swelling Medical History: Negative for: History of Burn; History of pressure wounds Musculoskeletal Complaints and Symptoms: Positive for: Muscle Weakness Negative for: Muscle Pain Medical History: Positive for: Osteoarthritis Negative for: Gout; Rheumatoid Arthritis; Osteomyelitis Neurologic Complaints and Symptoms: Negative for: Numbness/parasthesias; Focal/Weakness Medical History: Negative for:  Dementia; Neuropathy; Quadriplegia; Paraplegia; Seizure Disorder Past Medical History Notes: carpal tunnel in left hand Psychiatric Complaints and Symptoms: Negative for: Anxiety; Claustrophobia Medical History: Negative for: Anorexia/bulimia; Confinement Anxiety Cardiovascular Medical History: Positive for: Arrhythmia; Hypertension; Hypotension Negative for: Angina; Congestive Heart Failure; Coronary Artery Disease;  Deep Vein Thrombosis; Myocardial Infarction; Peripheral Arterial Disease; Peripheral Venous Disease; Phlebitis; Vasculitis Past Medical History Notes: heart murmur AFib Immunological Medical History: Negative for: Lupus Erythematosus; Raynaudos; Scleroderma Oncologic Medical History: Negative for: Received Chemotherapy; Received Radiation Immunizations Pneumococcal Vaccine: CHERMAINE, BATCHER ([id number]) Received Pneumococcal Vaccination: No Implantable Devices None Family and Social History Cancer: Yes - Father; Diabetes: Yes - Paternal Grandparents; Heart Disease: Yes - Mother; Hereditary Spherocytosis: No; Hypertension: Yes - Mother; Kidney Disease: No; Lung Disease: No; Seizures: No; Stroke: Yes; Thyroid Problems: No; Tuberculosis: No; Former smoker - 1974 quit; Marital Status - Widowed; Alcohol Use: Never; Drug Use: No History; Caffeine Use: Daily - coffee; Financial Concerns: No; Food, Clothing or Shelter Needs: No; Support System Lacking: No; Transportation Concerns: No Electronic Signature(s) Signed: 11/08/2020 8:01:22 AM By: Jeanine Luz Signed: 11/08/2020 8:14:32 AM By: Worthy Keeler PA-C Signed: 11/08/2020 9:05:11 AM By: Georges Mouse, Minus Breeding RN Entered By: Jeanine Luz on 11/07/2020 11:08:16 Felicia [name] ([id number]) -------------------------------------------------------------------------------- SuperBill Details Patient Name: Felicia [name] Date of Service: 11/07/2020 Medical Record Number: [id number] Patient Account Number:  [id number] Date of Birth/Sex: 29-Mar-1929 (87 y.o. 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 I48.0 Paroxysmal atrial fibrillation Facility Procedures CPT4 Code: [id number] Description: [facility] VISIT-LEV 5 EST PT Modifier: Quantity: 1 Physician Procedures CPT4 Code: [id number] Description: [id number] - WC PHYS LEVEL 4 - NEW PT Modifier: Quantity: 1 CPT4 Code: Description: ICD-10 Diagnosis Description S81.801A Unspecified open wound, right lower leg, initial encounter L97.812 Non-pressure chronic ulcer of other part of right lower leg with fat la I50.42 Chronic combined systolic (congestive) and diastolic  (congestive) heart I48.0 Paroxysmal atrial fibrillation Modifier: yer exposed failure Quantity: Electronic Signature(s) Signed: 11/08/2020 8:12:21 AM By: Worthy Keeler PA-C Previous Signature: 11/07/2020 5:03:57 PM Version By: Gretta Cool, BSN, RN, CWS, Kim RN, BSN Entered By: Worthy Keeler on 11/08/2020 08:12:20

## 2020-11-09 NOTE — Progress Notes (Addendum)
MASHONDA, PASLAY ([id number]) Visit Report for 11/07/2020 Allergy List Details Patient Name: Felicia [name], Felicia [name] Date of Service: 11/07/2020 10:45 AM Medical Record Number: [id number] Patient Account Number: [id number] Date of Birth/Sex: 1928/11/14 (87 y.o. F) Treating RN: Dolan Amen Primary Care Woodie Degraffenreid: Thereasa Distance Other Clinician: Jeanine Luz Referring Mirca Yale: Nolon Stalls Treating Aizza Santiago/Extender: Jeri Cos Weeks in Treatment: 0 Allergies Active Allergies sulfamethoxazole Allergy Notes Electronic Signature(s) Signed: 11/08/2020 8:01:22 AM By: Jeanine Luz Entered By: Jeanine Luz on 11/07/2020 11:15:58 Felicia [name] ([id number]) -------------------------------------------------------------------------------- Arrival Information Details Patient Name: Felicia [name] Date of Service: 11/07/2020 10:45 AM Medical Record Number: [id number] Patient Account Number: [id number] Date of Birth/Sex: [birth year] (87 y.o. F) Treating RN: Dolan Amen Primary Care Marselino Slayton: Thereasa Distance Other Clinician: Jeanine Luz Referring Andora Krull: Nolon Stalls Treating Demetruis Depaul/Extender: Skipper Cliche in Treatment: 0 Visit Information Patient Arrived: Wheel Chair Arrival Time: 11:01 Accompanied By: son Transfer Assistance: EasyPivot Patient Lift Patient Identification Verified: Yes Secondary Verification Process Completed: Yes History Since Last Visit Added or deleted any medications: Yes Had a fall or experienced change in activities of daily living that may affect risk of falls: No Hospitalized since last visit: No Electronic Signature(s) Signed: 11/08/2020 8:01:22 AM By: Jeanine Luz Entered By: Jeanine Luz on 11/07/2020 11:02:40 Felicia [name] ([id number]) -------------------------------------------------------------------------------- Clinic Level of Care Assessment Details Patient Name: Felicia [name] Date of Service: 11/07/2020 10:45 AM Medical Record Number: [id number] Patient Account Number: [id number] Date of Birth/Sex: Nov 09, 1928 (87 y.o. F) Treating RN: Cornell Barman Primary Care Danilo Cappiello: Thereasa Distance Other Clinician: Jeanine Luz Referring Halston Kintz: Nolon Stalls Treating Tesla Bochicchio/Extender: Skipper Cliche in Treatment: 0 Clinic Level of Care Assessment Items TOOL 2 Quantity Score '[]'$  - Use when only an EandM is performed on the INITIAL visit 0 ASSESSMENTS - Nursing Assessment / Reassessment X - General Physical Exam (combine w/ comprehensive assessment (listed just below) when performed on new 1 20 pt. evals) X- 1 25 Comprehensive Assessment (HX, ROS, Risk Assessments, Wounds Hx, etc.) ASSESSMENTS - Wound and Skin Assessment / Reassessment '[]'$  - Simple Wound Assessment / Reassessment - one wound 0 X- 2 5 Complex Wound Assessment / Reassessment - multiple wounds '[]'$  - 0 Dermatologic / Skin Assessment (not related to wound area) ASSESSMENTS - Ostomy and/or Continence Assessment and Care '[]'$  - Incontinence Assessment and Management 0 '[]'$  - 0 Ostomy Care Assessment and Management (repouching, etc.) PROCESS - Coordination of Care X - Simple Patient / Family Education for ongoing care 1 15 '[]'$  - 0 Complex (extensive) Patient / Family Education for ongoing care X- 1 10 [name] obtains Programmer, systems, Records, Test Results / Process Orders '[]'$  - 0 [name] telephones HHA, Nursing Homes / Clarify orders / etc '[]'$  - 0 Routine Transfer to another Facility (non-emergent condition) '[]'$  - 0 Routine Hospital Admission (non-emergent condition) X- 1 15 New Admissions / Biomedical engineer / Ordering NPWT, Apligraf, etc. '[]'$  - 0 Emergency Hospital Admission (emergent condition) X- 1 10 Simple Discharge Coordination '[]'$  - 0 Complex (extensive) Discharge Coordination PROCESS - Special Needs '[]'$  - Pediatric / Minor Patient Management 0 '[]'$  - 0 Isolation Patient Management '[]'$  -  0 Hearing / Language / Visual special needs '[]'$  - 0 Assessment of Community assistance (transportation, D/C planning, etc.) '[]'$  - 0 Additional assistance / Altered mentation '[]'$  - 0 Support Surface(s) Assessment (bed, cushion, seat, etc.) INTERVENTIONS - Wound Cleansing / Measurement X - Wound Imaging (photographs - any number of wounds) 1 5 '[]'$  - 0 Wound Tracing (instead of photographs) '[]'$  -  0 Simple Wound Measurement - one wound X- 2 5 Complex Wound Measurement - multiple wounds Felicia [name], Felicia B. ([id number]) '[]'$  - 0 Simple Wound Cleansing - one wound X- 2 5 Complex Wound Cleansing - multiple wounds INTERVENTIONS - Wound Dressings '[]'$  - Small Wound Dressing one or multiple wounds 0 X- 2 15 Medium Wound Dressing one or multiple wounds '[]'$  - 0 Large Wound Dressing one or multiple wounds '[]'$  - 0 Application of Medications - injection INTERVENTIONS - Miscellaneous '[]'$  - External ear exam 0 '[]'$  - 0 Specimen Collection (cultures, biopsies, blood, body fluids, etc.) '[]'$  - 0 Specimen(s) / Culture(s) sent or taken to Lab for analysis '[]'$  - 0 Patient Transfer (multiple [name] / Civil Service fast streamer / Similar devices) '[]'$  - 0 Simple Staple / Suture removal (25 or less) '[]'$  - 0 Complex Staple / Suture removal (26 or more) '[]'$  - 0 Hypo / Hyperglycemic Management (close monitor of Blood Glucose) X- 1 15 Ankle / Brachial Index (ABI) - do not check if billed separately Has the patient been seen at the hospital within the last three years: Yes Total Score: 175 Level Of Care: New/Established - Level 5 Electronic Signature(s) Signed: 11/08/2020 5:32:24 PM By: Gretta Cool, BSN, RN, CWS, Kim RN, BSN Entered By: Gretta Cool, BSN, RN, CWS, Kim on 11/07/2020 17:03:33 Felicia [name] ([id number]) -------------------------------------------------------------------------------- Lower Extremity Assessment Details Patient Name: Felicia [name] Date of Service: 11/07/2020 10:45 AM Medical Record Number:  [id number] Patient Account Number: [id number] Date of Birth/Sex: 03-Aug-1929 (87 y.o. F) Treating RN: Carlene Coria Primary Care Lynett Brasil: Thereasa Distance Other Clinician: Jeanine Luz Referring Quang Thorpe: Nolon Stalls Treating Modine Oppenheimer/Extender: Skipper Cliche in Treatment: 0 Edema Assessment Assessed: [Left: No] [Right: Yes] Edema: [Left: Yes] [Right: Yes] Calf Left: Right: Point of Measurement: 33 cm From Medial Instep 36.7 cm 37.8 cm Ankle Left: Right: Point of Measurement: 11 cm From Medial Instep 22 cm 21.7 cm Vascular Assessment Pulses: Dorsalis Pedis Palpable: [Left:Yes] [Right:Yes] Blood Pressure: Brachial: [Right:140] Ankle: [Right:Posterior Tibial: 90 0.64] Electronic Signature(s) Signed: 11/08/2020 12:09:52 PM By: Carlene Coria RN Entered By: Carlene Coria on 11/07/2020 11:50:36 Felicia [name], Felicia [name] ([id number]) -------------------------------------------------------------------------------- Multi Wound Chart Details Patient Name: Felicia [name] Date of Service: 11/07/2020 10:45 AM Medical Record Number: [id number] Patient Account Number: [id number] Date of Birth/Sex: 11-25-1928 (87 y.o. F) Treating RN: Carlene Coria Primary Care Marivel Mcclarty: Thereasa Distance Other Clinician: Jeanine Luz Referring Kaileigh Viswanathan: Nolon Stalls Treating Nela Bascom/Extender: Skipper Cliche in Treatment: 0 Vital Signs Height(in): Pulse(bpm): 71 Weight(lbs): Blood Pressure(mmHg): 104/64 Body Mass Index(BMI): Temperature(F): 98.2 Respiratory Rate(breaths/min): 18 Photos: [N/A:N/A] Wound Location: Right, Lateral Lower Leg Right, Posterior Lower Leg N/A Wounding Event: Not Known Trauma N/A Primary Etiology: Trauma, Other Trauma, Other N/A Comorbid History: Anemia, Arrhythmia, Hypertension, Anemia, Arrhythmia, Hypertension, N/A Hypotension, End Stage Renal Hypotension, End Stage Renal Disease, Osteoarthritis Disease, Osteoarthritis Date Acquired: 09/26/2020  10/25/2020 N/A Weeks of Treatment: 0 0 N/A Wound Status: Open Open N/A Measurements L x W x D (cm) 1.7x1.5x0.2 1.5x1.5x0.2 N/A Area (cm) : 2.003 1.767 N/A Volume (cm) : 0.401 0.353 N/A % Reduction in Area: 0.00% 0.00% N/A % Reduction in Volume: 0.00% 0.00% N/A Classification: Full Thickness Without Exposed Full Thickness Without Exposed N/A Support Structures Support Structures Exudate Amount: Medium Medium N/A Exudate Type: Purulent Purulent N/A Exudate Color: yellow, brown, green yellow, brown, green N/A Granulation Amount: Small (1-33%) Small (1-33%) N/A Granulation Quality: Pink Red N/A Necrotic Amount: Large (67-100%) Medium (34-66%) N/A Exposed Structures: Fat Layer (Subcutaneous Tissue): Fat Layer (Subcutaneous Tissue): N/A  Yes Yes Fascia: No Fascia: No Tendon: No Tendon: No Muscle: No Muscle: No Joint: No Joint: No Bone: No Bone: No Epithelialization: None None N/A Treatment Notes Electronic Signature(s) Signed: 11/08/2020 12:09:52 PM By: Carlene Coria RN Entered By: Carlene Coria on 11/07/2020 12:01:46 Felicia [name] ([id number]) -------------------------------------------------------------------------------- Multi-Disciplinary Care Plan Details Patient Name: Felicia [name] Date of Service: 11/07/2020 10:45 AM Medical Record Number: [id number] Patient Account Number: [id number] Date of Birth/Sex: 1928/10/16 (87 y.o. F) Treating RN: Carlene Coria Primary Care Emaleigh Guimond: Thereasa Distance Other Clinician: Jeanine Luz Referring Elwanda Moger: Nolon Stalls Treating Haydn Cush/Extender: Skipper Cliche in Treatment: 0 Active Inactive Electronic Signature(s) Signed: 11/13/2020 4:20:25 PM By: Georges Mouse, Minus Breeding RN Signed: 12/08/2020 8:11:40 AM By: Carlene Coria RN Previous Signature: 11/08/2020 12:09:52 PM Version By: Carlene Coria RN Entered By: Georges Mouse, Minus Breeding on 11/13/2020 16:20:25 Felicia [name]  ([id number]) -------------------------------------------------------------------------------- Pain Assessment Details Patient Name: Felicia [name] Date of Service: 11/07/2020 10:45 AM Medical Record Number: [id number] Patient Account Number: [id number] Date of Birth/Sex: 02/22/1929 (87 y.o. F) Treating RN: Dolan Amen Primary Care Takashi Korol: Thereasa Distance Other Clinician: Jeanine Luz Referring Archita Lomeli: Nolon Stalls Treating Brennen Camper/Extender: Skipper Cliche in Treatment: 0 Active Problems Location of Pain Severity and Description of Pain Patient Has Paino Yes Site Locations Rate the pain. Current Pain Level: 10 Pain Management and Medication Current Pain Management: Electronic Signature(s) Signed: 11/08/2020 8:01:22 AM By: Jeanine Luz Signed: 11/08/2020 9:05:11 AM By: Georges Mouse, Minus Breeding RN Entered By: Jeanine Luz on 11/07/2020 11:03:08 Felicia [name] ([id number]) -------------------------------------------------------------------------------- Patient/Caregiver Education Details Patient Name: Felicia [name] Date of Service: 11/07/2020 10:45 AM Medical Record Number: [id number] Patient Account Number: [id number] Date of Birth/Gender: [birth year] (87 y.o. F) Treating RN: Cornell Barman Primary Care Physician: Thereasa Distance Other Clinician: Jeanine Luz Referring Physician: Nolon Stalls Treating Physician/Extender: Skipper Cliche in Treatment: 0 Education Assessment Education Provided To: Patient Education Topics Provided Venous: Handouts: Other: referral made for vascular studies Methods: Explain/Verbal Responses: State content correctly Wound/Skin Impairment: Handouts: Caring for Your Ulcer Methods: Demonstration, Explain/Verbal Responses: State content correctly Electronic Signature(s) Signed: 11/08/2020 5:32:24 PM By: Gretta Cool, BSN, RN, CWS, Kim RN, BSN Entered By: Gretta Cool, BSN, RN, CWS, Kim on 11/07/2020  17:04:41 Felicia [name] ([id number]) -------------------------------------------------------------------------------- Wound Assessment Details Patient Name: Felicia [name] Date of Service: 11/07/2020 10:45 AM Medical Record Number: [id number] Patient Account Number: [id number] Date of Birth/Sex: [birth year] (87 y.o. F) Treating RN: Dolan Amen Primary Care Daksh Coates: Thereasa Distance Other Clinician: Jeanine Luz Referring Elber Galyean: Nolon Stalls Treating Rodgerick Gilliand/Extender: Skipper Cliche in Treatment: 0 Wound Status Wound Number: 4 Primary Trauma, Other Etiology: Wound Location: Right, Lateral Lower Leg Wound Open Wounding Event: Not Known Status: Date Acquired: 09/26/2020 Comorbid Anemia, Arrhythmia, Hypertension, Hypotension, End Weeks Of Treatment: 0 History: Stage Renal Disease, Osteoarthritis Clustered Wound: No Photos Wound Measurements Length: (cm) 1.7 Width: (cm) 1.5 Depth: (cm) 0.2 Area: (cm) 2.003 Volume: (cm) 0.401 % Reduction in Area: 0% % Reduction in Volume: 0% Epithelialization: None Tunneling: No Undermining: No Wound Description Classification: Full Thickness Without Exposed Support Structu Exudate Amount: Medium Exudate Type: Purulent Exudate Color: yellow, brown, green res Foul Odor After Cleansing: No Slough/Fibrino Yes Wound Bed Granulation Amount: Small (1-33%) Exposed Structure Granulation Quality: Pink Fascia Exposed: No Necrotic Amount: Large (67-100%) Fat Layer (Subcutaneous Tissue) Exposed: Yes Necrotic Quality: Adherent Slough Tendon Exposed: No Muscle Exposed: No Joint Exposed: No Bone Exposed: No Electronic Signature(s) Signed: 11/08/2020 8:01:22 AM By: Jeanine Luz Signed: 11/08/2020 9:05:11 AM By: Georges Mouse,  Kenia RN Entered By: Jeanine Luz on 11/07/2020 11:56:13 Felicia [name] ([id number]) -------------------------------------------------------------------------------- Wound  Assessment Details Patient Name: Felicia [name] Date of Service: 11/07/2020 10:45 AM Medical Record Number: [id number] Patient Account Number: [id number] Date of Birth/Sex: 1928-09-22 (87 y.o. F) Treating RN: Dolan Amen Primary Care Janat Tabbert: Thereasa Distance Other Clinician: Jeanine Luz Referring Marcea Rojek: Nolon Stalls Treating Kento Gossman/Extender: Skipper Cliche in Treatment: 0 Wound Status Wound Number: 5 Primary Trauma, Other Etiology: Wound Location: Right, Posterior Lower Leg Wound Open Wounding Event: Trauma Status: Date Acquired: 10/25/2020 Comorbid Anemia, Arrhythmia, Hypertension, Hypotension, End Weeks Of Treatment: 0 History: Stage Renal Disease, Osteoarthritis Clustered Wound: No Photos Wound Measurements Length: (cm) 1.5 Width: (cm) 1.5 Depth: (cm) 0.2 Area: (cm) 1.767 Volume: (cm) 0.353 % Reduction in Area: 0% % Reduction in Volume: 0% Epithelialization: None Tunneling: No Undermining: No Wound Description Classification: Full Thickness Without Exposed Support Structu Exudate Amount: Medium Exudate Type: Purulent Exudate Color: yellow, brown, green res Foul Odor After Cleansing: No Slough/Fibrino Yes Wound Bed Granulation Amount: Small (1-33%) Exposed Structure Granulation Quality: Red Fascia Exposed: No Necrotic Amount: Medium (34-66%) Fat Layer (Subcutaneous Tissue) Exposed: Yes Necrotic Quality: Adherent Slough Tendon Exposed: No Muscle Exposed: No Joint Exposed: No Bone Exposed: No Electronic Signature(s) Signed: 11/08/2020 8:01:22 AM By: Jeanine Luz Signed: 11/08/2020 9:05:11 AM By: Georges Mouse, Minus Breeding RN Entered By: Jeanine Luz on 11/07/2020 11:56:55 Hyndman, Felicia [name] ([id number]) -------------------------------------------------------------------------------- Vitals Details Patient Name: Felicia [name] Date of Service: 11/07/2020 10:45 AM Medical Record Number: [id number] Patient Account Number:  [id number] Date of Birth/Sex: [birth year] (87 y.o. F) Treating RN: Dolan Amen Primary Care Vallerie Hentz: Thereasa Distance Other Clinician: Jeanine Luz Referring Rodrigo Mcgranahan: Nolon Stalls Treating Juwan Vences/Extender: Skipper Cliche in Treatment: 0 Vital Signs Time Taken: 11:00 Temperature (F): 98.2 Pulse (bpm): 84 Respiratory Rate (breaths/min): 18 Blood Pressure (mmHg): 104/64 Reference Range: 80 - 120 mg / dl Electronic Signature(s) Signed: 11/08/2020 8:01:22 AM By: Jeanine Luz Entered By: Jeanine Luz on 11/07/2020 11:03:59

## 2020-11-10 ENCOUNTER — Telehealth: Payer: Self-pay | Admitting: *Deleted

## 2020-11-10 NOTE — Telephone Encounter (Signed)
Son [name] called to report that patient will not be at her appointment 12/05/22 as she passed away at 12:35 today

## 2020-11-13 ENCOUNTER — Ambulatory Visit: Payer: Medicare Other | Admitting: Physician Assistant

## 2020-11-13 LAB — AEROBIC/ANAEROBIC CULTURE W GRAM STAIN (SURGICAL/DEEP WOUND): Gram Stain: NONE SEEN

## 2020-11-15 ENCOUNTER — Ambulatory Visit: Payer: Medicare Other | Admitting: Hematology and Oncology

## 2020-11-15 ENCOUNTER — Ambulatory Visit: Payer: Medicare Other

## 2020-11-15 ENCOUNTER — Other Ambulatory Visit: Payer: Medicare Other

## 2020-12-01 DEATH — deceased

## 2020-12-11 ENCOUNTER — Ambulatory Visit: Payer: Medicare Other | Admitting: Family

## 2020-12-12 IMAGING — CR DG CHEST 2V
1 series · 3 of 3 positions shown · non-contrast
Comparison: 07/24/2020

CLINICAL DATA: Shortness of breath, edema

EXAM:
CHEST - 2 VIEW

[Series 1: dg chest 2 view · 0.14mm/px · 3 of 3 slices shown]
[im 1/3]
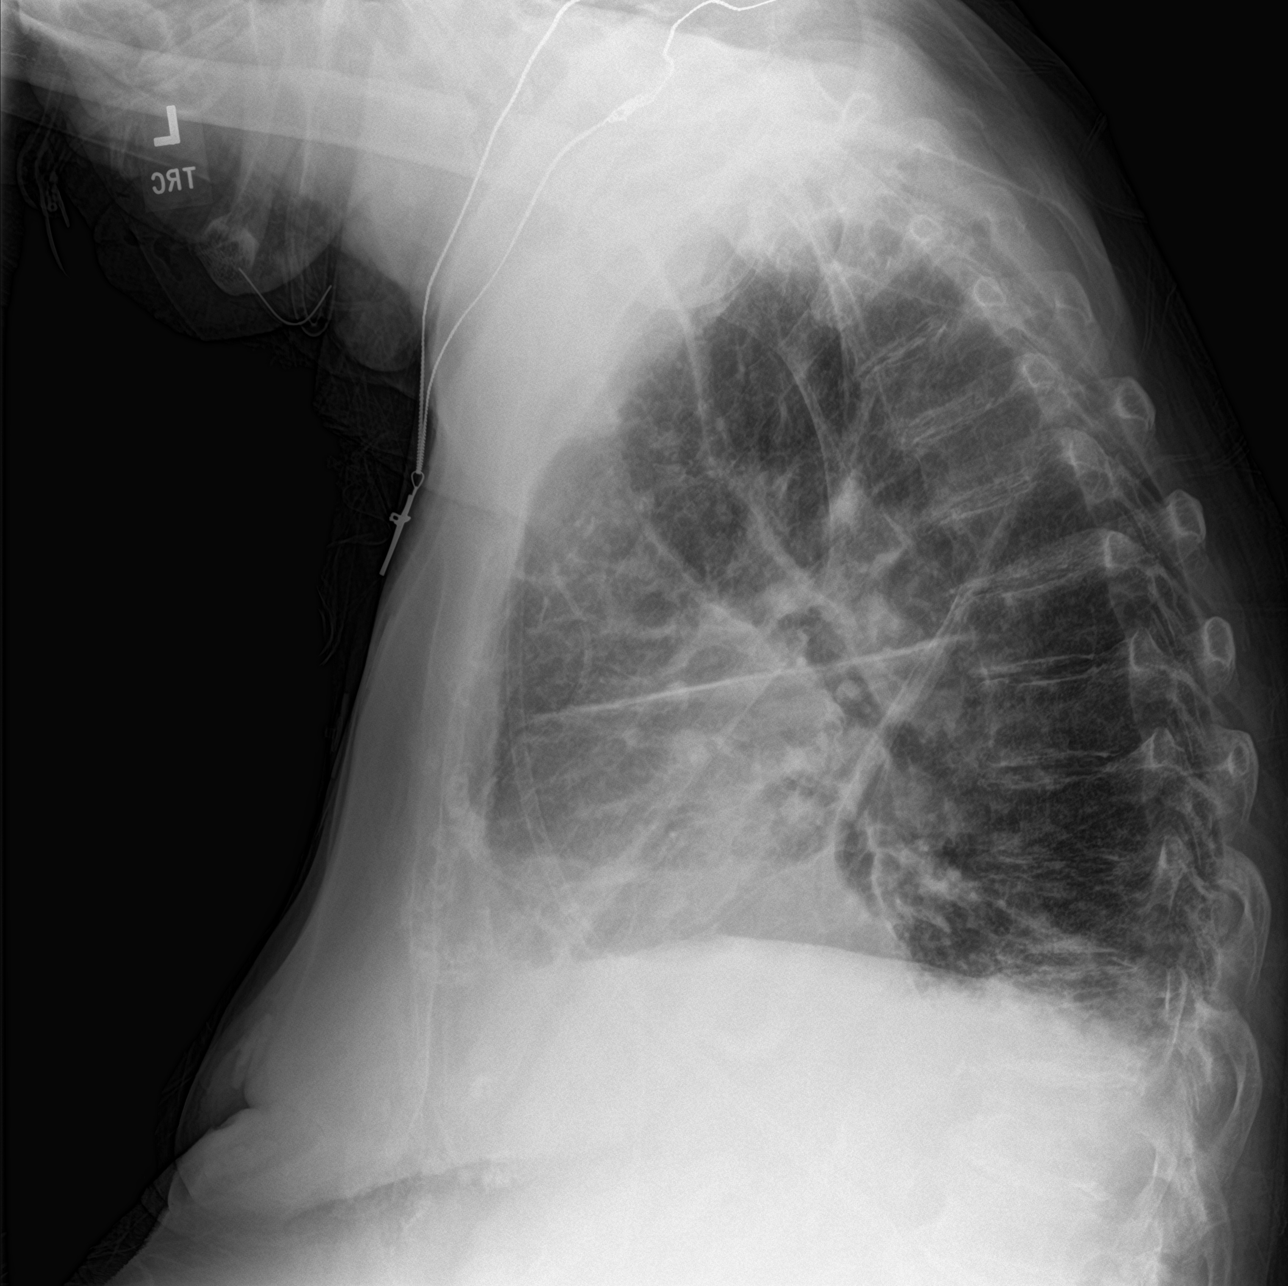
[im 2/3]
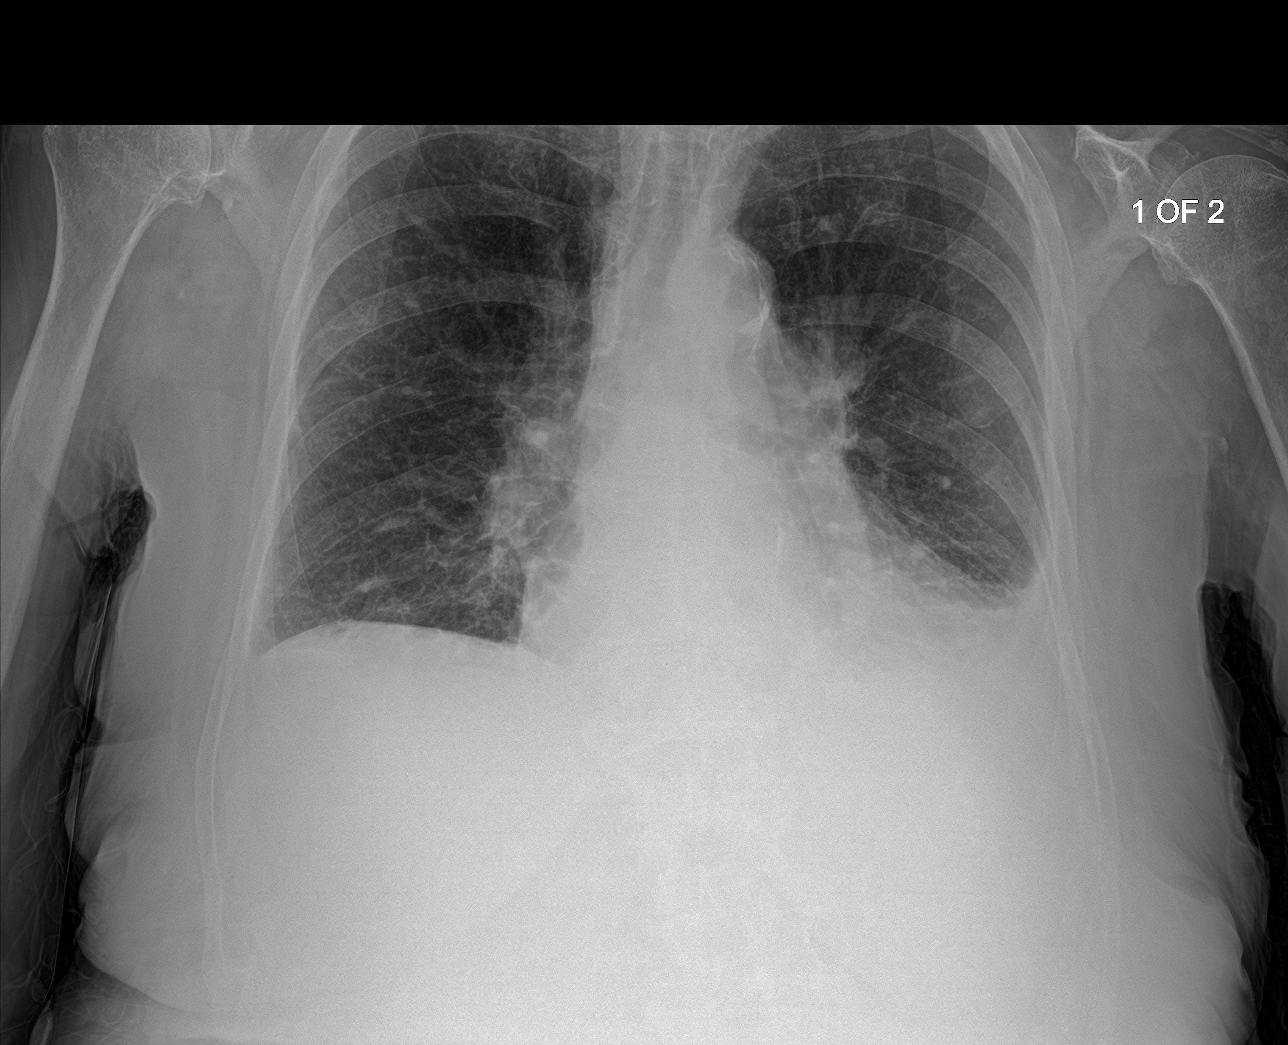
[im 3/3]
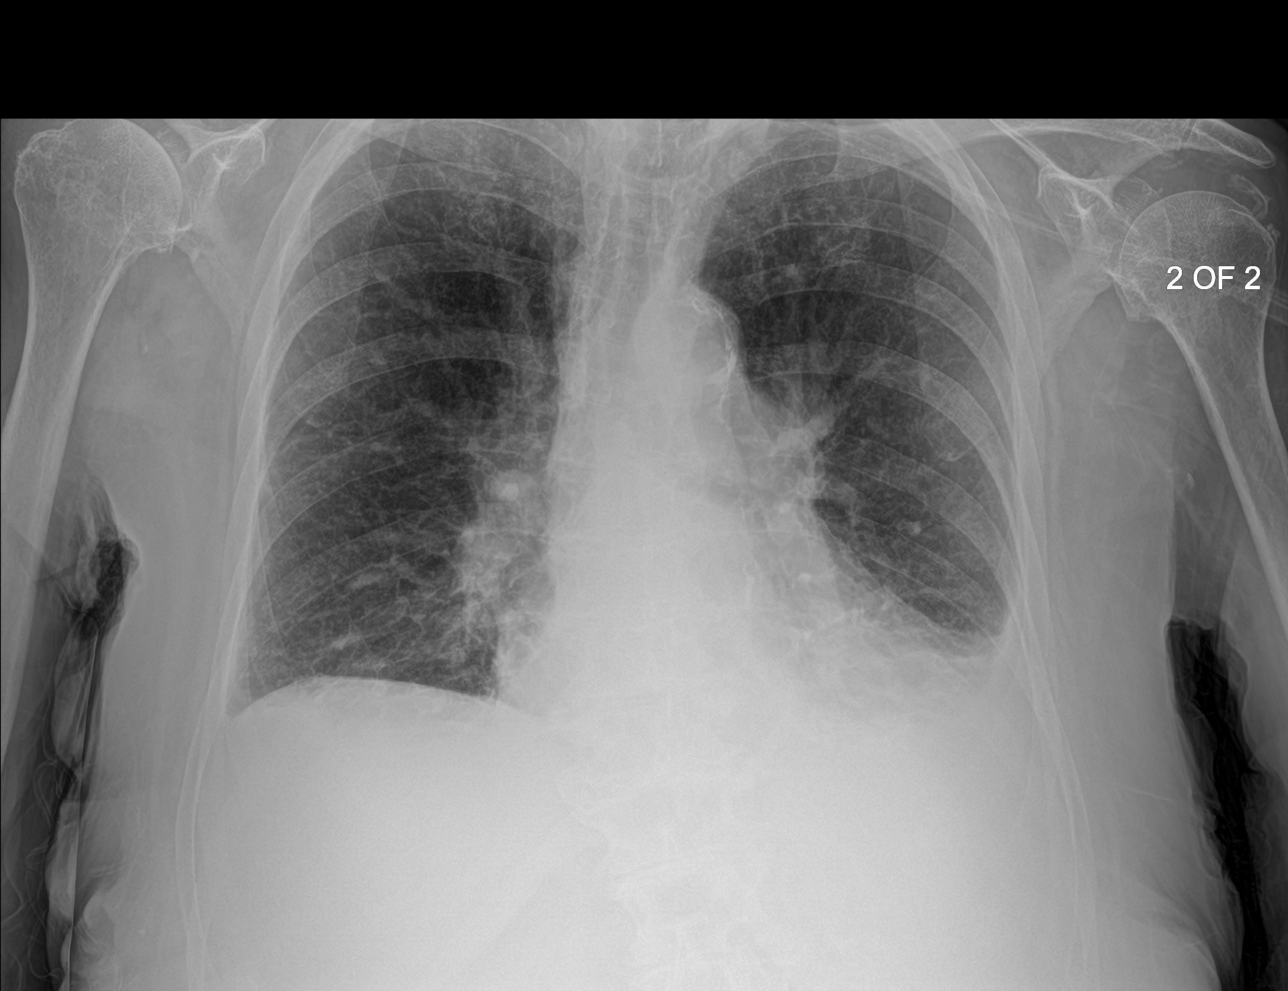

[3 of 3 positions shown; findings below may reference images not displayed]

FINDINGS: Frontal and lateral views of the chest demonstrate a stable enlarged
cardiac silhouette. There is persistent left basilar consolidation
and effusion, grossly stable since prior study. Decreased right
pleural effusion, with marked improvement in aeration at the right
lung base. No pneumothorax. No acute bony abnormalities.
IMPRESSION: 1. Stable left basilar consolidation and effusion.
2. Marked improved aeration at the right lung base, with only trace
residual right pleural effusion.

## 2020-12-18 ENCOUNTER — Ambulatory Visit: Payer: Medicare Other | Admitting: Family

## 2021-02-04 IMAGING — DX DG CHEST 1V PORT
1 series · 1 of 1 positions shown · non-contrast
Comparison: 08/01/2020

CLINICAL DATA: Worsening weakness and shortness of breath.

EXAM:
PORTABLE CHEST 1 VIEW

[chest ap]
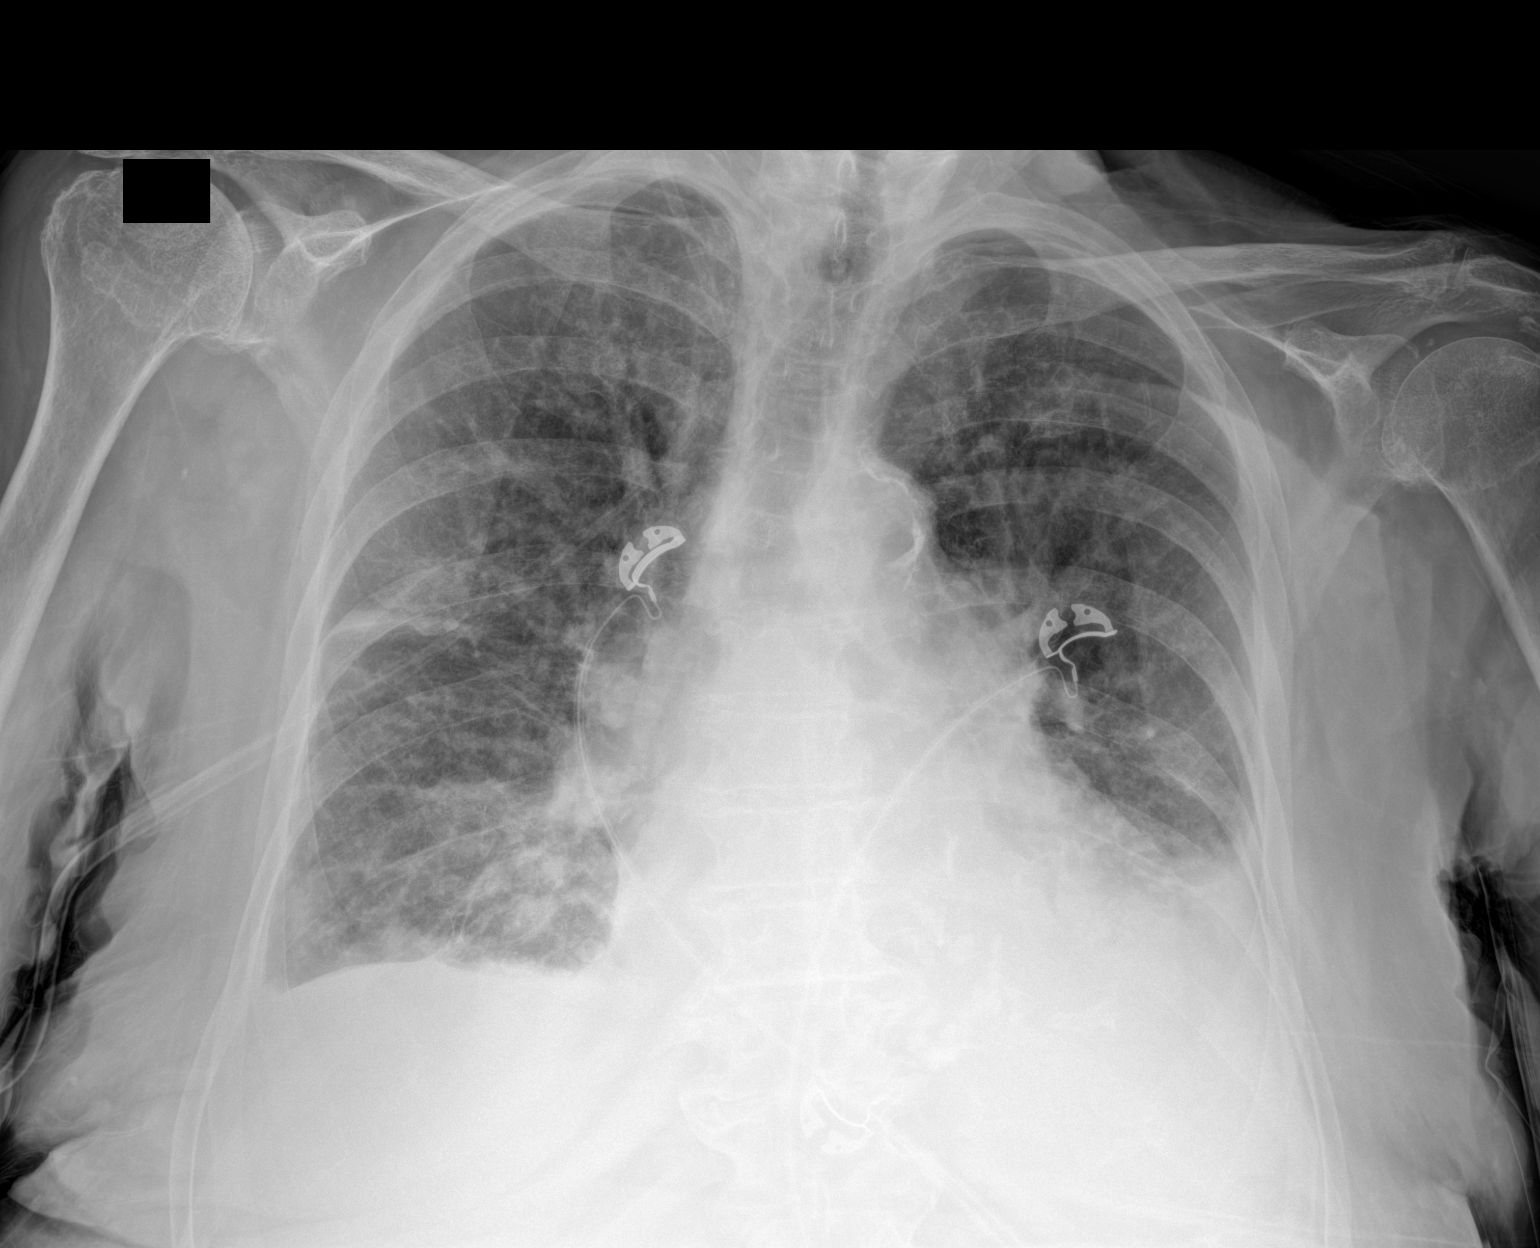

[1 of 1 positions shown; findings below may reference images not displayed]

FINDINGS: Cardiac enlargement. Diffuse pulmonary vascular congestion with
perihilar infiltration, likely edema. Bilateral pleural effusions
with basilar atelectasis, greater on the left. Progression since
previous study. Calcified and tortuous aorta. Degenerative changes
in the spine and shoulders.
IMPRESSION: Cardiac enlargement, pulmonary vascular congestion, perihilar edema,
and bilateral pleural effusions.

## 2021-03-14 IMAGING — US US EXTREM LOW VENOUS*R*
1 series · 13 of 24 positions shown · non-contrast
Comparison: None.

CLINICAL DATA: Right lower extremity edema and erythema.



[Series 1: us extrem low venous*right* · 13 of 45 slices shown]
[im 1/45]
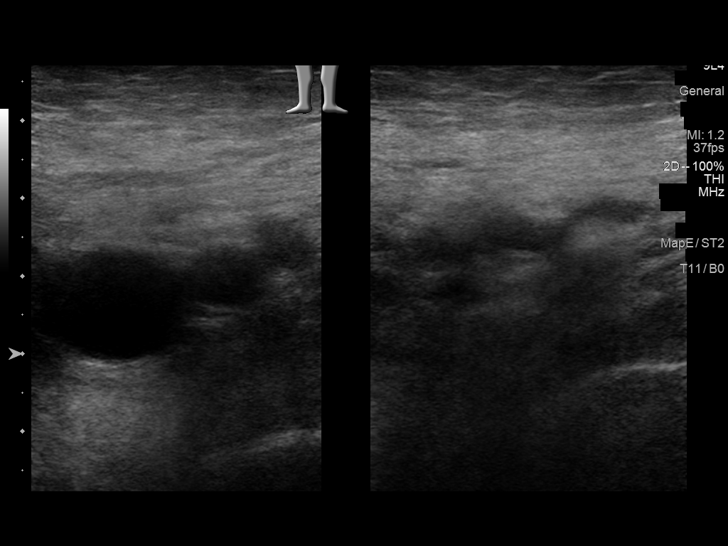
[im 4/45]
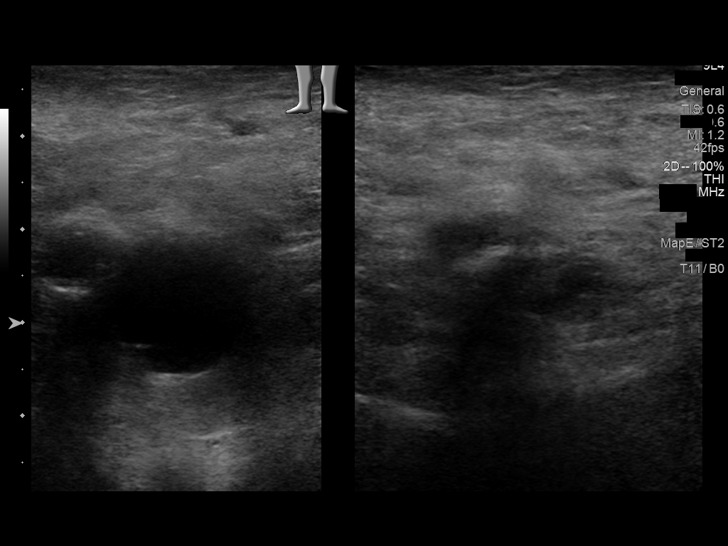
[im 8/45]
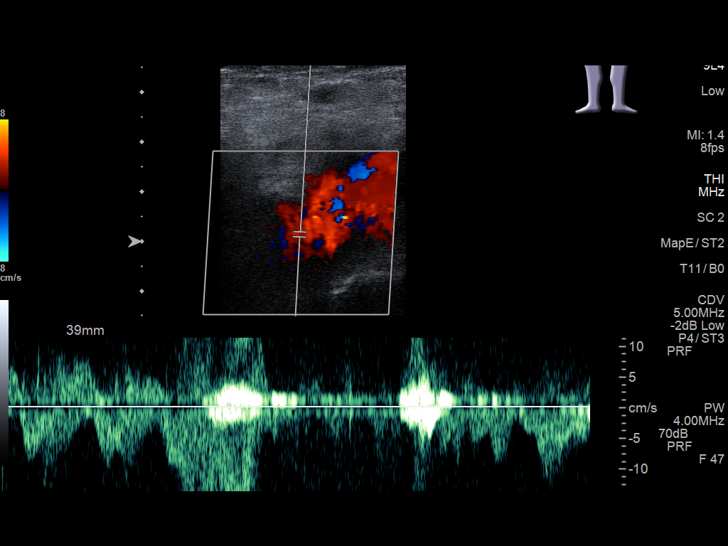
[im 12/45]
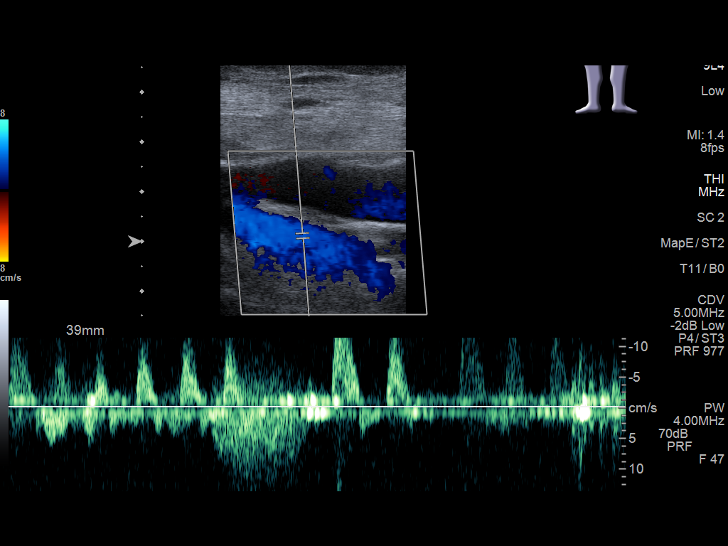
[im 16/45]
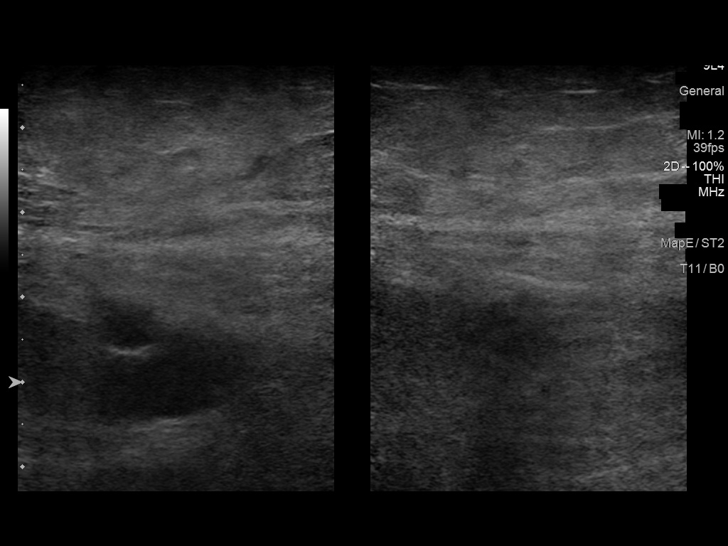
[im 20/45]
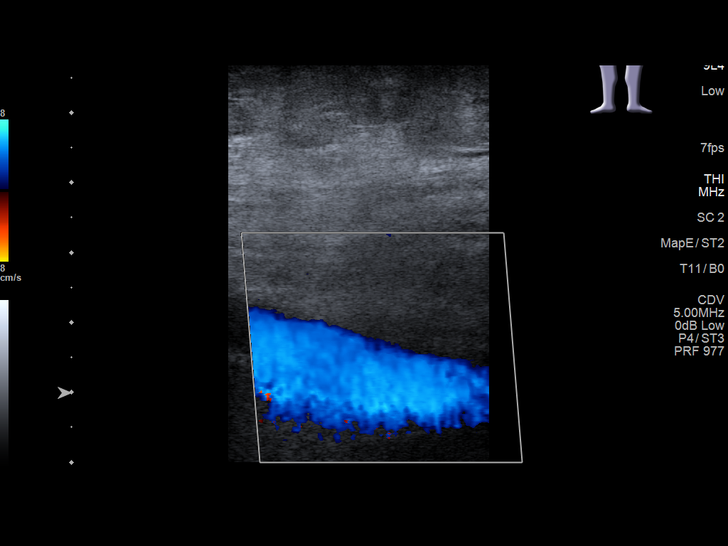
[im 23/45]
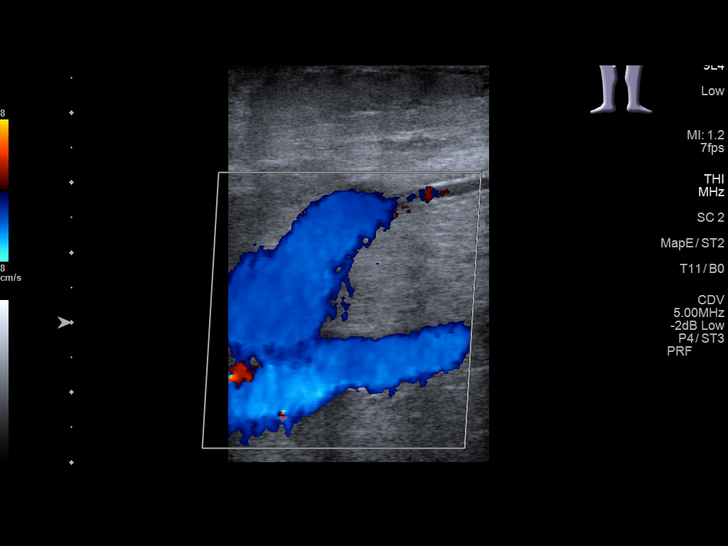
[im 25/45]
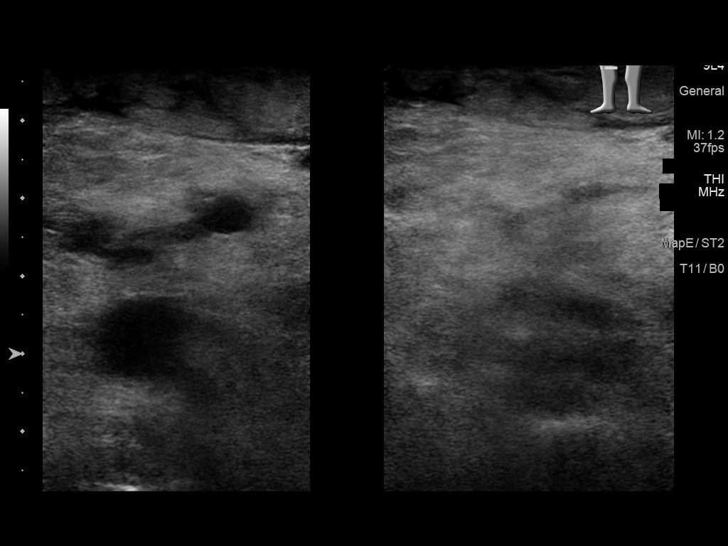
[im 29/45]
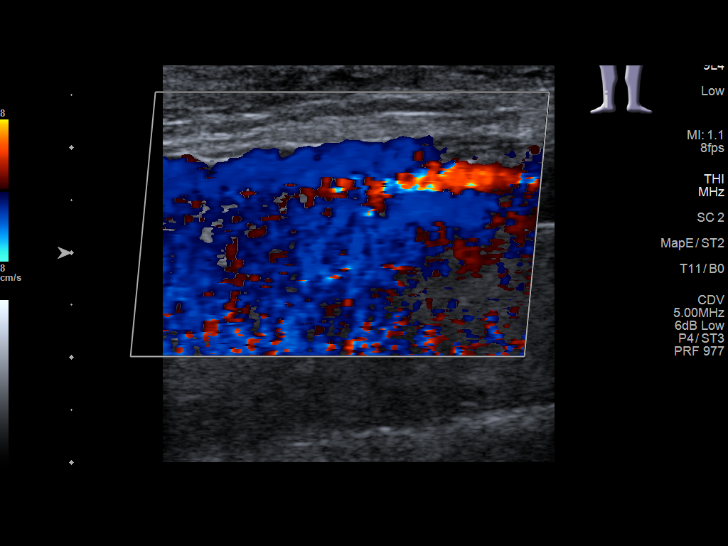
[im 33/45]
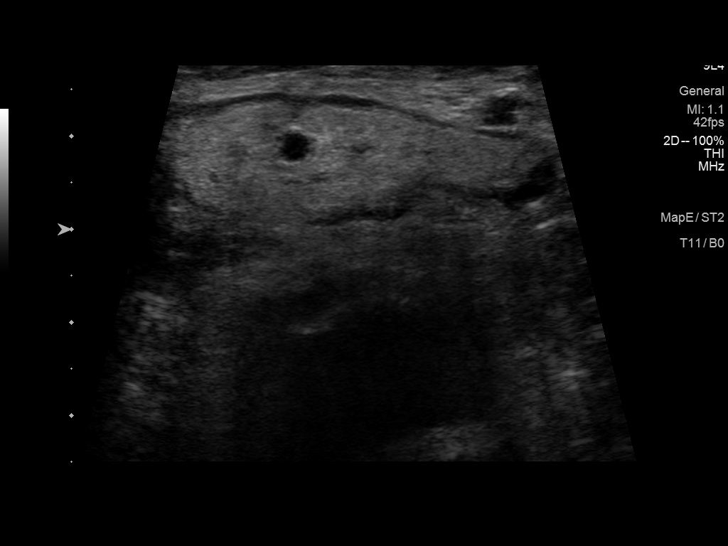
[im 37/45]
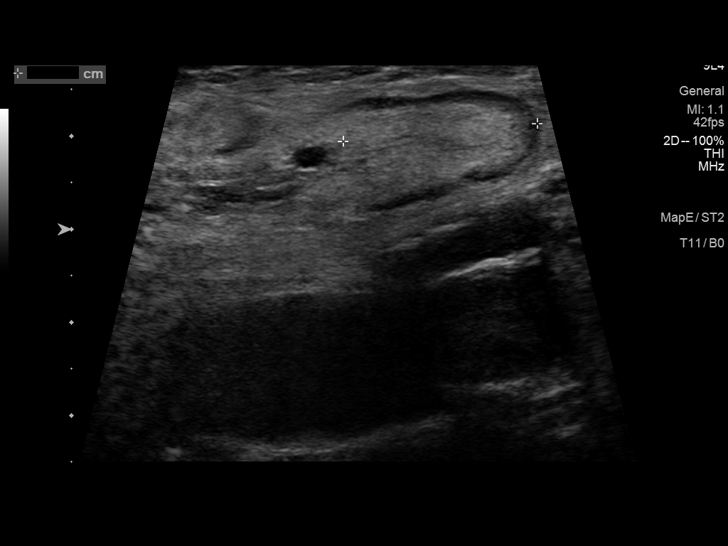
[im 41/45]
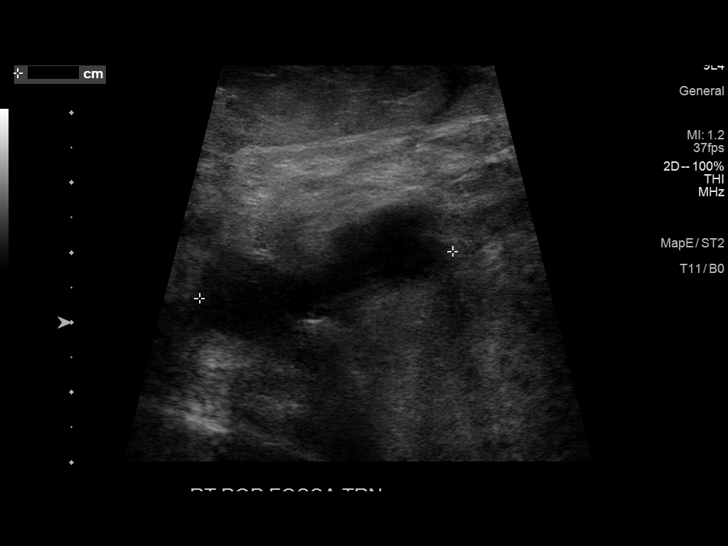
[im 45/45]
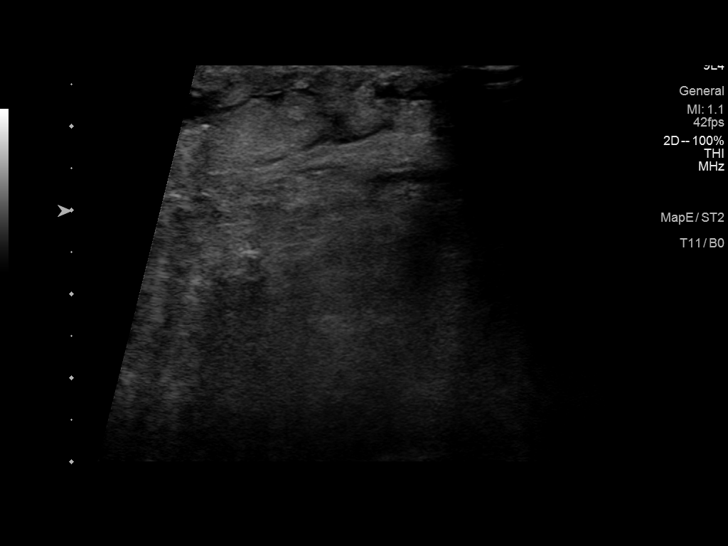

[13 of 24 positions shown; findings below may reference images not displayed]

FINDINGS: Contralateral Common Femoral Vein: Respiratory phasicity is normal
and symmetric with the symptomatic side. No evidence of thrombus.
Normal compressibility.

Common Femoral Vein: No evidence of thrombus. Normal
compressibility, respiratory phasicity and response to augmentation.

Saphenofemoral Junction: No evidence of thrombus. Normal
compressibility and flow on color Doppler imaging.

Profunda Femoral Vein: No evidence of thrombus. Normal
compressibility and flow on color Doppler imaging.

Femoral Vein: No evidence of thrombus. Normal compressibility,
respiratory phasicity and response to augmentation.

Popliteal Vein: No evidence of thrombus. Normal compressibility,
respiratory phasicity and response to augmentation.

Calf Veins: No evidence of thrombus. Normal compressibility and flow
on color Doppler imaging.

Superficial Great Saphenous Vein: No evidence of thrombus. Normal
compressibility.

Venous Reflux:  None.

Other Findings: Fluid collection in the right popliteal fossa
measuring up to 5 cm in greatest length most likely represents a
Baker's cyst. Mildly prominent right inguinal lymph node measures up
to 1.4 cm in short axis. This may be a reactive node.
IMPRESSION: 1. No evidence of right lower extremity deep vein thrombosis.
2. Right popliteal fossa Baker's cyst.
3. Mildly prominent right inguinal lymph node may be a reactive
lymph node.
